# Patient Record
Sex: Female | Born: 1980 | Race: Black or African American | Hispanic: No | Marital: Single | Smoking: Former smoker
Health system: Southern US, Community
[De-identification: ages and names within clinical notes are randomized; demographics above are authoritative.]

## PROBLEM LIST (undated history)

## (undated) DIAGNOSIS — F419 Anxiety disorder, unspecified: Secondary | ICD-10-CM

## (undated) DIAGNOSIS — F32A Depression, unspecified: Secondary | ICD-10-CM

## (undated) DIAGNOSIS — I10 Essential (primary) hypertension: Secondary | ICD-10-CM

## (undated) DIAGNOSIS — M1990 Unspecified osteoarthritis, unspecified site: Secondary | ICD-10-CM

## (undated) DIAGNOSIS — D649 Anemia, unspecified: Secondary | ICD-10-CM

## (undated) DIAGNOSIS — K219 Gastro-esophageal reflux disease without esophagitis: Secondary | ICD-10-CM

## (undated) DIAGNOSIS — G4730 Sleep apnea, unspecified: Secondary | ICD-10-CM

## (undated) HISTORY — PX: WISDOM TOOTH EXTRACTION: SHX21

## (undated) HISTORY — PX: CHOLECYSTECTOMY: SHX55

## (undated) HISTORY — PX: TUBAL LIGATION: SHX77

---

## 2004-01-30 ENCOUNTER — Emergency Department: Payer: Self-pay | Admitting: Emergency Medicine

## 2004-02-10 ENCOUNTER — Emergency Department: Payer: Self-pay | Admitting: Emergency Medicine

## 2004-04-25 ENCOUNTER — Emergency Department: Payer: Self-pay | Admitting: Emergency Medicine

## 2004-05-12 ENCOUNTER — Inpatient Hospital Stay: Payer: Self-pay | Admitting: Obstetrics and Gynecology

## 2005-09-05 ENCOUNTER — Emergency Department: Payer: Self-pay | Admitting: Emergency Medicine

## 2005-09-16 ENCOUNTER — Emergency Department (HOSPITAL_COMMUNITY): Admission: EM | Admit: 2005-09-16 | Discharge: 2005-09-16 | Payer: Self-pay | Admitting: Emergency Medicine

## 2006-02-05 ENCOUNTER — Encounter: Payer: Self-pay | Admitting: Maternal & Fetal Medicine

## 2006-02-19 ENCOUNTER — Encounter: Payer: Self-pay | Admitting: Maternal & Fetal Medicine

## 2006-02-23 ENCOUNTER — Emergency Department: Payer: Self-pay | Admitting: Emergency Medicine

## 2006-02-24 ENCOUNTER — Other Ambulatory Visit: Payer: Self-pay

## 2006-03-05 ENCOUNTER — Encounter: Payer: Self-pay | Admitting: Maternal & Fetal Medicine

## 2006-06-30 ENCOUNTER — Observation Stay: Payer: Self-pay

## 2006-07-22 ENCOUNTER — Inpatient Hospital Stay: Payer: Self-pay

## 2006-10-14 ENCOUNTER — Ambulatory Visit: Payer: Self-pay | Admitting: Vascular Surgery

## 2007-05-24 ENCOUNTER — Other Ambulatory Visit: Payer: Self-pay

## 2007-05-24 ENCOUNTER — Emergency Department: Payer: Self-pay | Admitting: Emergency Medicine

## 2007-05-26 ENCOUNTER — Emergency Department: Payer: Self-pay | Admitting: Emergency Medicine

## 2008-07-22 ENCOUNTER — Emergency Department: Payer: Self-pay | Admitting: Emergency Medicine

## 2008-07-23 ENCOUNTER — Emergency Department: Payer: Self-pay | Admitting: Emergency Medicine

## 2008-10-07 ENCOUNTER — Emergency Department: Payer: Self-pay | Admitting: Emergency Medicine

## 2009-11-01 ENCOUNTER — Ambulatory Visit: Payer: Self-pay | Admitting: Internal Medicine

## 2010-01-03 ENCOUNTER — Encounter: Payer: Self-pay | Admitting: Rheumatology

## 2010-01-06 ENCOUNTER — Encounter: Payer: Self-pay | Admitting: Rheumatology

## 2010-02-06 ENCOUNTER — Encounter: Payer: Self-pay | Admitting: Rheumatology

## 2010-08-02 ENCOUNTER — Emergency Department: Payer: Self-pay | Admitting: Emergency Medicine

## 2011-01-03 ENCOUNTER — Emergency Department: Payer: Self-pay | Admitting: Emergency Medicine

## 2011-06-24 ENCOUNTER — Emergency Department: Payer: Self-pay | Admitting: Emergency Medicine

## 2011-06-24 LAB — URINALYSIS, COMPLETE
Blood: NEGATIVE
Glucose,UR: NEGATIVE mg/dL (ref 0–75)
Ph: 5 (ref 4.5–8.0)
Protein: 30
RBC,UR: 5 /HPF (ref 0–5)
Specific Gravity: 1.026 (ref 1.003–1.030)
Squamous Epithelial: NONE SEEN
WBC UR: 29 /HPF (ref 0–5)

## 2011-06-24 LAB — COMPREHENSIVE METABOLIC PANEL
Albumin: 3.4 g/dL (ref 3.4–5.0)
BUN: 7 mg/dL (ref 7–18)
Bilirubin,Total: 0.2 mg/dL (ref 0.2–1.0)
Calcium, Total: 9 mg/dL (ref 8.5–10.1)
Co2: 22 mmol/L (ref 21–32)
EGFR (Non-African Amer.): >60
Glucose: 85 mg/dL (ref 65–99)
Potassium: 3.4 mmol/L — ABNORMAL LOW (ref 3.5–5.1)
Total Protein: 7.4 g/dL (ref 6.4–8.2)

## 2011-06-24 LAB — CBC
HCT: 33.8 % — ABNORMAL LOW (ref 35.0–47.0)
MCH: 28.6 pg (ref 26.0–34.0)
MCHC: 33.2 g/dL (ref 32.0–36.0)
MCV: 86 fL (ref 80–100)
RDW: 14.2 % (ref 11.5–14.5)
WBC: 8 10*3/uL (ref 3.6–11.0)

## 2011-07-25 ENCOUNTER — Emergency Department: Payer: Self-pay | Admitting: *Deleted

## 2011-07-25 LAB — URINALYSIS, COMPLETE
Bilirubin,UR: NEGATIVE
Blood: NEGATIVE
Glucose,UR: NEGATIVE mg/dL (ref 0–75)
Protein: NEGATIVE
Specific Gravity: 1.02 (ref 1.003–1.030)

## 2011-11-15 ENCOUNTER — Emergency Department: Payer: Self-pay | Admitting: Emergency Medicine

## 2011-12-11 ENCOUNTER — Inpatient Hospital Stay: Payer: Self-pay | Admitting: Obstetrics and Gynecology

## 2011-12-11 LAB — PIH PROFILE
Anion Gap: 10 (ref 7–16)
Co2: 21 mmol/L (ref 21–32)
EGFR (African American): >60
MCH: 29.4 pg (ref 26.0–34.0)
MCHC: 34 g/dL (ref 32.0–36.0)
Platelet: 253 10*3/uL (ref 150–440)
RDW: 14.3 % (ref 11.5–14.5)
Uric Acid: 3.9 mg/dL (ref 2.6–6.0)

## 2011-12-11 LAB — PROTEIN / CREATININE RATIO, URINE
Protein, Random Urine: 71 mg/dL — ABNORMAL HIGH (ref 0–12)
Protein/Creat. Ratio: 288 mg/gCREAT — ABNORMAL HIGH (ref 0–200)

## 2011-12-12 LAB — CBC WITH DIFFERENTIAL/PLATELET
Basophil %: 0.2 %
Eosinophil %: 1.9 %
HGB: 10.8 g/dL — ABNORMAL LOW (ref 12.0–16.0)
Lymphocyte #: 2.3 10*3/uL (ref 1.0–3.6)
MCH: 28.6 pg (ref 26.0–34.0)
MCHC: 33 g/dL (ref 32.0–36.0)
MCV: 87 fL (ref 80–100)
Monocyte #: 0.4 x10 3/mm (ref 0.2–0.9)
RBC: 3.77 10*6/uL — ABNORMAL LOW (ref 3.80–5.20)

## 2011-12-14 LAB — PATHOLOGY REPORT

## 2013-08-28 ENCOUNTER — Emergency Department: Payer: Self-pay | Admitting: Emergency Medicine

## 2014-02-09 ENCOUNTER — Emergency Department: Payer: Self-pay | Admitting: Emergency Medicine

## 2014-05-26 NOTE — Op Note (Signed)
PATIENT NAME:  Anna Barker, Anna Barker MR#:  [id number] DATE OF BIRTH:  06-28-1980  DATE OF PROCEDURE:  12/12/2011  PREOPERATIVE DIAGNOSES:  1. Intrauterine pregnancy at 10336 weeks gestational age.  2. Oligohydramnios with AFI of 3 cm.  3. Chronic hypertension. 4. Desires permanent sterilization.   POSTOPERATIVE DIAGNOSES:  1. Intrauterine pregnancy at 6436 weeks gestational age.  2. Oligohydramnios with AFI of 3 cm.  3. Chronic hypertension. 4. Desires permanent sterilization.   PROCEDURES:  1. Primary low transverse cesarean section via Pfannenstiel incision.  2. Bilateral tubal ligation via Parkland method.   SURGEON: Thomasene MohairStephen Khameron Gruenwald, MD  ASSISTANT: Senaida LangeLashawn Weaver Lee, MD  ANESTHESIA: Spinal.   ESTIMATED BLOOD LOSS: 500 mL.  OPERATIVE FLUIDS: 2000 mL crystalloid.   COMPLICATIONS: None.   FINDINGS:  1. Normal appearing gravid uterus, fallopian tubes, and ovaries.  2. Viable female infant in breech presentation.   SPECIMENS: Portion of right and left fallopian tubes.   CONDITION AT END OF PROCEDURE: Stable.   INDICATION FOR PROCEDURE: [name] is a 34 year old 34, P2-0-2-2 female who has [facility] due to a history of chronic hypertension. For this she was receiving regular ultrasounds with growth scans as well as amnio-fluid index levels. The day before the surgery, she was noted to have an AFI of approximately 3 cm. She was sent to the hospital for further monitoring. Her blood pressures were somewhat elevated at first but then leveled off to her normal level. However, after hydration overnight, her  AFI remained approximately the same, at 2.6 cm upon recheck today. Given her oligohydramnios and chronic hypertension and her gestational age of [redacted] weeks, it was decided that she should be delivered. Given her breech presentation, discussion was held regarding external cephalic version. Decision was made not to proceed and she was therefore  taken to the operating room for abdominal delivery.   PROCEDURE IN DETAIL: After the procedure was reviewed with the patient and the risks and benefits of the procedure were explained, questions were answered. Additionally, the patient wished to have a tubal ligation and the risks and benefits as well as alternatives were discussed with the patient and she wished to proceed. She therefore was taken to the operating room. In the operating room, she was administered spinal anesthesia which was found to be adequate. She was placed in the dorsal supine position with a leftward tilt. She was prepped and draped in the usual sterile fashion. After time out was called, a Pfannenstiel incision was made and carried through the various layers until the peritoneum was identified and entered sharply. The peritoneum was extended in cranial and caudal directions. A bladder flap was then created and the bladder retractor was placed to pull the bladder out of the operative area of interest. A low transverse hysterotomy was then made and was extended laterally with both cranial and caudal tension. The fetal breech was then grasped and delivered in the usual fashion using a moist blue towel and the body followed by the shoulders and the head delivered without difficulty. The cord was clamped and cut and the infant was handed to the awaiting pediatrician. Cord blood was collected. The placenta was then removed and the uterus was cleared of all clots and debris. The uterus was exteriorized at this point. The hysterotomy was closed using a #0 Vicryl in a running locked fashion. A second layer of the same suture was used to obtain hemostasis.   Attention was turned to the right  fallopian tube where a 3 cm segment, approximately 3 cm from the cornual region, was identified, grasped with a Babcock clamp, was suture ligated, and approximately a 3 cm segment was removed. Hemostasis was noted. Of note, two ties using #0 plain gut were used  to achieve hemostasis. The Parkland method was used for the tubal ligation. The same procedure was carried out on the left fallopian tube with the same results and hemostasis was noted. The incision was again reinspected and then the uterus was returned to the abdomen. The gutters were cleared of all clots and debris. The peritoneum was then closed using a large, loosely tied figure-of-eight with a #2-0 Vicryl.   After ensuring hemostasis on the rectus abdominis muscle bellies, the On-Q system was then placed according to the manufacturer's specifications. The catheters were inserted approximately 4 cm cephalad to the incision and approximately 2 cm apart straddling the midline. They were inserted to a level just superficial to the rectus abdominis muscles and just deep to the rectus abdominis fascia. They were inserted to a level of the third mark on the catheters. They were then placed in the abdominal muscle bellies as to be avoided during closure of the fascia.   Fascia was then closed using #0 Maxon using two separate sutures both starting at the lateral apices and meeting in the midline and were tied together in the midline. The skin was closed using #4-0 Vicryl, undyed, in a running subcuticular fashion. It was covered with Steri-Strips and benzoin and a pressure dressing. The On-Q catheters were affixed to the abdomen using Dermabond and Steri-Strips as well as Tegaderm.   The patient tolerated the procedure well. Sponge, lap, and needle counts were correct x2. For VTE prophylaxis, the patient was wearing pneumatic compression stockings which were in place and operating throughout the entire procedure. For antibiotic prophylaxis, she received 3 grams of Ancef prior to skin incision.  ____________________________ Conard Novak, MD sdj:slb D: 12/12/2011 18:03:41 ET T: 12/13/2011 08:52:07 ET JOB#: [id number]  cc: Conard Novak, MD, <Dictator> Conard Novak MD ELECTRONICALLY SIGNED  12/27/2011 15:46

## 2014-06-16 NOTE — H&P (Signed)
L&D Evaluation:  History:   HPI 34 year old BF G4 P2022 with EDC=01/06/2012 by LMP=08/07/2011 presents from office at 36 2/7 weeks with elevated blood pressures in office of 156/106 and frontal headache. Patient has CHTN and is currently on Aldomet 250 mgm BID. An ultrasound today revealed a AFI of 3.12cm and baby is in breech presentation. She presents for a PIH evaluation and IV hydration. She has been having migraine type headaches that have been somewhat relieved with Fioricet. She has been having AM nausea and vomiting. Her vision blurs with her headaches. Denies CP, SOB, VB, LOF. Having mild irregular contractions. Floowed with weekly NSTs which have been reactive and AFIs that were normal until today. One pound weight loss since last week. Has gained more than31# with pregnancy. EFW on 10/10 was in the 43.4%    Presents with elevated BP, headache, oligo, and breech presentation    Patient's Medical History Hypertension  migraines, reflux, abnormal Pap smears.    Patient's Surgical History Cholecystectomy    Medications Pre Natal Vitamins  Tylenol (Acetaminophen)  Fioricet, Aldomet 250 mgm BID, pantoprazole 40 mgm  daily    Allergies NKDA    Social History none    Family History Non-Contributory   ROS:   ROS see HPI   Exam:   Vital Signs 131/84, 157/85, 110/86, 143/88, 151/106, 148/74    Urine Protein P/C ratio=288mg m    General uncomfortable with back pain and headache    Mental Status clear    Chest clear    Heart normal sinus rhythm, no murmur/gallop/rubs    Abdomen gravid, non-tender    Estimated Fetal Weight Average for gestational age    Fetal Position breech    Edema 1+    Reflexes 1+    Mebranes Intact    FHT normal rate with no decels, 135 with accels to 160    Ucx irregular, mild    Other PIH labs: BUN/cr=7/0.39, SGOT WNL, uric acid=3.9cm, H&H=11.7/34.3%, plt=253K   Impression:   Impression IUP at 36 + weeks with oligo. Breech presentation. CHTN  with no evidence of preeclampsia.   Plan:   Plan EFM/NST, monitor BP, IV hydration and repeat AFI in AM. Tylenol for pain. Regular diet.   Electronic Signatures: Trinna BalloonGutierrez, Elman Dettman L (CNM)  (Signed [phone]-Nov-13 01:06)  Authored: L&D Evaluation   Last Updated: 05-Nov-13 01:06 by Trinna BalloonGutierrez, Hjalmer Iovino L (CNM)

## 2014-10-17 ENCOUNTER — Encounter: Payer: Self-pay | Admitting: *Deleted

## 2014-10-17 ENCOUNTER — Emergency Department
Admission: EM | Admit: 2014-10-17 | Discharge: 2014-10-17 | Discharge status: Against Medical Advice | Payer: Self-pay | Attending: Emergency Medicine | Admitting: Emergency Medicine

## 2014-10-17 DIAGNOSIS — I10 Essential (primary) hypertension: Secondary | ICD-10-CM | POA: Insufficient documentation

## 2014-10-17 DIAGNOSIS — S61012A Laceration without foreign body of left thumb without damage to nail, initial encounter: Secondary | ICD-10-CM | POA: Insufficient documentation

## 2014-10-17 DIAGNOSIS — Y288XXA Contact with other sharp object, undetermined intent, initial encounter: Secondary | ICD-10-CM | POA: Insufficient documentation

## 2014-10-17 DIAGNOSIS — Y9389 Activity, other specified: Secondary | ICD-10-CM | POA: Insufficient documentation

## 2014-10-17 DIAGNOSIS — Y9289 Other specified places as the place of occurrence of the external cause: Secondary | ICD-10-CM | POA: Insufficient documentation

## 2014-10-17 DIAGNOSIS — Y990 Civilian activity done for income or pay: Secondary | ICD-10-CM | POA: Insufficient documentation

## 2014-10-17 HISTORY — DX: Essential (primary) hypertension: I10

## 2014-10-17 NOTE — ED Notes (Signed)
Pt was at work and cut the pad of her thumb on her left hand with a blade. Bleeding controlled on arrival to triage.

## 2014-10-17 NOTE — ED Notes (Signed)
Pt does not want to file workers comp.  

## 2014-12-03 ENCOUNTER — Emergency Department
Admission: EM | Admit: 2014-12-03 | Discharge: 2014-12-03 | Disposition: A | Discharge status: Home / Self Care | Payer: Self-pay | Attending: Emergency Medicine | Admitting: Emergency Medicine

## 2014-12-03 ENCOUNTER — Encounter: Payer: Self-pay | Admitting: Emergency Medicine

## 2014-12-03 ENCOUNTER — Emergency Department: Payer: Self-pay

## 2014-12-03 DIAGNOSIS — S8392XA Sprain of unspecified site of left knee, initial encounter: Secondary | ICD-10-CM | POA: Insufficient documentation

## 2014-12-03 DIAGNOSIS — Y9389 Activity, other specified: Secondary | ICD-10-CM | POA: Insufficient documentation

## 2014-12-03 DIAGNOSIS — I10 Essential (primary) hypertension: Secondary | ICD-10-CM | POA: Insufficient documentation

## 2014-12-03 DIAGNOSIS — Y990 Civilian activity done for income or pay: Secondary | ICD-10-CM | POA: Insufficient documentation

## 2014-12-03 DIAGNOSIS — Y9289 Other specified places as the place of occurrence of the external cause: Secondary | ICD-10-CM | POA: Insufficient documentation

## 2014-12-03 DIAGNOSIS — E663 Overweight: Secondary | ICD-10-CM | POA: Insufficient documentation

## 2014-12-03 DIAGNOSIS — W228XXA Striking against or struck by other objects, initial encounter: Secondary | ICD-10-CM | POA: Insufficient documentation

## 2014-12-03 MED ORDER — MELOXICAM 15 MG PO TABS
15.0000 mg | ORAL_TABLET | Freq: Every day | ORAL | Status: DC
Start: 2014-12-03 — End: 2015-05-28

## 2014-12-03 MED ORDER — NAPROXEN 500 MG PO TABS
500.0000 mg | ORAL_TABLET | Freq: Once | ORAL | Status: AC
  Administered 2014-12-03: 500 mg via ORAL
  Filled 2014-12-03: qty 1

## 2014-12-03 MED ORDER — TRAMADOL HCL 50 MG PO TABS: 50.0000 mg | ORAL_TABLET | Freq: Four times a day (QID) | ORAL | Status: DC | PRN

## 2014-12-03 NOTE — Discharge Instructions (Signed)
Wear knee support for 3-5 days as needed. How to Use a Knee Brace A knee brace is a device that you wear to support your knee, especially if the knee is healing after an injury or surgery. There are several types of knee braces. Some are designed to prevent an injury (prophylactic brace). These are often worn during sports. Others support an injured knee (functional brace) or keep it still while it heals (rehabilitative brace). People with severe arthritis of the knee may benefit from a brace that takes some pressure off the knee (unloader brace). Most knee braces are made from a combination of cloth and metal or plastic.  You may need to wear a knee brace to:  Relieve knee pain.  Help your knee support your weight (improve stability).  Help you walk farther (improve mobility).  Prevent injury.  Support your knee while it heals from surgery or from an injury. RISKS AND COMPLICATIONS Generally, knee braces are very safe to wear. However, problems may occur, including:  Skin irritation that may lead to infection.  Making your condition worse if you wear the brace in the wrong way. HOW TO USE A KNEE BRACE Different braces will have different instructions for use. Your health care provider will tell you or show you:  How to put on your brace.  How to adjust the brace.  When and how often to wear the brace.  How to remove the brace.  If you will need any assistive devices in addition to the brace, such as crutches or a cane. In general, your brace should:  Have the hinge of the brace line up with the bend of your knee.  Have straps, hooks, or tapes that fasten snugly around your leg.  Not feel too tight or too loose. HOW TO CARE FOR A KNEE BRACE  Check your brace often for signs of damage, such as loose connections or attachments. Your knee brace may get damaged or wear out during normal use.  Wash the fabric parts of your brace with soap and water.  Read the insert that  comes with your brace for other specific care instructions. SEEK MEDICAL CARE IF:  Your knee brace is too loose or too tight and you cannot adjust it.  Your knee brace causes skin redness, swelling, bruising, or irritation.  Your knee brace is not helping.  Your knee brace is making your knee pain worse.   This information is not intended to replace advice given to you by your health care provider. Make sure you discuss any questions you have with your health care provider.   Document Released: 04/15/2003 Document Revised: 10/14/2014 Document Reviewed: 05/18/2014 Elsevier Interactive Patient Education [employer]2016 Elsevier Inc.

## 2014-12-03 NOTE — ED Provider Notes (Signed)
[facility] Emergency Department Provider Note  ____________________________________________  Time seen: Approximately 6:36 PM  I have reviewed the triage vital signs and the nursing notes.   HISTORY  Chief Complaint Knee Pain    HPI Anna Barker is a 34 y.o. female patient complaining of left knee pain and edema secondary to a twisting incident last night at work. States she was sitting in a chair, she rolled the chair back and ankle hit a sharp object causing her to leave from a chair twisted her left knee. Patient states today there is pain which increased with ambulation and more edema is present. No palliative measures taken for this complaint. States is rating the pain as 8/10.   Past Medical History  Diagnosis Date  . Hypertension     There are no active problems to display for this patient.   Past Surgical History  Procedure Laterality Date  . Cholecystectomy      No current outpatient prescriptions on file.  Allergies Review of patient's allergies indicates no known allergies.  No family history on file.  Social History Social History  Substance Use Topics  . Smoking status: Never Smoker   . Smokeless tobacco: None  . Alcohol Use: No    Review of Systems Constitutional: No fever/chills Eyes: No visual changes. ENT: No sore throat. Cardiovascular: Denies chest pain. Respiratory: Denies shortness of breath. Gastrointestinal: No abdominal pain.  No nausea, no vomiting.  No diarrhea.  No constipation. Genitourinary: Negative for dysuria. Musculoskeletal: Left knee pain and edema. Skin: Negative for rash. Neurological: Negative for headaches, focal weakness or numbness. Endocrine: Hypertension  10-point ROS otherwise negative.  ____________________________________________   PHYSICAL EXAM:  VITAL SIGNS: ED Triage Vitals  Enc Vitals Group     BP 12/03/14 1822 157/97 mmHg     Pulse Rate 12/03/14 1822 91     Resp  12/03/14 1822 15     Temp 12/03/14 1822 98.4 F (36.9 C)     Temp Source 12/03/14 1822 Oral     SpO2 12/03/14 1822 99 %     Weight 12/03/14 1812 260 lb (117.935 kg)     Height 12/03/14 1812 5\' 7"  (1.702 m)     Head Cir --      Peak Flow --      Pain Score 12/03/14 1812 8     Pain Loc --      Pain Edu? --      Excl. in GC? --     Constitutional: Alert and oriented. Well appearing and in no acute distress. Ovewrweight. Eyes: Conjunctivae are normal. PERRL. EOMI. Head: Atraumatic. Nose: No congestion/rhinnorhea. Mouth/Throat: Mucous membranes are moist.  Oropharynx non-erythematous. Neck: No stridor.  No cervical spine tenderness to palpation. Hematological/Lymphatic/Immunilogical: No cervical lymphadenopathy. Cardiovascular: Normal rate, regular rhythm. Grossly normal heart sounds.  Good peripheral circulation. Respiratory: Normal respiratory effort.  No retractions. Lungs CTAB. Gastrointestinal: Soft and nontender. No distention. No abdominal bruits. No CVA tenderness. Musculoskeletal: No obvious deformity to the left knee. All the edema lateral patella. Crease range of motion with flexion, complaining of pain.  Skin:  Skin is warm, dry and intact. No rash noted. Psychiatric: Mood and affect are normal. Speech and behavior are normal.  ____________________________________________   LABS (all labs ordered are listed, but only abnormal results are displayed)  Labs Reviewed - No data to display ____________________________________________  EKG   ____________________________________________  RADIOLOGY  No acute findings on x-ray. ____________________________________________   PROCEDURES  Procedure(s) performed: None  Critical Care performed: No  ____________________________________________   INITIAL IMPRESSION / ASSESSMENT AND PLAN / ED COURSE  Pertinent labs & imaging results that were available during my care of the patient were reviewed by me and considered in  my medical decision making (see chart for details).  Sprain left knee. Discussed x-ray findings with patient. Patient placed in a knee immobilizer given a prescription for Motrin and tramadol. Patient advised [facility] condition [facility]. ____________________________________________   FINAL CLINICAL IMPRESSION(S) / ED DIAGNOSES  Final diagnoses:  Sprain of left knee, initial encounter      Joni Reining, PA-C 12/03/14 1922  Jene Every, MD 12/03/14 2120

## 2014-12-03 NOTE — ED Notes (Signed)
States she was sitting in a chair at work,rolled back and something sharp hit her left ankle..then she twisted her knee   Min swelling noted to knee

## 2014-12-03 NOTE — ED Notes (Signed)
Pt with left knee pain started last night, now ankle is swollen.

## 2015-05-28 ENCOUNTER — Emergency Department: Payer: Self-pay

## 2015-05-28 ENCOUNTER — Emergency Department
Admission: EM | Admit: 2015-05-28 | Discharge: 2015-05-28 | Disposition: A | Discharge status: Home / Self Care | Payer: Self-pay | Attending: Emergency Medicine | Admitting: Emergency Medicine

## 2015-05-28 ENCOUNTER — Encounter: Payer: Self-pay | Admitting: Emergency Medicine

## 2015-05-28 DIAGNOSIS — W010XXA Fall on same level from slipping, tripping and stumbling without subsequent striking against object, initial encounter: Secondary | ICD-10-CM | POA: Insufficient documentation

## 2015-05-28 DIAGNOSIS — I10 Essential (primary) hypertension: Secondary | ICD-10-CM | POA: Insufficient documentation

## 2015-05-28 DIAGNOSIS — Y939 Activity, unspecified: Secondary | ICD-10-CM | POA: Insufficient documentation

## 2015-05-28 DIAGNOSIS — Y999 Unspecified external cause status: Secondary | ICD-10-CM | POA: Insufficient documentation

## 2015-05-28 DIAGNOSIS — M1712 Unilateral primary osteoarthritis, left knee: Secondary | ICD-10-CM

## 2015-05-28 DIAGNOSIS — M13862 Other specified arthritis, left knee: Secondary | ICD-10-CM | POA: Insufficient documentation

## 2015-05-28 DIAGNOSIS — S8002XA Contusion of left knee, initial encounter: Secondary | ICD-10-CM | POA: Insufficient documentation

## 2015-05-28 DIAGNOSIS — Y92009 Unspecified place in unspecified non-institutional (private) residence as the place of occurrence of the external cause: Secondary | ICD-10-CM | POA: Insufficient documentation

## 2015-05-28 MED ORDER — NAPROXEN 500 MG PO TABS: 500.0000 mg | ORAL_TABLET | Freq: Two times a day (BID) | ORAL | Status: DC

## 2015-05-28 MED ORDER — KETOROLAC TROMETHAMINE 30 MG/ML IJ SOLN
30.0000 mg | Freq: Once | INTRAMUSCULAR | Status: AC
  Administered 2015-05-28: 30 mg via INTRAMUSCULAR
  Filled 2015-05-28: qty 1

## 2015-05-28 NOTE — Discharge Instructions (Signed)
Arthritis Arthritis means joint pain. It can also mean joint disease. A joint is a place where bones come together. People who have arthritis may have:  Red joints.  Swollen joints.  Stiff joints.  Warm joints.  A fever.  A feeling of being sick. HOME CARE Pay attention to any changes in your symptoms. Take these actions to help with your pain and swelling. Medicines  Take over-the-counter and prescription medicines only as told by your doctor.  Do not take aspirin for pain if your doctor says that you may have gout. Activities  Rest your joint if your doctor tells you to.  Avoid activities that make the pain worse.  Exercise your joint regularly as told by your doctor. Try doing exercises like:  Swimming.  Water aerobics.  Biking.  Walking. Joint Care  If your joint is swollen, keep it raised (elevated) if told by your doctor.  If your joint feels stiff in the morning, try taking a warm shower.  If you have diabetes, do not apply heat without asking your doctor.  If told, apply heat to the joint:  Put a towel between the joint and the hot pack or heating pad.  Leave the heat on the area for 20-30 minutes.  If told, apply ice to the joint:  Put ice in a plastic bag.  Place a towel between your skin and the bag.  Leave the ice on for 20 minutes, 2-3 times per day.  Keep all follow-up visits as told by your doctor. GET HELP IF:  The pain gets worse.  You have a fever. GET HELP RIGHT AWAY IF:  You have very bad pain in your joint.  You have swelling in your joint.  Your joint is red.  Many joints become painful and swollen.  You have very bad back pain.  Your leg is very weak.  You cannot control your pee (urine) or poop (stool).   This information is not intended to replace advice given to you by your health care provider. Make sure you discuss any questions you have with your health care provider.   Document Released: 04/19/2009  Document Revised: 10/14/2014 Document Reviewed: 04/20/2014 Elsevier Interactive Patient Education 2016 Elsevier Inc.  Contusion A contusion is a deep bruise. Contusions happen when an injury causes bleeding under the skin. Symptoms of bruising include pain, swelling, and discolored skin. The skin may turn blue, purple, or yellow. HOME CARE   Rest the injured area.  If told, put ice on the injured area.  Put ice in a plastic bag.  Place a towel between your skin and the bag.  Leave the ice on for 20 minutes, 2-3 times per day.  If told, put light pressure (compression) on the injured area using an elastic bandage. Make sure the bandage is not too tight. Remove it and put it back on as told by your doctor.  If possible, raise (elevate) the injured area above the level of your heart while you are sitting or lying down.  Take over-the-counter and prescription medicines only as told by your doctor. GET HELP IF:  Your symptoms do not get better after several days of treatment.  Your symptoms get worse.  You have trouble moving the injured area. GET HELP RIGHT AWAY IF:   You have very bad pain.  You have a loss of feeling (numbness) in a hand or foot.  Your hand or foot turns pale or cold.   This information is not intended to replace  advice given to you by your health care provider. Make sure you discuss any questions you have with your health care provider.   Document Released: 07/12/2007 Document Revised: 10/14/2014 Document Reviewed: 06/10/2014 Elsevier Interactive Patient Education 2016 Elsevier Inc.  Cryotherapy Cryotherapy is when you put ice on your injury. Ice helps lessen pain and puffiness (swelling) after an injury. Ice works the best when you start using it in the first 24 to 48 hours after an injury. HOME CARE  Put a dry or damp towel between the ice pack and your skin.  You may press gently on the ice pack.  Leave the ice on for no more than 10 to 20  minutes at a time.  Check your skin after 5 minutes to make sure your skin is okay.  Rest at least 20 minutes between ice pack uses.  Stop using ice when your skin loses feeling (numbness).  Do not use ice on someone who cannot tell you when it hurts. This includes small children and people with memory problems (dementia). GET HELP RIGHT AWAY IF:  You have white spots on your skin.  Your skin turns blue or pale.  Your skin feels waxy or hard.  Your puffiness gets worse. MAKE SURE YOU:   Understand these instructions.  Will watch your condition.  Will get help right away if you are not doing well or get worse.   This information is not intended to replace advice given to you by your health care provider. Make sure you discuss any questions you have with your health care provider.   Document Released: 07/12/2007 Document Revised: 04/17/2011 Document Reviewed: 09/15/2010 Elsevier Interactive Patient Education [employer] Else[employer].

## 2015-05-28 NOTE — ED Provider Notes (Signed)
[facility] Emergency Department Provider Note  ____________________________________________  Time seen: Approximately 1:23 PM  I have reviewed the triage vital signs and the nursing notes.   HISTORY  Chief Complaint Knee Injury    HPI Anna Barker is a 35 y.o. female , NAD, presents to the emergency department with one-day history of left knee pain after a fall. States she slipped and fell in her home onto the front of her knee today. Has had anterior and lateral pain about the knee since that time. Notes she does have a history of arthritis. Denies head injury during the fall. Denies any numbness, weakness, tingling. Denies any open wounds or lacerations. Has noted some mild swelling to the left knee since the fall. Has not taken anything for her pain at this time. Pain worsens with weightbearing and standing. No pain while sitting or resting.   Past Medical History  Diagnosis Date  . Hypertension     There are no active problems to display for this patient.   Past Surgical History  Procedure Laterality Date  . Cholecystectomy      Current Outpatient Rx  Name  Route  Sig  Dispense  Refill  . naproxen (NAPROSYN) 500 MG tablet   Oral   Take 1 tablet (500 mg total) by mouth 2 (two) times daily with a meal.   14 tablet   0     Allergies Review of patient's allergies indicates no known allergies.  No family history on file.  Social History Social History  Substance Use Topics  . Smoking status: Never Smoker   . Smokeless tobacco: None  . Alcohol Use: No     Review of Systems  Constitutional: No fever/chills, fatigue Cardiovascular: No chest pain. Respiratory: No shortness of breath. No wheezing.  Gastrointestinal: No abdominal pain.  No nausea, vomiting.  Musculoskeletal: Positive left knee pain. Negative for back, hip pain.  Skin: Negative for rash or redness, swelling, open wounds, lacerations. Neurological: Negative for  headaches, focal weakness or numbness. No tingling. 10-point ROS otherwise negative.  ____________________________________________   PHYSICAL EXAM:  VITAL SIGNS: ED Triage Vitals  Enc Vitals Group     BP 05/28/15 1214 134/91 mmHg     Pulse Rate 05/28/15 1214 98     Resp 05/28/15 1214 18     Temp 05/28/15 1214 97.7 F (36.5 C)     Temp Source 05/28/15 1214 Oral     SpO2 --      Weight 05/28/15 1214 260 lb (117.935 kg)     Height 05/28/15 1214 5\' 8"  (1.727 m)     Head Cir --      Peak Flow --      Pain Score 05/28/15 1216 7     Pain Loc --      Pain Edu? --      Excl. in GC? --      Constitutional: Alert and oriented. Well appearing and in no acute distress. Patient was sleeping in the chair in the exam room when I entered. Eyes: Conjunctivae are normal.  Head: Atraumatic. Cardiovascular:  Good peripheral circulation with 2+ pulses in the left lower extremity. Respiratory: Normal respiratory effort without tachypnea or retractions.  Musculoskeletal: Full range of motion of the left knee. Mild crepitus noted with patellofemoral grind. No laxity with anterior or posterior drawer. No laxity with varus or valgus stress. No lower extremity tenderness nor edema.  No joint effusions. Neurologic:  Normal speech and language. No gross focal neurologic  deficits are appreciated.  Skin:  Skin is warm, dry and intact. No rash, open wounds, redness, warmth noted. Psychiatric: Mood and affect are normal. Speech and behavior are normal. Patient exhibits appropriate insight and judgement.   ____________________________________________   LABS  None ____________________________________________  EKG  None ____________________________________________  RADIOLOGY I have personally viewed and evaluated these images (plain radiographs) as part of my medical decision making, as well as reviewing the written report by the radiologist.  Dg Knee Complete 4 Views Left  05/28/2015  CLINICAL  DATA:  Pain following fall EXAM: LEFT KNEE - COMPLETE 4+ VIEW COMPARISON:  December 03, 2014 FINDINGS: There is no appreciable fracture or dislocation. No joint effusion. There is slight spurring in all compartments. There is mild narrowing of the patellofemoral joint. No erosive change. 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 No fracture or joint effusion. Electronically Signed   By: Bretta Bang III M.D.   On: 05/28/2015 13:02    ____________________________________________    PROCEDURES  Procedure(s) performed: None      Medications  ketorolac (TORADOL) 30 MG/ML injection 30 mg (30 mg Intramuscular Given 05/28/15 1333)     ____________________________________________   INITIAL IMPRESSION / ASSESSMENT AND PLAN / ED COURSE  Pertinent imaging results that were available during my care of the patient were reviewed by me and considered in my medical decision making (see chart for details).  Patient's diagnosis is consistent with contusion of left knee with left knee arthritis. Patient will be discharged home with prescriptions for naproxen to use as directed. Patient was also placed in an Ace wrap to give stability and compression to the left knee. Patient is to follow up with [facility] if symptoms persist past this treatment course. Patient is given ED precautions to return to the ED for any worsening or new symptoms.    ----------------------------------------- 2:59 PM on 05/28/2015 -----------------------------------------  Abdomen time of discharge patient was noted to be in increased pain and crying. What she was further questioned, she states she let her child climb into her lap and sit on her left leg which caused the increase in pain. Patient was given a set of crutches to assist with ambulation that she initially declined. After monitoring for a few minutes, the patient's pain level began to  decrease.  ____________________________________________  FINAL CLINICAL IMPRESSION(S) / ED DIAGNOSES  Final diagnoses:  Contusion of left knee, initial encounter  Arthritis of left knee      NEW MEDICATIONS STARTED DURING THIS VISIT:  Discharge Medication List as of 05/28/2015  1:32 PM    START taking these medications   Details  naproxen (NAPROSYN) 500 MG tablet Take 1 tablet (500 mg total) by mouth 2 (two) times daily with a meal., Starting 05/28/2015, Until Discontinued, Print             Hope Pigeon, PA-C 05/28/15 1332  Islay Polanco L Leron Stoffers, PA-C 05/28/15 1500  Governor Rooks, MD 05/28/15 1612

## 2015-05-28 NOTE — ED Notes (Signed)
States slipped and fell on floor in home today, fell on left knee.  Fell at [employer].  C/O left knee pain.

## 2015-09-12 ENCOUNTER — Emergency Department
Admission: EM | Admit: 2015-09-12 | Discharge: 2015-09-12 | Disposition: A | Discharge status: Home / Self Care | Payer: Self-pay | Attending: Emergency Medicine | Admitting: Emergency Medicine

## 2015-09-12 ENCOUNTER — Encounter: Payer: Self-pay | Admitting: Emergency Medicine

## 2015-09-12 ENCOUNTER — Emergency Department: Payer: Self-pay

## 2015-09-12 DIAGNOSIS — Y939 Activity, unspecified: Secondary | ICD-10-CM | POA: Insufficient documentation

## 2015-09-12 DIAGNOSIS — Y929 Unspecified place or not applicable: Secondary | ICD-10-CM | POA: Insufficient documentation

## 2015-09-12 DIAGNOSIS — Y999 Unspecified external cause status: Secondary | ICD-10-CM | POA: Insufficient documentation

## 2015-09-12 DIAGNOSIS — R52 Pain, unspecified: Secondary | ICD-10-CM

## 2015-09-12 DIAGNOSIS — S90121A Contusion of right lesser toe(s) without damage to nail, initial encounter: Secondary | ICD-10-CM | POA: Insufficient documentation

## 2015-09-12 DIAGNOSIS — W228XXA Striking against or struck by other objects, initial encounter: Secondary | ICD-10-CM | POA: Insufficient documentation

## 2015-09-12 DIAGNOSIS — I10 Essential (primary) hypertension: Secondary | ICD-10-CM | POA: Insufficient documentation

## 2015-09-12 MED ORDER — IBUPROFEN 600 MG PO TABS: 600.0000 mg | ORAL_TABLET | Freq: Three times a day (TID) | ORAL | 0 refills | Status: DC | PRN

## 2015-09-12 NOTE — ED Notes (Signed)
See triage note  States she stubbed her right 5th toe on a vacuum cleaner on Friday  Having increased pain with swelling to same area

## 2015-09-12 NOTE — ED Triage Notes (Signed)
Pt stubbed pinky toe on right foot Friday. C/o pain.

## 2015-09-12 NOTE — Discharge Instructions (Signed)
Began taking ibuprofen 600 mg 3 times a day with food as needed for contusion of your toe. Follow-up with Dr. Orland Jarredroxler if any continued problems with your toe in 1-2 weeks. Continue buddy tape and using wooden shoe for support. Also [facility] or one of the clinics listed on your papers for treatment of your elevated blood pressure.

## 2015-09-12 NOTE — ED Provider Notes (Signed)
[facility] Emergency Department Provider Note   ____________________________________________   First MD Initiated Contact with Patient 09/12/15 [phone]     (approximate)  I have reviewed the triage vital signs and the nursing notes.   HISTORY  Chief Complaint Foot Pain    HPI Anna Barker is a 35 y.o. female is here with complaint of right fifth toe pain. Patient states that she hit her fifth toe on a metal vacuum cleaner on Friday night. Patient states that she is continued to have pain with swelling in the same area since that time. Patient has taken [facility] once during this time. She states that last evening she works 2 jobs and had extreme pain with walking. She denies any previous injury to her toe.  Patient rates her pain as 8 out of 10 at this time. Currently patient is not taking any medication for her blood pressure but is aware that she does have hypertension and has taken medication in the past.   Past Medical History:  Diagnosis Date  . Hypertension     There are no active problems to display for this patient.   Past Surgical History:  Procedure Laterality Date  . CHOLECYSTECTOMY      Prior to Admission medications   Medication Sig Start Date End Date Taking? Authorizing Provider  ibuprofen (ADVIL,MOTRIN) 600 MG tablet Take 1 tablet (600 mg total) by mouth every 8 (eight) hours as needed. 09/12/15   Tommi Rumps, PA-C    Allergies Review of patient's allergies indicates no known allergies.  History reviewed. No pertinent family history.  Social History Social History  Substance Use Topics  . Smoking status: Never Smoker  . Smokeless tobacco: Not on file  . Alcohol use No    Review of Systems Constitutional: No fever/chills Cardiovascular: Denies chest pain. Respiratory: Denies shortness of breath. Gastrointestinal:  No nausea, no vomiting.   Musculoskeletal: Negative for back pain.  Positive for right fifth toe pain. Skin:  Negative for rash. Neurological: Negative for headaches, focal weakness or numbness.  10-point ROS otherwise negative.  ____________________________________________   PHYSICAL EXAM:  VITAL SIGNS: ED Triage Vitals  Enc Vitals Group     BP 09/12/15 0947 (!) 148/101     Pulse Rate 09/12/15 0947 72     Resp 09/12/15 0947 18     Temp 09/12/15 0947 97.7 F (36.5 C)     Temp Source 09/12/15 0947 Oral     SpO2 09/12/15 0947 100 %     Weight 09/12/15 0946 260 lb (117.9 kg)     Height 09/12/15 0946  (1.702 m)     Head Circumference --      Peak Flow --      Pain Score 09/12/15 0946 8     Pain Loc --      Pain Edu? --      Excl. in GC? --     Constitutional: Alert and oriented. Well appearing and in no acute distress. Eyes: Conjunctivae are normal. PERRL. EOMI. Head: Atraumatic. Nose: No congestion/rhinnorhea. Neck: No stridor.   Cardiovascular: Normal rate, regular rhythm. Grossly normal heart sounds.  Good peripheral circulation. Respiratory: Normal respiratory effort.  No retractions. Lungs CTAB. Musculoskeletal: On examination the right foot there is no gross deformity however there is moderate tenderness on palpation of the right fifth toe. There is some soft tissue swelling present. There is no ecchymosis or abrasions noted. Skin is intact. Motor sensory function intact. Neurologic:  Normal speech  and language. No gross focal neurologic deficits are appreciated.  Skin:  Skin is warm, dry and intact.  Psychiatric: Mood and affect are normal. Speech and behavior are normal.  ____________________________________________   LABS (all labs ordered are listed, but only abnormal results are displayed)  Labs Reviewed - No data to display  RADIOLOGY  Right fifth toe per radiologist negative for fracture. I, Tommi Rumpshonda L Cailyn Houdek, personally viewed and evaluated these images (plain radiographs) as part of my medical decision making, as well as reviewing the written report by the  radiologist. ____________________________________________   PROCEDURES  Procedure(s) performed: None  Procedures  Critical Care performed: No  ____________________________________________   INITIAL IMPRESSION / ASSESSMENT AND PLAN / ED COURSE  Pertinent labs & imaging results that were available during my care of the patient were reviewed by me and considered in my medical decision making (see chart for details).    Clinical Course   Buddy tape was used to tape the fourth and fifth toe together and patient was placed in a postop shoe. Patient is follow-up with her primary care doctor if any continued problems and also for evaluation of her hypertension and medication. Patient states she is not actually have a PCP anymore therefore she was given multiple clinics in which she could follow up. These included [facility], [facility], [facility], [facility].  ____________________________________________   FINAL CLINICAL IMPRESSION(S) / ED DIAGNOSES  Final diagnoses:  Pain  Contusion, toe, right, initial encounter  Hypertension, uncontrolled      NEW MEDICATIONS STARTED DURING THIS VISIT:  Discharge Medication List as of 09/12/2015 11:10 AM    START taking these medications   Details  ibuprofen (ADVIL,MOTRIN) 600 MG tablet Take 1 tablet (600 mg total) by mouth every 8 (eight) hours as needed., Starting Sun 09/12/2015, Print         Note:  This document was prepared using Dragon voice recognition software and may include unintentional dictation errors.    Tommi Rumpshonda L Daisi Kentner, PA-C 09/12/15 1518    Phineas SemenGraydon Goodman, MD 09/12/15 [phone]

## 2015-11-15 ENCOUNTER — Emergency Department: Payer: Self-pay

## 2015-11-15 ENCOUNTER — Emergency Department
Admission: EM | Admit: 2015-11-15 | Discharge: 2015-11-15 | Disposition: A | Discharge status: Home / Self Care | Payer: Self-pay | Attending: Emergency Medicine | Admitting: Emergency Medicine

## 2015-11-15 DIAGNOSIS — M1812 Unilateral primary osteoarthritis of first carpometacarpal joint, left hand: Secondary | ICD-10-CM

## 2015-11-15 DIAGNOSIS — M1712 Unilateral primary osteoarthritis, left knee: Secondary | ICD-10-CM | POA: Insufficient documentation

## 2015-11-15 DIAGNOSIS — I10 Essential (primary) hypertension: Secondary | ICD-10-CM | POA: Insufficient documentation

## 2015-11-15 DIAGNOSIS — M1832 Unilateral post-traumatic osteoarthritis of first carpometacarpal joint, left hand: Secondary | ICD-10-CM | POA: Insufficient documentation

## 2015-11-15 MED ORDER — MELOXICAM 15 MG PO TABS: 15.0000 mg | ORAL_TABLET | Freq: Every day | ORAL | 0 refills | Status: DC

## 2015-11-15 NOTE — ED Triage Notes (Signed)
Left hand and left knee pain X 2 days. Pt alert and oriented X4, active, cooperative, pt in NAD. RR even and unlabored, color WNL.

## 2015-11-15 NOTE — ED Notes (Signed)
States she fell on left knee couple of days ago  conts to  Have pain and swelling to knee  also has been having pain to left hand /arm which started a few weeks ago  Denies any injury to hand

## 2015-11-15 NOTE — ED Provider Notes (Signed)
[facility] Emergency Department Provider Note ____________________________________________  Time seen: Approximately 5:29 PM  I have reviewed the triage vital signs and the nursing notes.   HISTORY  Chief Complaint Hand Pain and Knee Pain    HPI Anna Barker is a 35 y.o. female who presents to the emergency department for evaluation of left hand and knee pain post fall. She sustained a mechanical, nonsyncopal fall 2 days ago and the pain has worsened today. No relief with OTC medications.  Past Medical History:  Diagnosis Date  . Hypertension     There are no active problems to display for this patient.   Past Surgical History:  Procedure Laterality Date  . CHOLECYSTECTOMY      Prior to Admission medications   Medication Sig Start Date End Date Taking? Authorizing Provider  meloxicam (MOBIC) 15 MG tablet Take 1 tablet (15 mg total) by mouth daily. 11/15/15   Chinita Pester, FNP    Allergies Review of patient's allergies indicates no known allergies.  No family history on file.  Social History Social History  Substance Use Topics  . Smoking status: Never Smoker  . Smokeless tobacco: Not on file  . Alcohol use No    Review of Systems Constitutional: No recent illness. Cardiovascular: Denies chest pain or palpitations. Respiratory: Denies shortness of breath. Musculoskeletal: Pain in left hand and left knee. Skin: Negative for rash, wound, lesion. Neurological: Negative for focal weakness or numbness.  ____________________________________________   PHYSICAL EXAM:  VITAL SIGNS: ED Triage Vitals  Enc Vitals Group     BP 11/15/15 1650 (!) 141/98     Pulse Rate 11/15/15 1649 84     Resp 11/15/15 1649 20     Temp 11/15/15 1649 98.2 F (36.8 C)     Temp Source 11/15/15 1649 Oral     SpO2 11/15/15 1649 100 %     Weight 11/15/15 1650 250 lb (113.4 kg)     Height 11/15/15 1650 5\' 7"  (1.702 m)     Head Circumference --      Peak  Flow --      Pain Score 11/15/15 1653 6     Pain Loc --      Pain Edu? --      Excl. in GC? --     Constitutional: Alert and oriented. Well appearing and in no acute distress. Eyes: Conjunctivae are normal. EOMI. Head: Atraumatic. Neck: No stridor.  Respiratory: Normal respiratory effort.   Musculoskeletal: Full ROM of left hand. Tenderness over the MCP of the left thumb. Pain on attempt to fully extend the left leg. Straight leg raise is possible.  Neurologic:  Normal speech and language. No gross focal neurologic deficits are appreciated. Speech is normal. No gait instability. Skin:  Skin is warm, dry and intact. Atraumatic. Psychiatric: Mood and affect are normal. Speech and behavior are normal.  ____________________________________________   LABS (all labs ordered are listed, but only abnormal results are displayed)  Labs Reviewed - No data to display ____________________________________________  RADIOLOGY  Osteoarthritis noted on left hand at first [facility] joint as well as left knee per radiology. I, Kem Boroughs, personally viewed and evaluated these images (plain radiographs) as part of my medical decision making, as well as reviewing the written report by the radiologist.  ____________________________________________   PROCEDURES  Procedure(s) performed: None   ____________________________________________   INITIAL IMPRESSION / ASSESSMENT AND PLAN / ED COURSE  Clinical Course    Pertinent labs & imaging results that  were available during my care of the patient were reviewed by me and considered in my medical decision making (see chart for details).  Prescription for meloxicam written. She is to follow up with the PCP of her choice or return to the ER for symptoms that change or worsen. 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

## 2016-03-30 ENCOUNTER — Encounter: Payer: Self-pay | Admitting: *Deleted

## 2016-03-30 ENCOUNTER — Emergency Department
Admission: EM | Admit: 2016-03-30 | Discharge: 2016-03-30 | Disposition: A | Discharge status: Home / Self Care | Payer: Self-pay | Attending: Emergency Medicine | Admitting: Emergency Medicine

## 2016-03-30 DIAGNOSIS — Z79899 Other long term (current) drug therapy: Secondary | ICD-10-CM | POA: Insufficient documentation

## 2016-03-30 DIAGNOSIS — J111 Influenza due to unidentified influenza virus with other respiratory manifestations: Secondary | ICD-10-CM | POA: Insufficient documentation

## 2016-03-30 DIAGNOSIS — I10 Essential (primary) hypertension: Secondary | ICD-10-CM | POA: Insufficient documentation

## 2016-03-30 MED ORDER — OSELTAMIVIR PHOSPHATE 75 MG PO CAPS: 75.0000 mg | ORAL_CAPSULE | Freq: Two times a day (BID) | ORAL | 0 refills | Status: AC

## 2016-03-30 MED ORDER — GUAIFENESIN-CODEINE 100-10 MG/5ML PO SYRP: 5.0000 mL | ORAL_SOLUTION | Freq: Three times a day (TID) | ORAL | 0 refills | Status: AC | PRN

## 2016-03-30 NOTE — ED Triage Notes (Signed)
Pt ambulatory to triage.  Pt reports bodyaches, chills fever for 2 days.  Pt also has a cough.   Pt alert.

## 2016-03-30 NOTE — ED Provider Notes (Signed)
[facility] Emergency Department Provider Note  ____________________________________________  Time seen: Approximately 5:21 PM  I have reviewed the triage vital signs and the nursing notes.   HISTORY  Chief Complaint Influenza    HPI Anna Barker is a 36 y.o. female presents to emergency department with 2 days of fever, chills, body aches, congestion, cough, vomiting, diarrhea. Patient is coughing up white phlegm. Patient is eating and drinking well. No change in urination. Patient has taken Tylenol and ibuprofen for symptoms. Patient did not receive flu shot this year. Patient deniesshortness of breath, chest pain, abdominal pain.   Past Medical History:  Diagnosis Date  . Hypertension     There are no active problems to display for this patient.   Past Surgical History:  Procedure Laterality Date  . CHOLECYSTECTOMY      Prior to Admission medications   Medication Sig Start Date End Date Taking? Authorizing Provider  guaiFENesin-codeine (ROBITUSSIN AC) 100-10 MG/5ML syrup Take 5 mLs by mouth 3 (three) times daily as needed for cough. 03/30/16 04/01/16  Enid Derry, PA-C  meloxicam (MOBIC) 15 MG tablet Take 1 tablet (15 mg total) by mouth daily. 11/15/15   Chinita Pester, FNP  oseltamivir (TAMIFLU) 75 MG capsule Take 1 capsule (75 mg total) by mouth 2 (two) times daily. 03/30/16 04/04/16  Enid Derry, PA-C    Allergies Patient has no known allergies.  No family history on file.  Social History Social History  Substance Use Topics  . Smoking status: Never Smoker  . Smokeless tobacco: Never Used  . Alcohol use Yes     Review of Systems  Cardiovascular: No chest pain. Respiratory:No SOB. Gastrointestinal: No abdominal pain. Skin: Negative for rash, abrasions, lacerations, ecchymosis. Neurological: Negative for numbness or tingling   ____________________________________________   PHYSICAL EXAM:  VITAL SIGNS: ED Triage Vitals   Enc Vitals Group     BP 03/30/16 1605 (!) 158/120     Pulse Rate 03/30/16 1605 99     Resp 03/30/16 1605 20     Temp 03/30/16 1605 98.4 F (36.9 C)     Temp Source 03/30/16 1605 Oral     SpO2 03/30/16 1605 100 %     Weight 03/30/16 1606 240 lb (108.9 kg)     Height 03/30/16 1606 5\' 7"  (1.702 m)     Head Circumference --      Peak Flow --      Pain Score 03/30/16 1606 6     Pain Loc --      Pain Edu? --      Excl. in GC? --      Constitutional: Alert and oriented. Well appearing and in no acute distress. Eyes: Conjunctivae are normal. PERRL. EOMI. Head: Atraumatic. ENT:      Ears:      Nose: No congestion/rhinnorhea.      Mouth/Throat: Mucous membranes are moist. Tonsils nonenlarged. Oropharynx nonerythematous. Neck: No stridor.   Cardiovascular: Normal rate, regular rhythm.  Good peripheral circulation. Respiratory: Normal respiratory effort without tachypnea or retractions. Lungs CTAB. Good air entry to the bases with no decreased or absent breath sounds. Gastrointestinal: Bowel sounds 4 quadrants. Soft and nontender to palpation. No guarding or rigidity. No palpable masses. No distention.  Musculoskeletal: Full range of motion to all extremities. No gross deformities appreciated. Neurologic:  Normal speech and language. No gross focal neurologic deficits are appreciated.  Skin:  Skin is warm, dry and intact. No rash noted. Psychiatric: Mood and affect are normal.  Speech and behavior are normal. Patient exhibits appropriate insight and judgement.   ____________________________________________   LABS (all labs ordered are listed, but only abnormal results are displayed)  Labs Reviewed - No data to display ____________________________________________  EKG   ____________________________________________  RADIOLOGY  No results found.  ____________________________________________    PROCEDURES  Procedure(s) performed:    Procedures    Medications - No  data to display   ____________________________________________   INITIAL IMPRESSION / ASSESSMENT AND PLAN / ED COURSE  Pertinent labs & imaging results that were available during my care of the patient were reviewed by me and considered in my medical decision making (see chart for details).  Review of the [place] CSRS was performed in accordance of the [facility] prior to dispensing any controlled drugs.     Patient's diagnosis is consistent with influenza. Vital signs and exam are reassuring. Patient appears well and is staying well hydrated. Patient is just within the window to receive Tamiflu. Education was provided. All questions were answered. Patient will be discharged home with prescriptions for Tamiflu and Robitussin. Patient is to follow up with PCP as directed. Patient is given ED precautions to return to the ED for any worsening or new symptoms.     ____________________________________________  FINAL CLINICAL IMPRESSION(S) / ED DIAGNOSES  Final diagnoses:  Influenza      NEW MEDICATIONS STARTED DURING THIS VISIT:  New Prescriptions   GUAIFENESIN-CODEINE (ROBITUSSIN AC) 100-10 MG/5ML SYRUP    Take 5 mLs by mouth 3 (three) times daily as needed for cough.   OSELTAMIVIR (TAMIFLU) 75 MG CAPSULE    Take 1 capsule (75 mg total) by mouth 2 (two) times daily.        This chart was dictated using voice recognition software/Dragon. Despite best efforts to proofread, errors can occur which can change the meaning. Any change was purely unintentional.    Enid Derryshley Jaslene Marsteller, PA-C 03/30/16 1733    Nita Sicklearolina Veronese, MD 04/03/16 [phone]

## 2016-04-18 ENCOUNTER — Encounter: Payer: Self-pay | Admitting: Medical Oncology

## 2016-04-18 ENCOUNTER — Emergency Department: Payer: Self-pay

## 2016-04-18 ENCOUNTER — Emergency Department
Admission: EM | Admit: 2016-04-18 | Discharge: 2016-04-18 | Disposition: A | Discharge status: Home / Self Care | Payer: Self-pay | Attending: Emergency Medicine | Admitting: Emergency Medicine

## 2016-04-18 DIAGNOSIS — Z5181 Encounter for therapeutic drug level monitoring: Secondary | ICD-10-CM | POA: Insufficient documentation

## 2016-04-18 DIAGNOSIS — Z79899 Other long term (current) drug therapy: Secondary | ICD-10-CM | POA: Insufficient documentation

## 2016-04-18 DIAGNOSIS — I10 Essential (primary) hypertension: Secondary | ICD-10-CM | POA: Insufficient documentation

## 2016-04-18 DIAGNOSIS — R29810 Facial weakness: Secondary | ICD-10-CM

## 2016-04-18 DIAGNOSIS — G510 Bell's palsy: Secondary | ICD-10-CM

## 2016-04-18 LAB — DIFFERENTIAL
BASOS ABS: 0.2 10*3/uL — AB (ref 0–0.1)
BASOS PCT: 1 %
EOS PCT: 1 %
Eosinophils Absolute: 0.2 10*3/uL (ref 0–0.7)
Lymphocytes Relative: 29 %
Lymphs Abs: 3.6 10*3/uL (ref 1.0–3.6)
MONO ABS: 0.6 10*3/uL (ref 0.2–0.9)
MONOS PCT: 5 %
Neutro Abs: 7.7 10*3/uL — ABNORMAL HIGH (ref 1.4–6.5)
Neutrophils Relative %: 64 %

## 2016-04-18 LAB — COMPREHENSIVE METABOLIC PANEL
ALT: 15 U/L (ref 14–54)
AST: 21 U/L (ref 15–41)
Albumin: 4 g/dL (ref 3.5–5.0)
Alkaline Phosphatase: 54 U/L (ref 38–126)
Anion gap: 7 (ref 5–15)
BUN: 9 mg/dL (ref 6–20)
CHLORIDE: 106 mmol/L (ref 101–111)
CO2: 25 mmol/L (ref 22–32)
Calcium: 9 mg/dL (ref 8.9–10.3)
Creatinine, Ser: 0.64 mg/dL (ref 0.44–1.00)
Glucose, Bld: 93 mg/dL (ref 65–99)
POTASSIUM: 3.3 mmol/L — AB (ref 3.5–5.1)
Sodium: 138 mmol/L (ref 135–145)
TOTAL PROTEIN: 8.1 g/dL (ref 6.5–8.1)
Total Bilirubin: 0.7 mg/dL (ref 0.3–1.2)

## 2016-04-18 LAB — PROTIME-INR
INR: 1.03
Prothrombin Time: 13.5 seconds (ref 11.4–15.2)

## 2016-04-18 LAB — TROPONIN I

## 2016-04-18 LAB — CBC
HEMATOCRIT: 38.2 % (ref 35.0–47.0)
Hemoglobin: 13 g/dL (ref 12.0–16.0)
MCH: 28.6 pg (ref 26.0–34.0)
MCHC: 33.9 g/dL (ref 32.0–36.0)
MCV: 84.4 fL (ref 80.0–100.0)
Platelets: 423 10*3/uL (ref 150–440)
RBC: 4.52 MIL/uL (ref 3.80–5.20)
RDW: 14.4 % (ref 11.5–14.5)
WBC: 12.2 10*3/uL — ABNORMAL HIGH (ref 3.6–11.0)

## 2016-04-18 LAB — HCG, QUANTITATIVE, PREGNANCY: hCG, Beta Chain, Quant, S: 1 m[IU]/mL (ref <5)

## 2016-04-18 LAB — APTT: APTT: 27 s (ref 24–36)

## 2016-04-18 MED ORDER — VALACYCLOVIR HCL 1 G PO TABS: 1000.0000 mg | ORAL_TABLET | Freq: Three times a day (TID) | ORAL | 0 refills | Status: AC

## 2016-04-18 MED ORDER — PREDNISONE 20 MG PO TABS
60.0000 mg | ORAL_TABLET | Freq: Once | ORAL | Status: AC
  Administered 2016-04-18: 60 mg via ORAL
  Filled 2016-04-18: qty 3

## 2016-04-18 MED ORDER — VALACYCLOVIR HCL 500 MG PO TABS
1000.0000 mg | ORAL_TABLET | Freq: Once | ORAL | Status: AC
  Administered 2016-04-18: 1000 mg via ORAL
  Filled 2016-04-18: qty 2

## 2016-04-18 MED ORDER — PREDNISONE 10 MG PO TABS: 60.0000 mg | ORAL_TABLET | Freq: Every day | ORAL | 0 refills | Status: AC

## 2016-04-18 MED ORDER — ARTIFICIAL TEARS OP OINT: TOPICAL_OINTMENT | Freq: Every day | OPHTHALMIC | 0 refills | Status: DC

## 2016-04-18 NOTE — ED Triage Notes (Signed)
Pt c/o rt sided facial numbness and tingling with some rt sided arm weakness that began at 0600 this am, pt woke up fine. While driving her pt began having left sided chest pain that she describes as tightness.

## 2016-04-18 NOTE — Consult Note (Addendum)
Referring Physician: Rifenbark    Chief Complaint: Right facial pain and droop  HPI: Anna Barker [place] is an 36 y.o. female who reports that she has recently had the flu.  Got drunk yesterday and later in the day began to feel poorly.  Awakened at baseline this morning then later noted right facial pain.  Noted that she was unable to close her eye on the right completely and that her mouth did not feel right.  With no improvement in her symptoms patient presented for evaluation.  Initial NIHSS of 2.  Date last known well: Date: 04/18/2016 Time last known well: Time: 06:00 tPA Given: No: Minimal symptoms  Past Medical History:  Diagnosis Date  . Hypertension     Past Surgical History:  Procedure Laterality Date  . CHOLECYSTECTOMY      Family history: Noncontributory  Social History:  reports that she has never smoked. She has never used smokeless tobacco. She reports that she drinks alcohol. She reports that she uses drugs, including Marijuana.  Allergies: No Known Allergies  Medications: I have reviewed the patient's current medications. Prior to Admission:  None  ROS: History obtained from the patient  General ROS: negative for - chills, fatigue, fever, night sweats, weight gain or weight loss Psychological ROS: negative for - behavioral disorder, hallucinations, memory difficulties, mood swings or suicidal ideation Ophthalmic ROS: negative for - blurry vision, double vision, eye pain or loss of vision ENT ROS: negative for - epistaxis, nasal discharge, oral lesions, sore throat, tinnitus or vertigo Allergy and Immunology ROS: negative for - hives or itchy/watery eyes Hematological and Lymphatic ROS: negative for - bleeding problems, bruising or swollen lymph nodes Endocrine ROS: negative for - galactorrhea, hair pattern changes, polydipsia/polyuria or temperature intolerance Respiratory ROS: negative for - cough, hemoptysis, shortness of breath or wheezing Cardiovascular ROS:  chest pain Gastrointestinal ROS: negative for - abdominal pain, diarrhea, hematemesis, nausea/vomiting or stool incontinence Genito-Urinary ROS: negative for - dysuria, hematuria, incontinence or urinary frequency/urgency Musculoskeletal ROS: negative for - joint swelling or muscular weakness Neurological ROS: as noted in HPI Dermatological ROS: negative for rash and skin lesion changes  Physical Examination: Height 5\' 7"  (1.702 m), weight 113.4 kg (250 lb), last menstrual period 03/21/2016.  HEENT-  Normocephalic, no lesions, without obvious abnormality.  Normal external eye and conjunctiva.  Normal TM's bilaterally.  Normal auditory canals and external ears. Normal external nose, mucus membranes and septum.  Normal pharynx. Cardiovascular- S1, S2 normal, pulses palpable throughout   Lungs- chest clear, no wheezing, rales, normal symmetric air entry Abdomen- soft, non-tender; bowel sounds normal; no masses,  no organomegaly Extremities- no edema Lymph-no adenopathy palpable Musculoskeletal-no joint tenderness, deformity or swelling Skin-warm and dry, no hyperpigmentation, vitiligo, or suspicious lesions  Neurological Examination   Mental Status: Alert, oriented, thought content appropriate.  Speech fluent without evidence of aphasia.  Able to follow 3 step commands without difficulty. Cranial Nerves: II: Discs flat bilaterally; Visual fields grossly normal, pupils equal, round, reactive to light and accommodation III,IV, VI: ptosis not present, extra-ocular motions intact bilaterally V,VII: right facial droop, facial light touch sensation decreased on the right VIII: hearing normal bilaterally IX,X: gag reflex present XI: bilateral shoulder shrug XII: midline tongue extension Motor: Right : Upper extremity   5/5    Left:     Upper extremity   5/5  Lower extremity   5/5     Lower extremity   5/5 Tone and bulk:normal tone throughout; no atrophy noted Sensory: Pinprick and light  touch  intact throughout, bilaterally initially and later patient reported a minor descrepency in sensation in the RUE. Deep Tendon Reflexes: 2+ and symmetric throughout Plantars: Right: downgoing   Left: downgoing Cerebellar: Normal finger-to-nose and normal heel-to-shin testing bilaterally Gait: normal gait and station    Laboratory Studies:  Basic Metabolic Panel: No results for input(s): NA, K, CL, CO2, GLUCOSE, BUN, CREATININE, CALCIUM, MG, PHOS in the last 168 hours.  Liver Function Tests: No results for input(s): AST, ALT, ALKPHOS, BILITOT, PROT, ALBUMIN in the last 168 hours. No results for input(s): LIPASE, AMYLASE in the last 168 hours. No results for input(s): AMMONIA in the last 168 hours.  CBC:  Recent Labs Lab 04/18/16 0934  WBC 12.2*  NEUTROABS 7.7*  HGB 13.0  HCT 38.2  MCV 84.4  PLT 423    Cardiac Enzymes: No results for input(s): CKTOTAL, CKMB, CKMBINDEX, TROPONINI in the last 168 hours.  BNP: Invalid input(s): POCBNP  CBG: No results for input(s): GLUCAP in the last 168 hours.  Microbiology: Results for orders placed or performed in visit on 06/24/11  Urine culture     Status: None   Collection Time: 06/24/11  6:08 PM  Result Value Ref Range Status   Micro Text Report   Final       SOURCE: CC    COMMENT                   MIXED BACTERIAL ORGANISMS   COMMENT                   RESULTS SUGGESTIVE OF CONTAMINATION   ANTIBIOTIC                                                      Beta Strep Culture [facility])     Status: None   Collection Time: 06/24/11  9:24 PM  Result Value Ref Range Status   Micro Text Report   Final       SOURCE: THROAT    COMMENT                   NO BETA STREPTOCOCCUS ISOLATED IN 48 HOURS   ANTIBIOTIC                                                        Coagulation Studies: No results for input(s): LABPROT, INR in the last 72 hours.  Urinalysis: No results for input(s): COLORURINE, LABSPEC, PHURINE, GLUCOSEU, HGBUR,  BILIRUBINUR, KETONESUR, PROTEINUR, UROBILINOGEN, NITRITE, LEUKOCYTESUR in the last 168 hours.  Invalid input(s): APPERANCEUR  Lipid Panel: No results found for: CHOL, TRIG, HDL, CHOLHDL, VLDL, LDLCALC  HgbA1C: No results found for: HGBA1C  Urine Drug Screen:  No results found for: LABOPIA, COCAINSCRNUR, LABBENZ, AMPHETMU, THCU, LABBARB  Alcohol Level: No results for input(s): ETH in the last 168 hours.  Other results: EKG: normal sinus rhythm at 95 bpm.  Imaging: No results found.  Assessment: 36 y.o. female with recent viral illness presenting with right facial droop, loss of sensation and facial pain.  Exam consistent with a Bell's palsy but later in the examination patient reported some minor sensory changes in  the RUE.  Further imaging recommended.  Symptoms still minor and not warranting tPA.  Will continue to monitor.    Stroke Risk Factors - smoking  Plan: 1. MRI of the brain without contrast.  If nondiagnostic of an acute ischemic event would not proceed with a stroke work up but treat as a Bell's palsy.  Suspect other symptoms are functional due to her increasing anxiety.     2. ASA 81mg  daily 3. NPO until RN stroke swallow screen 4. Telemetry monitoring 5. Frequent neuro checks  Case discussed with Dr. Margette Fast, MD Neurology [phone] 04/18/2016, 9:48 AM  Addendum: MRI of the brain unremarkable.  Agree with above recommendations.  Thana Farr, MD Neurology [phone]

## 2016-04-18 NOTE — ED Notes (Signed)
Called code stroke to [phone]

## 2016-04-18 NOTE — ED Provider Notes (Signed)
[facility] Emergency Department Provider Note  ____________________________________________   First MD Initiated Contact with Patient 04/18/16 [phone]     (approximate)  I have reviewed the triage vital signs and the nursing notes.   HISTORY  Chief Complaint Facial Droop; Numbness; and Chest Pain   HPI Leanora CoverShonte [place] is a 36 y.o. female who comes to the emergency department with right facial droop ever since she woke up around 6:30 in the morning. She denies pain. She denies headache. She denies abdominal pain nausea or vomiting. She does report mild weakness in her right arm. She reports tingling on the right side of her face. She has not gone camping recently and has not gone hiking recently. She denies tick bites.She has never had a stroke. She denies pregnancy. She also reports some sharp intermittent chest discomfort. Nonexertional.   Past Medical History:  Diagnosis Date  . Hypertension     Patient Active Problem List   Diagnosis Date Noted  . Bell's palsy   . Facial droop     Past Surgical History:  Procedure Laterality Date  . CHOLECYSTECTOMY      Prior to Admission medications   Medication Sig Start Date End Date Taking? Authorizing Provider  acetaminophen (TYLENOL) 500 MG tablet Take 1,000 mg by mouth every 6 (six) hours as needed.   Yes Historical Provider, MD  Multiple Vitamin (MULTIVITAMIN) tablet Take 1 tablet by mouth daily.   Yes Historical Provider, MD  artificial tears (LACRILUBE) OINT ophthalmic ointment Place into the right eye at bedtime. 04/18/16   Merrily BrittleNeil Ema Hebner, MD  meloxicam (MOBIC) 15 MG tablet Take 1 tablet (15 mg total) by mouth daily. Patient not taking: Reported on 04/18/2016 11/15/15   Chinita Pesterari B Triplett, FNP  predniSONE (DELTASONE) 10 MG tablet Take 6 tablets (60 mg total) by mouth daily. 04/18/16 04/25/16  Merrily BrittleNeil Shaneece Stockburger, MD  valACYclovir (VALTREX) 1000 MG tablet Take 1 tablet (1,000 mg total) by mouth 3 (three) times  daily. 04/18/16 04/25/16  Merrily BrittleNeil Skyelyn Scruggs, MD    Allergies Patient has no known allergies.  No family history on file.  Social History Social History  Substance Use Topics  . Smoking status: Never Smoker  . Smokeless tobacco: Never Used  . Alcohol use Yes    Review of Systems Constitutional: No fever/chills Eyes: No visual changes. ENT: No sore throat. Cardiovascular: Positive chest pain. Respiratory: Denies shortness of breath. Gastrointestinal: No abdominal pain.  No nausea, no vomiting.  No diarrhea.  No constipation. Genitourinary: Negative for dysuria. Musculoskeletal: Negative for back pain. Skin: Negative for rash. Neurological: Negative for headaches, positive for focal weakness and numbness.  10-point ROS otherwise negative.  ____________________________________________   PHYSICAL EXAM:  VITAL SIGNS: ED Triage Vitals [04/18/16 0920]  Enc Vitals Group     BP      Pulse      Resp      Temp      Temp src      SpO2      Weight 250 lb (113.4 kg)     Height 5\' 7"  (1.702 m)     Head Circumference      Peak Flow      Pain Score 7     Pain Loc      Pain Edu?      Excl. in GC?     Constitutional: Alert and oriented x 4 well appearing nontoxic no diaphoresis speaks in full, clear sentences Eyes: PERRL EOMI. Head: Atraumatic. Nose: No congestion/rhinnorhea. Mouth/Throat:  No trismus Neck: No stridor.   Cardiovascular: Normal rate, regular rhythm. Grossly normal heart sounds.  Good peripheral circulation. Respiratory: Normal respiratory effort.  No retractions. 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 Unable to for eyebrow and unable to completely  close eyes. No pronator drift 5 out of 5 biceps triceps hip flexion and hip extension plantar flexion dorsiflexion sensation intact to light touch throughout 2+ DTRs no ankle clonus Skin:  Skin is warm, dry and intact. No rash noted. Psychiatric: Mood and affect are normal. Speech and behavior are normal.    ____________________________________________   DIFFERENTIAL  Stroke, TIA, vertebral artery dissection, seventh nerve palsy ____________________________________________   LABS (all labs ordered are listed, but only abnormal results are displayed)  Labs Reviewed  CBC - Abnormal; Notable for the following:       Result Value   WBC 12.2 (*)    All other components within normal limits  DIFFERENTIAL - Abnormal; Notable for the following:    Neutro Abs 7.7 (*)    Basophils Absolute 0.2 (*)    All other components within normal limits  COMPREHENSIVE METABOLIC PANEL - Abnormal; Notable for the following:    Potassium 3.3 (*)    All other components within normal limits  PROTIME-INR  APTT  TROPONIN I  HCG, QUANTITATIVE, PREGNANCY    Unremarkable __________________________________________  EKG  ED ECG REPORT I, Merrily Brittle, the attending physician, personally viewed and interpreted this ECG.  Date: 04/18/2016 Rate: 95 Rhythm: normal sinus rhythm QRS Axis: normal Intervals: normal ST/T Wave abnormalities: normal Conduction Disturbances: none Narrative Interpretation: unremarkable  ____________________________________________  RADIOLOGY  CT negative for acute pathology MRI negative for acute pathology ____________________________________________   PROCEDURES  Procedure(s) performed: no  Procedures  Critical Care performed: yes  CRITICAL CARE Performed by: Merrily Brittle   Total critical care time: 35 minutes  Critical care time was exclusive of separately billable procedures and treating other patients.  Critical care was necessary to treat or  prevent imminent or life-threatening deterioration.  Critical care was time spent personally by me on the following activities: development of treatment plan with patient and/or surrogate as well as nursing, discussions with consultants, evaluation of patient's response to treatment, examination of patient, obtaining history from patient or surrogate, ordering and performing treatments and interventions, ordering and review of laboratory studies, ordering and review of radiographic studies, pulse oximetry and re-evaluation of patient's condition.   ____________________________________________   INITIAL IMPRESSION / ASSESSMENT AND PLAN / ED COURSE  Pertinent labs & imaging results that were available during my care of the patient were reviewed by me and considered in my medical decision making (see chart for details).  By the time I had seen the patient she guarded he had a code stroke called, head CT done, and had been evaluated by neurologist Dr. Thad Ranger.      _________  ----------------------------------------- 12:05 PM on 04/18/2016 -----------------------------------------  MRI is negative for acute infarct. 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 I have given her her first dose of steroids of valacyclovir here and given her explicit instructions to make sure she wears an eye patch at night. ENT referral given. She is medically stable for outpatient management understands and  agrees with the plan. ___________________________________   FINAL CLINICAL IMPRESSION(S) / ED DIAGNOSES  Final diagnoses:  Bell's palsy      NEW MEDICATIONS STARTED DURING THIS VISIT:  New Prescriptions   ARTIFICIAL TEARS (LACRILUBE) OINT OPHTHALMIC OINTMENT    Place into the right eye at bedtime.   PREDNISONE (DELTASONE) 10 MG TABLET    Take 6 tablets (60 mg total) by mouth daily.   VALACYCLOVIR (VALTREX) 1000 MG TABLET    Take 1 tablet (1,000 mg total) by  mouth 3 (three) times daily.     Note:  This document was prepared using Dragon voice recognition software and may include unintentional dictation errors.     Merrily Brittle, MD 04/18/16 1215

## 2016-04-18 NOTE — Discharge Instructions (Addendum)
Please take your steroids and antiviral medications as prescribed for 1 week. The most dangerous thing with Bell's palsy is a your likely to scratch her eye when you're asleep so please buy an eye patch or take her eye closed at night. If her symptoms do not dramatically improve in one week please make an appointment to follow up with an otolaryngologist for recheck.  It was a pleasure to take care of you today, and thank you for coming to our emergency department.  If you have any questions or concerns before leaving please ask the nurse to grab me and I'm more than happy to go through your aftercare instructions again.  If you were prescribed any opioid pain medication today such as Norco, Vicodin, Percocet, morphine, hydrocodone, or oxycodone please make sure you do not drive when you are taking this medication as it can alter your ability to drive safely.  If you have any concerns once you are home that you are not improving or are in fact getting worse before you can make it to your follow-up appointment, please do not hesitate to call 911 and come back for further evaluation.  Merrily BrittleNeil Amoni Scallan MD  Results for orders placed or performed during the hospital encounter of 04/18/16  Protime-INR  Result Value Ref Range   Prothrombin Time 13.5 11.4 - 15.2 seconds   INR 1.03   APTT  Result Value Ref Range   aPTT 27 24 - 36 seconds  CBC  Result Value Ref Range   WBC 12.2 (H) 3.6 - 11.0 K/uL   RBC 4.52 3.80 - 5.20 MIL/uL   Hemoglobin 13.0 12.0 - 16.0 g/dL   HCT 16.138.2 09.635.0 - 04.547.0 %   MCV 84.4 80.0 - 100.0 fL   MCH 28.6 26.0 - 34.0 pg   MCHC 33.9 32.0 - 36.0 g/dL   RDW 40.914.4 81.111.5 - 91.414.5 %   Platelets 423 150 - 440 K/uL  Differential  Result Value Ref Range   Neutrophils Relative % 64 %   Neutro Abs 7.7 (H) 1.4 - 6.5 K/uL   Lymphocytes Relative 29 %   Lymphs Abs 3.6 1.0 - 3.6 K/uL   Monocytes Relative 5 %   Monocytes Absolute 0.6 0.2 - 0.9 K/uL   Eosinophils Relative 1 %   Eosinophils  Absolute 0.2 0 - 0.7 K/uL   Basophils Relative 1 %   Basophils Absolute 0.2 (H) 0 - 0.1 K/uL  Comprehensive metabolic panel  Result Value Ref Range   Sodium 138 135 - 145 mmol/L   Potassium 3.3 (L) 3.5 - 5.1 mmol/L   Chloride 106 101 - 111 mmol/L   CO2 25 22 - 32 mmol/L   Glucose, Bld 93 65 - 99 mg/dL   BUN 9 6 - 20 mg/dL   Creatinine, Ser 7.820.64 0.44 - 1.00 mg/dL   Calcium 9.0 8.9 - 95.610.3 mg/dL   Total Protein 8.1 6.5 - 8.1 g/dL   Albumin 4.0 3.5 - 5.0 g/dL   AST 21 15 - 41 U/L   ALT 15 14 - 54 U/L   Alkaline Phosphatase 54 38 - 126 U/L   Total Bilirubin 0.7 0.3 - 1.2 mg/dL   GFR calc non Af Amer >60 >60 mL/min   GFR calc Af Amer >60 >60 mL/min   Anion gap 7 5 - 15  Troponin I  Result Value Ref Range   Troponin I <0.03 <0.03 ng/mL  hCG, quantitative, pregnancy  Result Value Ref Range   hCG, Beta Chain,  Quant, S 1 <5 mIU/mL   Mr Brain Wo Contrast  Result Date: 04/18/2016 CLINICAL DATA:  New onset right-sided facial numbness and tingling. Right arm weakness beginning at 6 a.m. today. EXAM: MRI HEAD WITHOUT CONTRAST TECHNIQUE: Multiplanar, multiecho pulse sequences of the brain and surrounding structures were obtained without intravenous contrast. COMPARISON:  CT head without contrast from the same day. FINDINGS: Brain: No acute infarct, hemorrhage, or mass lesion is present. The ventricles are of normal size. No significant extraaxial fluid collection is present. Internal auditory canals are within normal limits. The brainstem and cerebellum are normal. Vascular: Flow is present in the major intracranial arteries. Skull and upper cervical spine: The skullbase is within normal limits. Midline sagittal structures are unremarkable. The upper cervical spine is within normal limits. Sinuses/Orbits: Mild mucosal thickening is present in the inferior maxillary sinuses bilaterally. The paranasal sinuses and mastoid air cells are otherwise clear. Globes and orbits are within normal limits.  IMPRESSION: 1. Normal MRI of the brain. 2. Minimal maxillary sinus disease. Electronically Signed   By: Marin Roberts M.D.   On: 04/18/2016 11:46   Ct Head Code Stroke W/o Cm  Result Date: 04/18/2016 CLINICAL DATA:  Code stroke. The patient awoke with right facial drooping at 6 a.m. today. EXAM: CT HEAD WITHOUT CONTRAST TECHNIQUE: Contiguous axial images were obtained from the base of the skull through the vertex without intravenous contrast. COMPARISON:  CTA of the neck 05/24/2007 FINDINGS: Brain: No acute infarct, hemorrhage, or mass lesion is present. No significant extraaxial fluid collection is present. The ventricles are of normal size. Vascular: No hyperdense vessel or unexpected calcification. Skull: Normal. Negative for fracture or focal lesion. Sinuses/Orbits: The paranasal sinuses and mastoid air cells are clear. The globes and orbits are within normal limits. ASPECTS [facility] Stroke Program Early CT Score) - Ganglionic level infarction (caudate, lentiform nuclei, internal capsule, insula, M1-M3 cortex): 7/7 - Supraganglionic infarction (M4-M6 cortex): 3/3 Total score (0-10 with 10 being normal): 10/10 IMPRESSION: 1. Normal CT appearance of the brain. 2. ASPECTS is 10/10 These results were called by telephone at the time of interpretation on 04/18/2016 at 9:43 am to DR. Milicent Acheampong , who verbally acknowledged these results. Electronically Signed   By: Marin Roberts M.D.   On: 04/18/2016 09:46

## 2016-04-18 NOTE — Progress Notes (Signed)
Chaplain responded to Code [place] for a Pt in ED Rm01. When Chaplain arrived, the Pt had just returned from the CT Scan. CH waiting for the Dc to complete medical evaluation and then talked to the Pt. No family member was present. Pt stated her parents are aware she is in hospital and did not want anyone to be contacted. Pt was alert and talking. Chaplain provided the ministry of care and presence. [facility] also [facility]      04/18/16 1000  Clinical Encounter Type  Visited With Patient;Health care provider  Visit Type Initial;Trauma;Other (Comment)  Referral From Nurse  Consult/Referral To Chaplain  Spiritual Encounters  Spiritual Needs Prayer;Emotional;Other (Comment)

## 2017-11-23 ENCOUNTER — Encounter: Payer: Self-pay | Admitting: Emergency Medicine

## 2017-11-23 ENCOUNTER — Other Ambulatory Visit: Payer: Self-pay

## 2017-11-23 ENCOUNTER — Emergency Department
Admission: EM | Admit: 2017-11-23 | Discharge: 2017-11-23 | Disposition: A | Discharge status: Home / Self Care | Payer: Self-pay | Attending: Emergency Medicine | Admitting: Emergency Medicine

## 2017-11-23 DIAGNOSIS — Z5321 Procedure and treatment not carried out due to patient leaving prior to being seen by health care provider: Secondary | ICD-10-CM | POA: Insufficient documentation

## 2017-11-23 DIAGNOSIS — M79645 Pain in left finger(s): Secondary | ICD-10-CM | POA: Insufficient documentation

## 2017-11-23 NOTE — ED Notes (Signed)
Pt called to be roomed with no response. 1610 Pt called to be roomed with no response. 0615 Pt called to be roomed with no response.

## 2017-11-23 NOTE — ED Triage Notes (Signed)
Pt states pain in her left thumb hurting through to her wrist and up her arm, states she uses repetitive hand motion at work and states it has been bothering her for a week now. NAD.

## 2017-11-25 NOTE — ED Provider Notes (Signed)
I signed up for the patient but she left without being seen prior to effort coming to my room.   Merrily Brittle, MD 11/25/17 0730

## 2017-12-16 ENCOUNTER — Other Ambulatory Visit: Payer: Self-pay

## 2017-12-16 ENCOUNTER — Emergency Department: Payer: Self-pay

## 2017-12-16 ENCOUNTER — Encounter: Payer: Self-pay | Admitting: Emergency Medicine

## 2017-12-16 ENCOUNTER — Emergency Department
Admission: EM | Admit: 2017-12-16 | Discharge: 2017-12-16 | Disposition: A | Discharge status: Home / Self Care | Payer: Self-pay | Attending: Emergency Medicine | Admitting: Emergency Medicine

## 2017-12-16 DIAGNOSIS — W503XXA Accidental bite by another person, initial encounter: Secondary | ICD-10-CM

## 2017-12-16 DIAGNOSIS — Y999 Unspecified external cause status: Secondary | ICD-10-CM | POA: Insufficient documentation

## 2017-12-16 DIAGNOSIS — Y9389 Activity, other specified: Secondary | ICD-10-CM | POA: Insufficient documentation

## 2017-12-16 DIAGNOSIS — S61259A Open bite of unspecified finger without damage to nail, initial encounter: Secondary | ICD-10-CM

## 2017-12-16 DIAGNOSIS — I10 Essential (primary) hypertension: Secondary | ICD-10-CM | POA: Insufficient documentation

## 2017-12-16 DIAGNOSIS — S86911A Strain of unspecified muscle(s) and tendon(s) at lower leg level, right leg, initial encounter: Secondary | ICD-10-CM | POA: Insufficient documentation

## 2017-12-16 DIAGNOSIS — Y929 Unspecified place or not applicable: Secondary | ICD-10-CM | POA: Insufficient documentation

## 2017-12-16 DIAGNOSIS — S61252A Open bite of right middle finger without damage to nail, initial encounter: Secondary | ICD-10-CM | POA: Insufficient documentation

## 2017-12-16 DIAGNOSIS — Z79899 Other long term (current) drug therapy: Secondary | ICD-10-CM | POA: Insufficient documentation

## 2017-12-16 MED ORDER — CYCLOBENZAPRINE HCL 5 MG PO TABS: 5.0000 mg | ORAL_TABLET | Freq: Three times a day (TID) | ORAL | 0 refills | Status: DC | PRN

## 2017-12-16 MED ORDER — NAPROXEN 500 MG PO TABS
500.0000 mg | ORAL_TABLET | Freq: Once | ORAL | Status: AC
  Administered 2017-12-16: 500 mg via ORAL
  Filled 2017-12-16: qty 1

## 2017-12-16 MED ORDER — AMOXICILLIN-POT CLAVULANATE 875-125 MG PO TABS: 1.0000 | ORAL_TABLET | Freq: Two times a day (BID) | ORAL | 0 refills | Status: DC

## 2017-12-16 MED ORDER — AMOXICILLIN-POT CLAVULANATE 875-125 MG PO TABS
1.0000 | ORAL_TABLET | Freq: Once | ORAL | Status: AC
  Administered 2017-12-16: 1 via ORAL
  Filled 2017-12-16: qty 1

## 2017-12-16 MED ORDER — NAPROXEN 500 MG PO TABS: 500.0000 mg | ORAL_TABLET | Freq: Two times a day (BID) | ORAL | 0 refills | Status: AC

## 2017-12-16 NOTE — ED Notes (Signed)
X-ray in progress 
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
## 2017-12-16 NOTE — Discharge Instructions (Signed)
Your exam is concerning for the bite to the finger. Take the antibiotic as directed. You should keep the wound clean and covered. Wear the knee brace as needed for walking. Rest with the leg elevated and apply ice to reduce pain. Follow-up with  Dr. Hyacinth Meeker for ongoing knee problems. Take the prescription meds as directed.
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
## 2018-03-04 ENCOUNTER — Encounter: Payer: Self-pay | Admitting: Emergency Medicine

## 2018-03-04 ENCOUNTER — Emergency Department
Admission: EM | Admit: 2018-03-04 | Discharge: 2018-03-04 | Disposition: A | Discharge status: Home / Self Care | Payer: Self-pay | Attending: Emergency Medicine | Admitting: Emergency Medicine

## 2018-03-04 ENCOUNTER — Other Ambulatory Visit: Payer: Self-pay

## 2018-03-04 DIAGNOSIS — Z79899 Other long term (current) drug therapy: Secondary | ICD-10-CM | POA: Insufficient documentation

## 2018-03-04 DIAGNOSIS — H1033 Unspecified acute conjunctivitis, bilateral: Secondary | ICD-10-CM | POA: Insufficient documentation

## 2018-03-04 DIAGNOSIS — I10 Essential (primary) hypertension: Secondary | ICD-10-CM | POA: Insufficient documentation

## 2018-03-04 MED ORDER — TOBRAMYCIN 0.3 % OP SOLN: 2.0000 [drp] | OPHTHALMIC | 0 refills | Status: DC

## 2018-03-04 NOTE — ED Provider Notes (Signed)
[facility] Emergency Department Provider Note  ____________________________________________   None    (approximate)  I have reviewed the triage vital signs and the nursing notes.   HISTORY  Chief Complaint Eye Problem    HPI Anna Barker is a 38 y.o. female presents emergency department complaining of bilateral eye redness with mass being and drainage.  States she woke up this morning with the same.  Her supervisor told her to stay home from work tonight due to the possible pinkeye.  She denies any fever, chills, congestion, chest pain, shortness of breath.  She denies any eye injury or contact use wear    Past Medical History:  Diagnosis Date  . Hypertension     Patient Active Problem List   Diagnosis Date Noted  . Bell's palsy   . Facial droop     Past Surgical History:  Procedure Laterality Date  . CHOLECYSTECTOMY      Prior to Admission medications   Medication Sig Start Date End Date Taking? Authorizing Provider  acetaminophen (TYLENOL) 500 MG tablet Take 1,000 mg by mouth every 6 (six) hours as needed.    [provider]  Multiple Vitamin (MULTIVITAMIN) tablet Take 1 tablet by mouth daily.    [provider]  tobramycin (TOBREX) 0.3 % ophthalmic solution Place 2 drops into both eyes every 4 (four) hours. 03/04/18   Faythe Ghee, PA-C    Allergies Patient has no known allergies.  No family history on file.  Social History Social History   Tobacco Use  . Smoking status: Never Smoker  . Smokeless tobacco: Never Used  Substance Use Topics  . Alcohol use: Yes  . Drug use: Yes    Types: Marijuana    Review of Systems  Constitutional: No fever/chills Eyes: No visual changes.  Stated for eye redness and matting ENT: No sore throat. Respiratory: Denies cough Genitourinary: Negative for dysuria. Musculoskeletal: Negative for back pain. Skin: Negative for  rash.    ____________________________________________   PHYSICAL EXAM:  VITAL SIGNS: ED Triage Vitals  Enc Vitals Group     BP 03/04/18 2236 (!) 144/99     Pulse Rate 03/04/18 2236 81     Resp 03/04/18 2236 20     Temp 03/04/18 2236 98.2 F (36.8 C)     Temp Source 03/04/18 2236 Oral     SpO2 03/04/18 2236 100 %     Weight 03/04/18 2235 270 lb (122.5 kg)     Height 03/04/18 2235 5\' 7"  (1.702 m)     Head Circumference --      Peak Flow --      Pain Score 03/04/18 2235 6     Pain Loc --      Pain Edu? --      Excl. in GC? --     Constitutional: Alert and oriented. Well appearing and in no acute distress. Eyes: Conjunctivae are injected bilaterally.  No drainage is noted at this time.  Some crusting is noted on the lashes Head: Atraumatic. Nose: No congestion/rhinnorhea. Mouth/Throat: Mucous membranes are moist.   Neck:  supple no lymphadenopathy noted Cardiovascular: Normal rate, regular rhythm. Heart sounds are normal Respiratory: Normal respiratory effort.  No retractions, lungs c t a  GU: deferred Musculoskeletal: FROM all extremities, warm and well perfused Neurologic:  Normal speech and language.  Skin:  Skin is warm, dry and intact. No rash noted. Psychiatric: Mood and affect are normal. Speech and behavior are normal.  ____________________________________________  LABS (all labs ordered are listed, but only abnormal results are displayed)  Labs Reviewed - No data to display ____________________________________________   ____________________________________________  RADIOLOGY    ____________________________________________   PROCEDURES  Procedure(s) performed: No  Procedures    ____________________________________________   INITIAL IMPRESSION / ASSESSMENT AND PLAN / ED COURSE  Pertinent labs & imaging results that were available during my care of the patient were reviewed by me and considered in my medical decision making (see chart for  details).   Patient is 38 year old female presents emergency department with complaints of conjunctivitis.  Physical exam shows patient has bilateral conjunctivitis.  She was given a prescription for tobramycin drops.  She was given a work note.  She is to follow-up with her regular doctor if not better in 3 days.  Return emergency department worsening.  States she understands will comply.  Is discharged stable condition.     As part of my medical decision making, I reviewed the following data within the electronic MEDICAL RECORD NUMBER Nursing notes reviewed and incorporated, Notes from prior ED visits and [place] Controlled Substance Database  ____________________________________________   FINAL CLINICAL IMPRESSION(S) / ED DIAGNOSES  Final diagnoses:  Acute bacterial conjunctivitis of both eyes      NEW MEDICATIONS STARTED DURING THIS VISIT:  New Prescriptions   TOBRAMYCIN (TOBREX) 0.3 % OPHTHALMIC SOLUTION    Place 2 drops into both eyes every 4 (four) hours.     Note:  This document was prepared using Dragon voice recognition software and may include unintentional dictation errors.    Faythe Ghee, PA-C 03/04/18 2325    Don Perking, [place], MD 03/05/18 [phone]

## 2018-03-04 NOTE — Discharge Instructions (Addendum)
Follow-up with your regular doctor if not better in 3 days.  Return emergency department worsening.  Use medication as prescribed.  Wash your hands every time you touch her eyes.

## 2018-03-04 NOTE — ED Notes (Signed)
Pt states that a day ago she noticed that her right eye was red, leaking and puffy then the next day it was in the left eye. She suspects pink eye. Family at bedside.

## 2018-03-04 NOTE — ED Triage Notes (Signed)
Patient ambulatory to triage with steady gait, without difficulty or distress noted; pt st since yesterday having eye drainage and irritation

## 2018-06-29 ENCOUNTER — Emergency Department: Admission: EM | Admit: 2018-06-29 | Discharge: 2018-06-29 | Discharge status: Against Medical Advice | Payer: Self-pay

## 2018-12-19 ENCOUNTER — Encounter: Payer: Self-pay | Admitting: Advanced Practice Midwife

## 2018-12-19 ENCOUNTER — Ambulatory Visit: Payer: Self-pay | Admitting: Advanced Practice Midwife

## 2018-12-19 ENCOUNTER — Other Ambulatory Visit: Payer: Self-pay

## 2018-12-19 DIAGNOSIS — Z91410 Personal history of adult physical and sexual abuse: Secondary | ICD-10-CM | POA: Insufficient documentation

## 2018-12-19 DIAGNOSIS — Z9851 Tubal ligation status: Secondary | ICD-10-CM

## 2018-12-19 DIAGNOSIS — F1010 Alcohol abuse, uncomplicated: Secondary | ICD-10-CM

## 2018-12-19 DIAGNOSIS — Z202 Contact with and (suspected) exposure to infections with a predominantly sexual mode of transmission: Secondary | ICD-10-CM

## 2018-12-19 DIAGNOSIS — F1210 Cannabis abuse, uncomplicated: Secondary | ICD-10-CM | POA: Insufficient documentation

## 2018-12-19 DIAGNOSIS — Z113 Encounter for screening for infections with a predominantly sexual mode of transmission: Secondary | ICD-10-CM

## 2018-12-19 LAB — WET PREP FOR TRICH, YEAST, CLUE
Trichomonas Exam: NEGATIVE
Yeast Exam: NEGATIVE

## 2018-12-19 MED ORDER — AZITHROMYCIN 500 MG PO TABS
1000.0000 mg | ORAL_TABLET | Freq: Once | ORAL | Status: AC
  Administered 2018-12-19: 1000 mg via ORAL

## 2018-12-19 MED ORDER — CEFTRIAXONE SODIUM 250 MG IJ SOLR
250.0000 mg | Freq: Once | INTRAMUSCULAR | Status: AC
  Administered 2018-12-19: 250 mg via INTRAMUSCULAR

## 2018-12-19 NOTE — Progress Notes (Signed)
Patient treated per SO as contact to Gonorrhea, given BCCCP number to call for Pap test, and given A. Marvin card. Jenetta Downer, RN
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
## 2018-12-27 ENCOUNTER — Telehealth: Payer: Self-pay

## 2018-12-27 DIAGNOSIS — A549 Gonococcal infection, unspecified: Secondary | ICD-10-CM

## 2018-12-27 NOTE — Telephone Encounter (Signed)
TC to patient. Verified ID via password/SS#. Informed of positive GC. Patient tx'd at visit and states tolerated tx well. Co to RTC in 2-3 months for [facility] Aileen Fass, RN

## 2019-01-04 ENCOUNTER — Other Ambulatory Visit: Payer: Self-pay

## 2019-01-04 ENCOUNTER — Encounter: Payer: Self-pay | Admitting: Emergency Medicine

## 2019-01-04 ENCOUNTER — Emergency Department: Payer: Self-pay

## 2019-01-04 ENCOUNTER — Emergency Department
Admission: EM | Admit: 2019-01-04 | Discharge: 2019-01-04 | Disposition: A | Discharge status: Home / Self Care | Payer: Self-pay | Attending: Student | Admitting: Student

## 2019-01-04 DIAGNOSIS — I10 Essential (primary) hypertension: Secondary | ICD-10-CM | POA: Insufficient documentation

## 2019-01-04 DIAGNOSIS — M25562 Pain in left knee: Secondary | ICD-10-CM | POA: Insufficient documentation

## 2019-01-04 DIAGNOSIS — S2231XA Fracture of one rib, right side, initial encounter for closed fracture: Secondary | ICD-10-CM | POA: Insufficient documentation

## 2019-01-04 DIAGNOSIS — Y999 Unspecified external cause status: Secondary | ICD-10-CM | POA: Insufficient documentation

## 2019-01-04 DIAGNOSIS — Y929 Unspecified place or not applicable: Secondary | ICD-10-CM | POA: Insufficient documentation

## 2019-01-04 DIAGNOSIS — G8929 Other chronic pain: Secondary | ICD-10-CM | POA: Insufficient documentation

## 2019-01-04 DIAGNOSIS — Y939 Activity, unspecified: Secondary | ICD-10-CM | POA: Insufficient documentation

## 2019-01-04 MED ORDER — TRAMADOL HCL 50 MG PO TABS: 50.0000 mg | ORAL_TABLET | Freq: Three times a day (TID) | ORAL | 0 refills | Status: AC | PRN

## 2019-01-04 MED ORDER — METHOCARBAMOL 500 MG PO TABS: 500.0000 mg | ORAL_TABLET | Freq: Three times a day (TID) | ORAL | 0 refills | Status: DC | PRN

## 2019-01-04 MED ORDER — METHOCARBAMOL 500 MG PO TABS
500.0000 mg | ORAL_TABLET | Freq: Once | ORAL | Status: AC
  Administered 2019-01-04: 17:00:00 500 mg via ORAL
  Filled 2019-01-04: qty 1

## 2019-01-04 MED ORDER — TRAMADOL HCL 50 MG PO TABS
50.0000 mg | ORAL_TABLET | Freq: Once | ORAL | Status: AC
  Administered 2019-01-04: 17:00:00 50 mg via ORAL
  Filled 2019-01-04: qty 1
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
## 2019-01-09 ENCOUNTER — Other Ambulatory Visit: Payer: Self-pay

## 2019-01-09 ENCOUNTER — Ambulatory Visit: Payer: Self-pay | Admitting: Licensed Clinical Social Worker

## 2019-01-09 ENCOUNTER — Encounter: Payer: Self-pay | Admitting: Licensed Clinical Social Worker

## 2019-01-09 DIAGNOSIS — F4323 Adjustment disorder with mixed anxiety and depressed mood: Secondary | ICD-10-CM
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
## 2019-01-20 ENCOUNTER — Ambulatory Visit: Payer: Self-pay | Admitting: Licensed Clinical Social Worker

## 2019-07-26 ENCOUNTER — Encounter: Payer: Self-pay | Admitting: Emergency Medicine

## 2019-07-26 ENCOUNTER — Other Ambulatory Visit: Payer: Self-pay

## 2019-07-26 DIAGNOSIS — M549 Dorsalgia, unspecified: Secondary | ICD-10-CM | POA: Diagnosis not present

## 2019-07-26 DIAGNOSIS — Z5321 Procedure and treatment not carried out due to patient leaving prior to being seen by health care provider: Secondary | ICD-10-CM | POA: Diagnosis not present
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
## 2019-07-27 ENCOUNTER — Emergency Department
Admission: EM | Admit: 2019-07-27 | Discharge: 2019-07-27 | Disposition: A | Discharge status: Home / Self Care | Payer: BLUE CROSS/BLUE SHIELD | Attending: Emergency Medicine | Admitting: Emergency Medicine

## 2019-09-28 ENCOUNTER — Emergency Department: Payer: BLUE CROSS/BLUE SHIELD

## 2019-09-28 ENCOUNTER — Other Ambulatory Visit: Payer: Self-pay

## 2019-09-28 ENCOUNTER — Encounter: Payer: Self-pay | Admitting: Emergency Medicine

## 2019-09-28 DIAGNOSIS — W010XXA Fall on same level from slipping, tripping and stumbling without subsequent striking against object, initial encounter: Secondary | ICD-10-CM | POA: Insufficient documentation

## 2019-09-28 DIAGNOSIS — S93402A Sprain of unspecified ligament of left ankle, initial encounter: Secondary | ICD-10-CM | POA: Insufficient documentation

## 2019-09-28 DIAGNOSIS — M25572 Pain in left ankle and joints of left foot: Secondary | ICD-10-CM | POA: Diagnosis present

## 2019-09-28 DIAGNOSIS — Y9289 Other specified places as the place of occurrence of the external cause: Secondary | ICD-10-CM | POA: Diagnosis not present

## 2019-09-28 DIAGNOSIS — Y998 Other external cause status: Secondary | ICD-10-CM | POA: Diagnosis not present

## 2019-09-28 DIAGNOSIS — Y9389 Activity, other specified: Secondary | ICD-10-CM | POA: Diagnosis not present

## 2019-09-28 DIAGNOSIS — I10 Essential (primary) hypertension: Secondary | ICD-10-CM | POA: Diagnosis not present

## 2019-09-28 DIAGNOSIS — Z79899 Other long term (current) drug therapy: Secondary | ICD-10-CM | POA: Insufficient documentation

## 2019-09-28 DIAGNOSIS — F1290 Cannabis use, unspecified, uncomplicated: Secondary | ICD-10-CM | POA: Diagnosis not present

## 2019-09-28 NOTE — ED Triage Notes (Signed)
Patient states that she tripped and fell earlier today. Patient with complaint of left ankle pain.

## 2019-09-29 ENCOUNTER — Emergency Department
Admission: EM | Admit: 2019-09-29 | Discharge: 2019-09-29 | Disposition: A | Discharge status: Home / Self Care | Payer: BLUE CROSS/BLUE SHIELD | Attending: Emergency Medicine | Admitting: Emergency Medicine

## 2019-09-29 DIAGNOSIS — S93402A Sprain of unspecified ligament of left ankle, initial encounter: Secondary | ICD-10-CM

## 2019-09-29 HISTORY — DX: Unspecified osteoarthritis, unspecified site: M19.90

## 2019-09-29 MED ORDER — TRAMADOL HCL 50 MG PO TABS: 50.0000 mg | ORAL_TABLET | Freq: Once | ORAL | Status: DC

## 2019-09-29 MED ORDER — IBUPROFEN 800 MG PO TABS: 800.0000 mg | ORAL_TABLET | Freq: Three times a day (TID) | ORAL | 0 refills | Status: DC | PRN

## 2019-09-29 MED ORDER — IBUPROFEN 800 MG PO TABS
800.0000 mg | ORAL_TABLET | Freq: Once | ORAL | Status: AC
  Administered 2019-09-29: 06:00:00 800 mg via ORAL

## 2019-09-29 NOTE — ED Notes (Signed)
Ankle stabilizer applied. Pt instructed on usage of ankle stabilizer. Pt instructed on use of crutches. Pt verbalized understanding and demonstrated use of crutches.
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
## 2019-09-29 NOTE — ED Notes (Signed)
MD at bedside. 

## 2019-12-26 ENCOUNTER — Ambulatory Visit: Payer: Self-pay | Admitting: Advanced Practice Midwife

## 2019-12-26 ENCOUNTER — Encounter: Payer: Self-pay | Admitting: Advanced Practice Midwife

## 2019-12-26 ENCOUNTER — Other Ambulatory Visit: Payer: Self-pay

## 2019-12-26 DIAGNOSIS — T7421XA Adult sexual abuse, confirmed, initial encounter: Secondary | ICD-10-CM | POA: Insufficient documentation

## 2019-12-26 DIAGNOSIS — Z113 Encounter for screening for infections with a predominantly sexual mode of transmission: Secondary | ICD-10-CM

## 2019-12-26 DIAGNOSIS — T7421XS Adult sexual abuse, confirmed, sequela: Secondary | ICD-10-CM
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
## 2019-12-26 NOTE — Progress Notes (Signed)
Wet mount reviewed and is negative today so no treatment needed for wet mount per standing order. Provider orders completed.

## 2019-12-27 LAB — WET PREP FOR TRICH, YEAST, CLUE
Trichomonas Exam: NEGATIVE
Yeast Exam: NEGATIVE

## 2019-12-31 LAB — GONOCOCCUS CULTURE

## 2020-06-30 ENCOUNTER — Ambulatory Visit
Admission: RE | Admit: 2020-06-30 | Discharge: 2020-06-30 | Disposition: A | Discharge status: Home / Self Care | Payer: Medicaid Other | Source: Ambulatory Visit | Attending: Sports Medicine | Admitting: Sports Medicine

## 2020-06-30 ENCOUNTER — Other Ambulatory Visit: Payer: Self-pay | Admitting: Sports Medicine

## 2020-06-30 DIAGNOSIS — G8929 Other chronic pain: Secondary | ICD-10-CM

## 2020-06-30 DIAGNOSIS — M25561 Pain in right knee: Secondary | ICD-10-CM | POA: Diagnosis present

## 2020-06-30 DIAGNOSIS — M25562 Pain in left knee: Secondary | ICD-10-CM | POA: Insufficient documentation

## 2020-09-29 ENCOUNTER — Other Ambulatory Visit: Payer: Self-pay

## 2020-09-29 ENCOUNTER — Ambulatory Visit
Payer: Medicaid Other | Attending: Student in an Organized Health Care Education/Training Program | Admitting: Student in an Organized Health Care Education/Training Program

## 2020-09-29 ENCOUNTER — Encounter: Payer: Self-pay | Admitting: Student in an Organized Health Care Education/Training Program

## 2020-09-29 VITALS — BP 140/90 | HR 95 | Temp 97.5°F | Resp 16 | Ht 67.0 in | Wt 271.0 lb

## 2020-09-29 DIAGNOSIS — M1712 Unilateral primary osteoarthritis, left knee: Secondary | ICD-10-CM | POA: Insufficient documentation

## 2020-09-29 DIAGNOSIS — G8929 Other chronic pain: Secondary | ICD-10-CM | POA: Insufficient documentation

## 2020-09-29 DIAGNOSIS — M25562 Pain in left knee: Secondary | ICD-10-CM | POA: Insufficient documentation

## 2020-09-29 NOTE — Patient Instructions (Signed)
____________________________________________________________________________________________  Genicular Nerve Block  What is a genicular nerve block? A genicular nerve block is the injection of a local anesthetic to block the nerves that transmits pain from the knee.  What is the purpose of a facet nerve block? A genicular nerve block is a diagnostic procedure to determine if the pathologic changes (i.e. arthritis, meniscal tears, etc) and inflammation within the knee joint is the source of your knee pain. It also confirms that the knee pain will respond well to the actual treatment procedure. If a genicular nerve block works, it will give you relief for several hours. After that, the pain is expected to return to normal. This test is always performed twice (usually a week or two apart) because two successful tests are required to move onto treatment. If both diagnostic tests are positive, then we schedule a treatment called radiofrequency (RF) ablation. In this procedure, the same nerves are cauterized, which typically leads to pain relief for 4 -18 months. If this process works well for one knee, it can be performed on the other knee if needed.  How is the procedure performed? You will be placed on the procedure table. The injection site is sterilized with either iodine or chlorhexadine. The site to be injected is numbed with a local anesthetic, and a needle is directed to the target area. X-ray guidance is used to ensure proper placement and positioning of the needle. When the needle is properly positioned near the genicular nerve, local anesthetic is injected to numb that nerve. This will be repeated at multiple sites around the knee to block all genicular nerves.  Will the procedure be painful? The injection can be painful and we therefore provide the option of receiving IV sedation. IV sedation, combined with local anesthetic, can make the injection nearly pain free. It allows you to remain very  still during the procedure, which can also make the injection easier, faster, and more successful. If you decide to have IV sedation, you must have a driver to get you home safely afterwards. In addition, you cannot have anything to eat or drink within 8 hours of your appointment (clear liquids are allowed until 3 hours before the procedure). If you take medications for diabetes, these medications may need to be adjusted the morning of the procedure. Your primary care physician can help you with this adjustment.  What are the discharge instructions? If you received IV sedation do not drive or operate machinery for at least 24 hours after the procedure. You may return to work the next day following your procedure. You may resume your normal diet immediately. Do not engage in any strenuous activity for 24 hours. You should, however, engage in moderate activity that typically causes your ususal pain. If the block works, those activities should not be painful for several hours after the injection. Do not take a bath, swim, or use a hot tub for 24 hours (you may take a shower). Call the office if you have any of the following: severe pain afterwards (different than your usual symptoms), redness/swelling/discharge at the injection site(s), fevers/chills, difficulty with bowel or bladder functions.  What are the risks and side effects? The complication rate for this procedure is very low. Whenever a needle enters the skin, bleeding or infection can occur. Some other serious but extremely rare risks include paralysis and death. You may have an allergic reaction to any of the medications used. If you have a known allergy to any medications, especially local anesthetics, notify   our staff before the procedure takes place. You may experience any of the following side effects up to 4 - 6 hours after the procedure: Leg muscle weakness or numbness may occur due to the local anesthetic affecting the nerves that control  your legs (this is a temporary affect and it is not paralysis). If you have any leg weakness or numbness, walk only with assistance in order to prevent falls and injury. Your leg strength will return slowly and completely. Dizziness may occur due to a decrease in your blood pressure. If this occurs, remain in a seated or lying position. Gradually sit up, and then stand after at least 10 minutes of sitting. Mild headaches may occur. Drink fluids and take pain medications if needed. If the headaches persist or become severe, call the office. Mild discomfort at the injection site can occur. This typically lasts for a few hours but can persist for a couple days. If this occurs, take anti-inflammatories or pain medications, apply ice to the area the day of the procedure. If it persists, apply moist heat in the day(s) following.  The side effects listed above can be normal. They are not dangerous and will resolve on their own. If, however, you experience any of the following, a complication may have occurred and you should either contact your doctor. If he is not readily available, then you should proceed to the closest urgent care center for evaluation: Severe or progressive pain at the injection site(s) Arm or leg weakness that progressively worsens or persists for longer than 8 hours Severe or progressive redness, swelling, or discharge from the injections site(s) Fevers, chills, nausea, or vomiting Bowel or bladder dysfunction (i.e. inability to urinate or pass stool or difficulty controlling either)  How long does it take for the procedure to work? You should feel relief from your usual pain within the first hour. Again, this is only expected to last for several hours, at the most. Remember, you may be sore in the middle part of your back from the needles, and you must distinguish this from your usual pain. ____________________________________________________________________________________________   Pain Management Discharge Instructions  General Discharge Instructions :  If you need to reach your doctor call: Monday-Friday 8:00 am - 4:00 pm at [phone] or toll free [phone].  After clinic hours [phone] to have operator reach doctor.  Bring all of your medication bottles to all your appointments in the pain clinic.  To cancel or reschedule your appointment with Pain Management please remember to call 24 hours in advance to avoid a fee.  Refer to the educational materials which you have been given on: General Risks, I had my Procedure. Discharge Instructions, Post Sedation.  Post Procedure Instructions:  The drugs you were given will stay in your system until tomorrow, so for the next 24 hours you should not drive, make any legal decisions or drink any alcoholic beverages.  You may eat anything you prefer, but it is better to start with liquids then soups and crackers, and gradually work up to solid foods.  Please notify your doctor immediately if you have any unusual bleeding, trouble breathing or pain that is not related to your normal pain.  Depending on the type of procedure that was done, some parts of your body may feel week and/or numb.  This usually clears up by tonight or the next day.  Walk with the use of an assistive device or accompanied by an adult for the 24 hours.  You may use ice   on the affected area for the first 24 hours.  Put ice in a Ziploc bag and cover with a towel and place against area 15 minutes on 15 minutes off.  You may switch to heat after 24 hours. 
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
## 2020-09-29 NOTE — Progress Notes (Signed)
Safety precautions to be maintained throughout the outpatient stay will include: orient to surroundings, keep bed in low position, maintain call bell within reach at all times, provide assistance with transfer out of bed and ambulation.  

## 2020-10-07 ENCOUNTER — Ambulatory Visit: Payer: Medicaid Other | Admitting: Student in an Organized Health Care Education/Training Program

## 2020-10-13 ENCOUNTER — Ambulatory Visit
Admission: RE | Admit: 2020-10-13 | Discharge: 2020-10-13 | Disposition: A | Discharge status: Home / Self Care | Payer: Medicaid Other | Source: Ambulatory Visit | Attending: Student in an Organized Health Care Education/Training Program | Admitting: Student in an Organized Health Care Education/Training Program

## 2020-10-13 ENCOUNTER — Encounter: Payer: Self-pay | Admitting: Student in an Organized Health Care Education/Training Program

## 2020-10-13 ENCOUNTER — Other Ambulatory Visit: Payer: Self-pay

## 2020-10-13 ENCOUNTER — Ambulatory Visit (HOSPITAL_BASED_OUTPATIENT_CLINIC_OR_DEPARTMENT_OTHER): Payer: Medicaid Other | Admitting: Student in an Organized Health Care Education/Training Program

## 2020-10-13 VITALS — BP 161/115 | HR 70 | Temp 97.2°F | Resp 16 | Ht 67.0 in | Wt 271.0 lb

## 2020-10-13 DIAGNOSIS — G8929 Other chronic pain: Secondary | ICD-10-CM | POA: Diagnosis not present

## 2020-10-13 DIAGNOSIS — M1712 Unilateral primary osteoarthritis, left knee: Secondary | ICD-10-CM | POA: Insufficient documentation

## 2020-10-13 DIAGNOSIS — M25562 Pain in left knee: Secondary | ICD-10-CM

## 2020-10-13 MED ORDER — DEXAMETHASONE SODIUM PHOSPHATE 10 MG/ML IJ SOLN
10.0000 mg | Freq: Once | INTRAMUSCULAR | Status: AC
  Administered 2020-10-13: 10 mg
  Filled 2020-10-13: qty 1

## 2020-10-13 MED ORDER — LIDOCAINE HCL (PF) 2 % IJ SOLN
INTRAMUSCULAR | Status: AC
  Filled 2020-10-13: qty 5

## 2020-10-13 MED ORDER — ROPIVACAINE HCL 2 MG/ML IJ SOLN
INTRAMUSCULAR | Status: AC
  Filled 2020-10-13: qty 20

## 2020-10-13 MED ORDER — DIAZEPAM 5 MG PO TABS
ORAL_TABLET | ORAL | Status: AC
  Filled 2020-10-13: qty 1

## 2020-10-13 MED ORDER — LIDOCAINE HCL 2 % IJ SOLN
20.0000 mL | Freq: Once | INTRAMUSCULAR | Status: AC
  Administered 2020-10-13: 200 mg

## 2020-10-13 MED ORDER — ROPIVACAINE HCL 2 MG/ML IJ SOLN
9.0000 mL | Freq: Once | INTRAMUSCULAR | Status: AC
  Administered 2020-10-13: 9 mL via PERINEURAL

## 2020-10-13 NOTE — Progress Notes (Signed)
PROVIDER NOTE: Information contained herein reflects review and annotations entered in association with encounter. Interpretation of such information and data should be left to medically-trained personnel. Information provided to patient can be located elsewhere in the medical record under "Patient Instructions". Document created using STT-dictation technology, any transcriptional errors that may result from process are unintentional.    Patient: Anna Barker  Service Category: Procedure  Provider: Edward Jolly, MD  DOB: [birth year]  DOS: 10/13/2020  Location: [facility]  MRN: [id number]  Setting: Ambulatory - outpatient  Referring Provider: No ref. provider found  Type: Established Patient  Specialty: Interventional Pain Management  PCP: Patient, No Pcp Per (Inactive)   Primary Reason for Visit: Interventional Pain Management Treatment. CC: Knee Pain (left) and Back Pain (Low and mid)    Procedure:          Anesthesia, Analgesia, Anxiolysis:  Type: Genicular Nerves Block (Superolateral, Superomedial, and Inferomedial Genicular Nerves)          CPT: 64450      Primary Purpose: Diagnostic Region: Lateral, Anterior, and Medial aspects of the knee joint, above and below the knee joint proper. Level: Superior and inferior to the knee joint. Target Area: For Genicular Nerve block(s), the targets are: the superolateral genicular nerve, located in the lateral distal portion of the femoral shaft as it curves to form the lateral epicondyle, in the region of the distal femoral metaphysis; the superomedial genicular nerve, located in the medial distal portion of the femoral shaft as it curves to form the medial epicondyle; and the inferomedial genicular nerve, located in the medial, proximal portion of the tibial shaft, as it curves to form the medial epicondyle, in the region of the proximal tibial metaphysis. Approach: Anterior, percutaneous, ipsilateral approach. Laterality: Left knee   Type: Local Anesthesia with 5 mg PO Valium Local Anesthetic: Lidocaine 1-2%   Position: Modified Fowler's position with pillows under the targeted knee(s).   Indications: 1. Chronic pain of left knee   2. Primary osteoarthritis of left knee    Pain Score: Pre-procedure: 8 /10 Post-procedure: 5 /10     Pre-op H&P Assessment:  Anna Barker is a 40 y.o. y.o. (year old), female patient, seen today for interventional treatment. She  has a past surgical history that includes Cholecystectomy. Anna Barker has a current medication list which includes the following prescription(s): diclofenac sodium, duloxetine, hydrochlorothiazide, multivitamin, tizanidine, tobramycin, acetaminophen, ibuprofen, and methocarbamol. Her primarily concern today is the Knee Pain (left) and Back Pain (Low and mid)  Initial Vital Signs:  Pulse/HCG Rate: 82  Temp: (!) 97.2 F (36.2 C) Resp: 18 BP: (!) 160/113 (did not take blood pressure medication.) SpO2: 99 %  BMI: Estimated body mass index is 42.44 kg/m as calculated from the following:   Height as of this encounter: 5\' 7"  (1.702 m).   Weight as of this encounter: 271 lb (122.9 kg).  Risk Assessment: Allergies: Reviewed. She has No Known Allergies.  Allergy Precautions: None required Coagulopathies: Reviewed. None identified.  Blood-thinner therapy: None at this time Active Infection(s): Reviewed. None identified. Ms. is afebrile  Site Confirmation: Anna Barker was asked to confirm the procedure and laterality before marking the site Procedure checklist: Completed Consent: Before the procedure and under the influence of no sedative(s), amnesic(s), or anxiolytics, the patient was informed of the treatment options, risks and possible complications. To fulfill our ethical and legal obligations, as recommended by the American Medical Association's Code of Ethics, I have informed the patient of my  clinical impression; the nature and purpose of the treatment or  procedure; the risks, benefits, and possible complications of the intervention; the alternatives, including doing nothing; the risk(s) and benefit(s) of the alternative treatment(s) or procedure(s); and the risk(s) and benefit(s) of doing nothing. The patient was provided information about the general risks and possible complications associated with the procedure. These may include, but are not limited to: failure to achieve desired goals, infection, bleeding, organ or nerve damage, allergic reactions, paralysis, and death. In addition, the patient was informed of those risks and complications associated to the procedure, such as failure to decrease pain; infection; bleeding; organ or nerve damage with subsequent damage to sensory, motor, and/or autonomic systems, resulting in permanent pain, numbness, and/or weakness of one or several areas of the body; allergic reactions; (i.e.: anaphylactic reaction); and/or death. Furthermore, the patient was informed of those risks and complications associated with the medications. These include, but are not limited to: allergic reactions (i.e.: anaphylactic or anaphylactoid reaction(s)); adrenal axis suppression; blood sugar elevation that in diabetics may result in ketoacidosis or comma; water retention that in patients with history of congestive heart failure may result in shortness of breath, pulmonary edema, and decompensation with resultant heart failure; weight gain; swelling or edema; medication-induced neural toxicity; particulate matter embolism and blood vessel occlusion with resultant organ, and/or nervous system infarction; and/or aseptic necrosis of one or more joints. Finally, the patient was informed that Medicine is not an exact science; therefore, there is also the possibility of unforeseen or unpredictable risks and/or possible complications that may result in a catastrophic outcome. The patient indicated having understood very clearly. We have given the  patient no guarantees and we have made no promises. Enough time was given to the patient to ask questions, all of which were answered to the patient's satisfaction. Anna Barker has indicated that she wanted to continue with the procedure. Attestation: I, the ordering provider, attest that I have discussed with the patient the benefits, risks, side-effects, alternatives, likelihood of achieving goals, and potential problems during recovery for the procedure that I have provided informed consent. Date  Time: 10/13/2020 11:12 AM  Pre-Procedure Preparation:  Monitoring: As per clinic protocol. Respiration, ETCO2, SpO2, BP, heart rate and rhythm monitor placed and checked for adequate function Safety Precautions: Patient was assessed for positional comfort and pressure points before starting the procedure. Time-out: I initiated and conducted the "Time-out" before starting the procedure, as per protocol. The patient was asked to participate by confirming the accuracy of the "Time Out" information. Verification of the correct person, site, and procedure were performed and confirmed by me, the nursing staff, and the patient. "Time-out" conducted as per Joint Commission's Universal Protocol (UP.01.01.01). Time: 1202  Description of Procedure:          Area Prepped: Entire knee area, from mid-thigh to mid-shin, lateral, anterior, and medial aspects. DuraPrep (Iodine Povacrylex [0.7% available iodine] and Isopropyl Alcohol, 74% w/w) Safety Precautions: Aspiration looking for blood return was conducted prior to all injections. At no point did we inject any substances, as a needle was being advanced. No attempts were made at seeking any paresthesias. Safe injection practices and needle disposal techniques used. Medications properly checked for expiration dates. SDV (single dose vial) medications used. Description of the Procedure: Protocol guidelines were followed. The patient was placed in position over the  procedure table. The target area was identified and the area prepped in the usual manner. Skin & deeper tissues infiltrated with local anesthetic. Appropriate  amount of time allowed to pass for local anesthetics to take effect. The procedure needles were then advanced to the target area. Proper needle placement secured. Negative aspiration confirmed. Solution injected in intermittent fashion, asking for systemic symptoms every 0.5cc of injectate. The needles were then removed and the area cleansed, making sure to leave some of the prepping solution back to take advantage of its long term bactericidal properties.  Vitals:   10/13/20 1115 10/13/20 1200 10/13/20 1205 10/13/20 1213  BP: (!) 160/113 (!) 167/118 (!) 160/121 (!) 161/115  Pulse: 82 69 71 70  Resp: 18 13 20 16   Temp: (!) 97.2 F (36.2 C)     SpO2: 99% 99% 98% 99%  Weight: 271 lb (122.9 kg)     Height: 5\' 7"  (1.702 m)       Start Time: 1202 hrs. End Time: 1209 hrs. Materials:  Needle(s) Type: Spinal Needle Gauge: 25G Length: 3.5-in Medication(s): Please see orders for medications and dosing details. 6 cc solution made of 5 cc of 0.2% ropivacaine, 1 cc of Decadron 10 mg/cc. 2 cc injected at each level above for the left genicular nerves  Imaging Guidance (Non-Spinal):          Type of Imaging Technique: Fluoroscopy Guidance (Non-Spinal) Indication(s): Assistance in needle guidance and placement for procedures requiring needle placement in or near specific anatomical locations not easily accessible without such assistance. Exposure Time: Please see nurses notes. Contrast: None used. Fluoroscopic Guidance: I was personally present during the use of fluoroscopy. "Tunnel Vision Technique" used to obtain the best possible view of the target area. Parallax error corrected before commencing the procedure. "Direction-depth-direction" technique used to introduce the needle under continuous pulsed fluoroscopy. Once target was reached,  antero-posterior, oblique, and lateral fluoroscopic projection used confirm needle placement in all planes. Images permanently stored in EMR. Interpretation: No contrast injected. I personally interpreted the imaging intraoperatively. Adequate needle placement confirmed in multiple planes. Permanent images saved into the patient's record.  Post-operative Assessment:  Post-procedure Vital Signs:  Pulse/HCG Rate: 70 (nsr)  Temp: (!) 97.2 F (36.2 C) Resp: 16 BP: (!) 161/115 SpO2: 99 %  EBL: None  Complications: No immediate post-treatment complications observed by team, or reported by patient.  Note: The patient tolerated the entire procedure well. A repeat set of vitals were taken after the procedure and the patient was kept under observation following institutional policy, for this type of procedure. Post-procedural neurological assessment was performed, showing return to baseline, prior to discharge. The patient was provided with post-procedure discharge instructions, including a section on how to identify potential problems. Should any problems arise concerning this procedure, the patient was given instructions to immediately contact us, at any time, without hesitation. In any case, we plan to contact the patient by telephone for a follow-up status report regarding this interventional procedure.  Comments:  No additional relevant information.  Plan of Care  Orders:  Orders Placed This Encounter  Procedures   DG PAIN CLINIC C-ARM 1-60 MIN NO REPORT    Intraoperative interpretation by procedural physician at [facility].    Standing Status:   Standing    Number of Occurrences:   1    Order Specific Question:   Reason for exam:    Answer:   Assistance in needle guidance and placement for procedures requiring needle placement in or near specific anatomical locations not easily accessible without such assistance.    Medications ordered for procedure: Meds ordered this  encounter  Medications  lidocaine (XYLOCAINE) 2 % (with pres) injection 400 mg   dexamethasone (DECADRON) injection 10 mg   dexamethasone (DECADRON) injection 10 mg   ropivacaine (PF) 2 mg/mL (0.2%) (NAROPIN) injection 9 mL   Medications administered: We administered lidocaine, dexamethasone, dexamethasone, and ropivacaine (PF) 2 mg/mL (0.2%).  See the medical record for exact dosing, route, and time of administration.  Follow-up plan:   Return in about 5 weeks (around 11/17/2020) for Post Procedure Evaluation, virtual.       Left knee genicular nerve block 10/13/20   Recent Visits Date Type Provider Dept  09/29/20 Office Visit Edward Jolly, MD [facility]  Showing recent visits within past 90 days and meeting all other requirements Today's Visits Date Type Provider Dept  10/13/20 Procedure visit Edward Jolly, MD [facility]  Showing today's visits and meeting all other requirements Future Appointments Date Type Provider Dept  11/17/20 Appointment Edward Jolly, MD [facility]  Showing future appointments within next 90 days and meeting all other requirements Disposition: Discharge home  Discharge (Date  Time): 10/13/2020; 1216 hrs.   Primary Care Physician: Patient, No Pcp Per (Inactive) Location: [facility] Note by: Edward Jolly, MD Date: 10/13/2020; Time: 12:55 PM  Disclaimer:  Medicine is not an exact science. The only guarantee in medicine is that nothing is guaranteed. It is important to note that the decision to proceed with this intervention was based on the information collected from the patient. The Data and conclusions were drawn from the patient's questionnaire, the interview, and the physical examination. Because the information was provided in large part by the patient, it cannot be guaranteed that it has not been purposely or unconsciously manipulated. Every effort has been made to obtain as much relevant  data as possible for this evaluation. It is important to note that the conclusions that lead to this procedure are derived in large part from the available data. Always take into account that the treatment will also be dependent on availability of resources and existing treatment guidelines, considered by other Pain Management Practitioners as being common knowledge and practice, at the time of the intervention. For Medico-Legal purposes, it is also important to point out that variation in procedural techniques and pharmacological choices are the acceptable norm. The indications, contraindications, technique, and results of the above procedure should only be interpreted and judged by a Board-Certified Interventional Pain Specialist with extensive familiarity and expertise in the same exact procedure and technique.

## 2020-10-13 NOTE — Progress Notes (Signed)
Safety precautions to be maintained throughout the outpatient stay will include: orient to surroundings, keep bed in low position, maintain call bell within reach at all times, provide assistance with transfer out of bed and ambulation.  

## 2020-10-13 NOTE — Patient Instructions (Signed)

## 2020-10-14 ENCOUNTER — Telehealth: Payer: Self-pay

## 2020-10-14 NOTE — Telephone Encounter (Signed)
Post procedure phone call.  Patient states she is doing well.  

## 2020-11-16 ENCOUNTER — Encounter: Payer: Self-pay | Admitting: Student in an Organized Health Care Education/Training Program

## 2020-11-17 ENCOUNTER — Other Ambulatory Visit: Payer: Self-pay

## 2020-11-17 ENCOUNTER — Ambulatory Visit
Payer: Medicaid Other | Attending: Student in an Organized Health Care Education/Training Program | Admitting: Student in an Organized Health Care Education/Training Program

## 2020-11-17 DIAGNOSIS — G8929 Other chronic pain: Secondary | ICD-10-CM

## 2020-11-17 DIAGNOSIS — M25562 Pain in left knee: Secondary | ICD-10-CM | POA: Diagnosis not present

## 2020-11-17 DIAGNOSIS — M1712 Unilateral primary osteoarthritis, left knee: Secondary | ICD-10-CM

## 2020-11-17 NOTE — Progress Notes (Signed)
Patient: Anna Barker  Service Category: E/M  Provider: [name], MD  DOB: 06/01/1980  DOS: 11/17/2020  Location: Office  MRN: [id number]  Setting: Ambulatory outpatient  Referring Provider: No ref. provider found  Type: Established Patient  Specialty: Interventional Pain Management  PCP: Patient, No Pcp Per (Inactive)  Location: Remote location  Delivery: TeleHealth     Virtual Encounter - Pain Management PROVIDER NOTE: Information contained herein reflects review and annotations entered in association with encounter. Interpretation of such information and data should be left to medically-trained personnel. Information provided to patient can be located elsewhere in the medical record under "Patient Instructions". Document created using STT-dictation technology, any transcriptional errors that may result from process are unintentional.    Contact & Pharmacy Preferred: [phone] Home: [phone] (home) Mobile: [phone] (mobile) E-mail: [email or web address]  [place] ([address]), [place]) [place] [address] Phone: [phone] Fax: [phone]   Pre-screening  [name] offered "in-person" vs "virtual" encounter. She indicated preferring virtual for this encounter.   Reason COVID-19*  Social distancing based on CDC and AMA recommendations.   I contacted Analysia [name] on 11/17/2020 via telephone.      I clearly identified myself as [name], MD. I verified that I was speaking with the correct person using two identifiers (Name: Anna Barker, and date of birth: 10/12/1980).  Consent I sought verbal advanced consent from Anna Barker for virtual visit interactions. I informed [name] of possible security and privacy concerns, risks, and limitations associated with providing "not-in-person" medical evaluation and management services. I also informed [name] of the availability of  "in-person" appointments. Finally, I informed her that there would be a charge for the virtual visit and that she could be  personally, fully or partially, financially responsible for it. [name] expressed understanding and agreed to proceed.   Historic Elements   Anna Barker is a 40 y.o. year old, female patient evaluated today after our last contact on 10/13/2020. [name]  has a past medical history of Arthritis and Hypertension. She also  has a past surgical history that includes Cholecystectomy. [name] has a current medication list which includes the following prescription(s): diclofenac sodium, duloxetine, hydrochlorothiazide, multivitamin, tizanidine, acetaminophen, ibuprofen, methocarbamol, and tobramycin. She  reports that she has never smoked. She has never used smokeless tobacco. She reports current alcohol use of about 3.0 standard drinks per week. She reports current drug use. Frequency: 7.00 times per week. Drug: Marijuana. [name] has No Known Allergies.   HPI  Today, she is being contacted for a post-procedure assessment.   Post-Procedure Evaluation  Procedure (10/13/2020):   Left knee GNB  Anxiolysis: Please see nurses note.  Effectiveness during initial hour after procedure (Ultra-Short Term Relief): 100 %   Local anesthetic used: Long-acting (4-6 hours) Effectiveness: Defined as any analgesic benefit obtained secondary to the administration of local anesthetics. This carries significant diagnostic value as to the etiological location, or anatomical origin, of the pain. Duration of benefit is expected to coincide with the duration of the local anesthetic used.  Effectiveness during initial 4-6 hours after procedure (Short-Term Relief): 90 %   Long-term benefit: Defined as any relief past the pharmacologic duration of the local anesthetics.  Effectiveness past the initial 6 hours after procedure (Long-Term Relief): 50 %   Benefits, current: Defined as  benefit present at the time of this evaluation.   Analgesia:  50% Function: [name] reports improvement  in function ROM: [name] reports improvement in ROM    Laboratory Chemistry Profile   Renal Lab Results  Component Value Date   BUN 9 04/18/2016   CREATININE 0.64 04/18/2016   GFRAA >60 04/18/2016   GFRNONAA >60 04/18/2016    Hepatic Lab Results  Component Value Date   AST 21 04/18/2016   ALT 15 04/18/2016   ALBUMIN 4.0 04/18/2016   ALKPHOS 54 04/18/2016    Electrolytes Lab Results  Component Value Date   NA 138 04/18/2016   K 3.3 (L) 04/18/2016   CL 106 04/18/2016   CALCIUM 9.0 04/18/2016    Bone No results found for: VD25OH, VD125OH2TOT, TD9741UL8, GT3646OE3, 25OHVITD1, 25OHVITD2, 25OHVITD3, TESTOFREE, TESTOSTERONE  Inflammation (CRP: Acute Phase) (ESR: Chronic Phase) No results found for: CRP, ESRSEDRATE, LATICACIDVEN       Note: Above Lab results reviewed.    Assessment  The primary encounter diagnosis was Chronic pain of left knee. A diagnosis of Primary osteoarthritis of left knee was also pertinent to this visit.  Plan of Care   Satisfactory analgesic benefit and functional improvement after left knee genicular nerve block she notes approximately 50% analgesic response of her left knee and states that she has a easier time of bearing weight on it.  She is able to work and perform ADLs more comfortably.  We will continue to monitor her symptoms.  She was instructed to contact us should she have increased left knee pain to the point that she would like to repeat genicular nerve block or proceed with genicular nerve RFA .  Patient endorsed understanding.  Follow-up plan:   Return for PRN: Genicular nerve block versus genicular RFA.     Left knee genicular nerve block 10/13/20    Recent Visits Date Type Provider Dept  10/13/20 Procedure visit [name], MD [facility]  09/29/20 Office Visit [name], MD [facility]   Showing recent visits within past 90 days and meeting all other requirements Today's Visits Date Type Provider Dept  11/17/20 Office Visit [name], MD [facility]  Showing today's visits and meeting all other requirements Future Appointments No visits were found meeting these conditions. Showing future appointments within next 90 days and meeting all other requirements I discussed the assessment and treatment plan with the patient. The patient was provided an opportunity to ask questions and all were answered. The patient agreed with the plan and demonstrated an understanding of the instructions.  Patient advised to call back or seek an in-person evaluation if the symptoms or condition worsens.  Duration of encounter: 39mnutes.  Note by: BGillis Santa MD Date: 11/17/2020; Time: 2:41 PM

## 2021-01-10 ENCOUNTER — Ambulatory Visit
Payer: Medicaid Other | Attending: Student in an Organized Health Care Education/Training Program | Admitting: Student in an Organized Health Care Education/Training Program

## 2021-01-10 ENCOUNTER — Other Ambulatory Visit: Payer: Self-pay

## 2021-01-10 ENCOUNTER — Encounter: Payer: Self-pay | Admitting: Student in an Organized Health Care Education/Training Program

## 2021-01-10 DIAGNOSIS — M1712 Unilateral primary osteoarthritis, left knee: Secondary | ICD-10-CM | POA: Diagnosis not present

## 2021-01-10 DIAGNOSIS — M25562 Pain in left knee: Secondary | ICD-10-CM | POA: Diagnosis not present

## 2021-01-10 DIAGNOSIS — M25561 Pain in right knee: Secondary | ICD-10-CM | POA: Diagnosis not present

## 2021-01-10 DIAGNOSIS — M1711 Unilateral primary osteoarthritis, right knee: Secondary | ICD-10-CM

## 2021-01-10 DIAGNOSIS — G8929 Other chronic pain: Secondary | ICD-10-CM

## 2021-01-10 NOTE — Progress Notes (Signed)
Patient: Anna Barker  Service Category: E/M  Provider: [name], MD  DOB: [birth year]  DOS: 01/10/2021  Location: Office  MRN: [id number]  Setting: Ambulatory outpatient  Referring Provider: No ref. provider found  Type: Established Patient  Specialty: Interventional Pain Management  PCP: Patient, No Pcp Per (Inactive)  Location: Remote location  Delivery: TeleHealth     Virtual Encounter - Pain Management PROVIDER NOTE: Information contained herein reflects review and annotations entered in association with encounter. Interpretation of such information and data should be left to medically-trained personnel. Information provided to patient can be located elsewhere in the medical record under "Patient Instructions". Document created using STT-dictation technology, any transcriptional errors that may result from process are unintentional.    Contact & Pharmacy Preferred: [phone] Home: [phone] (home) Mobile: [phone] (mobile) E-mail: [email or web address] .[facility] [address] [place] ([place])  [address] Phone: [phone] Fax: [phone]   Pre-screening  Anna Barker offered "in-person" vs "virtual" encounter. She indicated preferring virtual for this encounter.   Reason COVID-19*  Social distancing based on CDC and AMA recommendations.   I contacted Anna Barker on 01/10/2021 via telephone.      I clearly identified myself as [name], MD. I verified that I was speaking with the correct person using two identifiers (Name: Anna Barker, and date of birth: 1980-02-21).  Consent I sought verbal advanced consent from Anna Barker for virtual visit interactions. I informed Anna Barker of possible security and privacy concerns, risks, and limitations associated with providing "not-in-person" medical evaluation and management services. I also informed Anna Barker of the availability of  "in-person" appointments. Finally, I informed her that there would be a charge for the virtual visit and that she could be  personally, fully or partially, financially responsible for it. Anna Barker expressed understanding and agreed to proceed.   Historic Elements   Anna Barker is a 40 y.o. year old, female patient evaluated today after our last contact on 11/17/2020. Anna Barker  has a past medical history of Arthritis and Hypertension. She also  has a past surgical history that includes Cholecystectomy. Anna Barker has a current medication list which includes the following prescription(s): acetaminophen, diclofenac sodium, duloxetine, hydrochlorothiazide, ibuprofen, methocarbamol, multivitamin, tizanidine, and tobramycin. She  reports that she has never smoked. She has never used smokeless tobacco. She reports current alcohol use of about 3.0 standard drinks per week. She reports current drug use. Frequency: 7.00 times per week. Drug: Marijuana. Anna Barker has No Known Allergies.   HPI  Today, she is being contacted for worsening of previously known (established) problem  Patient is endorsing return of left knee pain as well as onset of right knee pain.  She is status post left knee genicular nerve block on 10/13/2020.  She states that this provided her with approximately 75 to 80% pain relief for almost 8 weeks at which point her pain started to return.  She followed up with orthopedics who recommended continued bracing and injections as needed.  She is also complaining of right knee pain as well, worse with weightbearing.  We discussed repeating bilateral genicular nerve block.  Risks and benefits reviewed and patient would like to proceed.  Future considerations include genicular nerve radiofrequency ablation.  Patient wants to hold off on that right now and proceed with repeat genicular nerve block bilaterally.  Laboratory Chemistry Profile   Renal Lab Results  Component Value Date   BUN  9  04/18/2016   CREATININE 0.64 04/18/2016   GFRAA >60 04/18/2016   GFRNONAA >60 04/18/2016    Hepatic Lab Results  Component Value Date   AST 21 04/18/2016   ALT 15 04/18/2016   ALBUMIN 4.0 04/18/2016   ALKPHOS 54 04/18/2016    Electrolytes Lab Results  Component Value Date   NA 138 04/18/2016   K 3.3 (L) 04/18/2016   CL 106 04/18/2016   CALCIUM 9.0 04/18/2016    Bone No results found for: VD25OH, VD125OH2TOT, PJ0315XY5, OP9292KM6, 25OHVITD1, 25OHVITD2, 25OHVITD3, TESTOFREE, TESTOSTERONE  Inflammation (CRP: Acute Phase) (ESR: Chronic Phase) No results found for: CRP, ESRSEDRATE, LATICACIDVEN       Note: Above Lab results reviewed.   Assessment  The primary encounter diagnosis was Chronic pain of left knee. Diagnoses of Primary osteoarthritis of left knee, Primary osteoarthritis of right knee, and Chronic pain of right knee were also pertinent to this visit.  Plan of Care    I have encouraged her to continue with rest ice compression and elevation of her knee as well  Orders:  Orders Placed This Encounter  Procedures   GENICULAR NERVE BLOCK    Indication(s):  Sub-acute knee pain    Standing Status:   Future    Standing Expiration Date:   04/10/2021    Scheduling Instructions:     Side: Bilateral     Sedation: PO Valium     Timeframe: As soon as schedule allows    Order Specific Question:   Where will this procedure be performed?    Answer:   [facility]    Follow-up plan:   Return in about 9 days (around 01/19/2021) for B/L GNB , minimal sedation (PO Valium).     Left knee genicular nerve block 10/13/20     Recent Visits Date Type Provider Dept  11/17/20 Office Visit [name], MD [facility]  10/13/20 Procedure visit [name], MD [facility]  Showing recent visits within past 90 days and meeting all other requirements Today's Visits Date Type Provider Dept  01/10/21 Office Visit [name], MD [facility]   Showing today's visits and meeting all other requirements Future Appointments No visits were found meeting these conditions. Showing future appointments within next 90 days and meeting all other requirements I discussed the assessment and treatment plan with the patient. The patient was provided an opportunity to ask questions and all were answered. The patient agreed with the plan and demonstrated an understanding of the instructions.  Patient advised to call back or seek an in-person evaluation if the symptoms or condition worsens.  Duration of encounter: 56mnutes.  Note by: BGillis Santa MD Date: 01/10/2021; Time: 1:54 PM

## 2021-01-11 NOTE — Patient Instructions (Addendum)
______________________________________________________________________  Preparing for your procedure (without sedation)  Procedure appointments are limited to planned procedures: No Prescription Refills. No disability issues will be discussed. No medication changes will be discussed.  Instructions: Oral Intake: Do not eat or drink anything for at least 6 hours prior to your procedure. (Exception: Blood Pressure Medication. See below.) Transportation: Unless otherwise stated by your physician, you may drive yourself after the procedure. Blood Pressure Medicine: Do not forget to take your blood pressure medicine with a sip of water the morning of the procedure. If your Diastolic (lower reading)is above 100 mmHg, elective cases will be cancelled/rescheduled. Blood thinners: These will need to be stopped for procedures. Notify our staff if you are taking any blood thinners. Depending on which one you take, there will be specific instructions on how and when to stop it. Diabetics on insulin: Notify the staff so that you can be scheduled 1st case in the morning. If your diabetes requires high dose insulin, take only  of your normal insulin dose the morning of the procedure and notify the staff that you have done so. Preventing infections: Shower with an antibacterial soap the morning of your procedure.  Build-up your immune system: Take 1000 mg of Vitamin C with every meal (3 times a day) the day prior to your procedure. Antibiotics: Inform the staff if you have a condition or reason that requires you to take antibiotics before dental procedures. Pregnancy: If you are pregnant, call and cancel the procedure. Sickness: If you have a cold, fever, or any active infections, call and cancel the procedure. Arrival: You must be in the facility at least 30 minutes prior to your scheduled procedure. Children: Do not bring any children with you. Dress appropriately: Bring dark clothing that you would not mind  if they get stained. Valuables: Do not bring any jewelry or valuables.  Reasons to call and reschedule or cancel your procedure: (Following these recommendations will minimize the risk of a serious complication.) Surgeries: Avoid having procedures within 2 weeks of any surgery. (Avoid for 2 weeks before or after any surgery). Flu Shots: Avoid having procedures within 2 weeks of a flu shots or . (Avoid for 2 weeks before or after immunizations). Barium: Avoid having a procedure within 7-10 days after having had a radiological study involving the use of radiological contrast. (Myelograms, Barium swallow or enema study). Heart attacks: Avoid any elective procedures or surgeries for the initial 6 months after a "Myocardial Infarction" (Heart Attack). Blood thinners: It is imperative that you stop these medications before procedures. Let us know if you if you take any blood thinner.  Infection: Avoid procedures during or within two weeks of an infection (including chest colds or gastrointestinal problems). Symptoms associated with infections include: Localized redness, fever, chills, night sweats or profuse sweating, burning sensation when voiding, cough, congestion, stuffiness, runny nose, sore throat, diarrhea, nausea, vomiting, cold or Flu symptoms, recent or current infections. It is specially important if the infection is over the area that we intend to treat. Heart and lung problems: Symptoms that may suggest an active cardiopulmonary problem include: cough, chest pain, breathing difficulties or shortness of breath, dizziness, ankle swelling, uncontrolled high or unusually low blood pressure, and/or palpitations. If you are experiencing any of these symptoms, cancel your procedure and contact your primary care physician for an evaluation.  Remember:  Regular Business hours are:  Monday to Thursday 8:00 AM to 4:00 PM  Provider's Schedule: Francisco Naveira, MD:  Procedure days: Tuesday and Thursday    7:30 AM to 4:00 PM  Edward Jolly, MD:  Procedure days: Monday and Wednesday 7:30 AM to 4:00 PM ______________________________________________________________________  ____________________________________________________________________________________________  Genicular Nerve Block  What is a genicular nerve block? A genicular nerve block is the injection of a local anesthetic to block the nerves that transmits pain from the knee.  What is the purpose of a facet nerve block? A genicular nerve block is a diagnostic procedure to determine if the pathologic changes (i.e. arthritis, meniscal tears, etc) and inflammation within the knee joint is the source of your knee pain. It also confirms that the knee pain will respond well to the actual treatment procedure. If a genicular nerve block works, it will give you relief for several hours. After that, the pain is expected to return to normal. This test is always performed twice (usually a week or two apart) because two successful tests are required to move onto treatment. If both diagnostic tests are positive, then we schedule a treatment called radiofrequency (RF) ablation. In this procedure, the same nerves are cauterized, which typically leads to pain relief for 4 -18 months. If this process works well for one knee, it can be performed on the other knee if needed.  How is the procedure performed? You will be placed on the procedure table. The injection site is sterilized with either iodine or chlorhexadine. The site to be injected is numbed with a local anesthetic, and a needle is directed to the target area. X-ray guidance is used to ensure proper placement and positioning of the needle. When the needle is properly positioned near the genicular nerve, local anesthetic is injected to numb that nerve. This will be repeated at multiple sites around the knee to block all genicular nerves.  Will the procedure be painful? The injection can be painful and  we therefore provide the option of receiving IV sedation. IV sedation, combined with local anesthetic, can make the injection nearly pain free. It allows you to remain very still during the procedure, which can also make the injection easier, faster, and more successful. If you decide to have IV sedation, you must have a driver to get you home safely afterwards. In addition, you cannot have anything to eat or drink within 8 hours of your appointment (clear liquids are allowed until 3 hours before the procedure). If you take medications for diabetes, these medications may need to be adjusted the morning of the procedure. Your primary care physician can help you with this adjustment.  What are the discharge instructions? If you received IV sedation do not drive or operate machinery for at least 24 hours after the procedure. You may return to work the next day following your procedure. You may resume your normal diet immediately. Do not engage in any strenuous activity for 24 hours. You should, however, engage in moderate activity that typically causes your ususal pain. If the block works, those activities should not be painful for several hours after the injection. Do not take a bath, swim, or use a hot tub for 24 hours (you may take a shower). Call the office if you have any of the following: severe pain afterwards (different than your usual symptoms), redness/swelling/discharge at the injection site(s), fevers/chills, difficulty with bowel or bladder functions.  What are the risks and side effects? The complication rate for this procedure is very low. Whenever a needle enters the skin, bleeding or infection can occur. Some other serious but extremely rare risks include paralysis and death. You may have  an allergic reaction to any of the medications used. If you have a known allergy to any medications, especially local anesthetics, notify our staff before the procedure takes place. You may experience any of  the following side effects up to 4 - 6 hours after the procedure: Leg muscle weakness or numbness may occur due to the local anesthetic affecting the nerves that control your legs (this is a temporary affect and it is not paralysis). If you have any leg weakness or numbness, walk only with assistance in order to prevent falls and injury. Your leg strength will return slowly and completely. Dizziness may occur due to a decrease in your blood pressure. If this occurs, remain in a seated or lying position. Gradually sit up, and then stand after at least 10 minutes of sitting. Mild headaches may occur. Drink fluids and take pain medications if needed. If the headaches persist or become severe, call the office. Mild discomfort at the injection site can occur. This typically lasts for a few hours but can persist for a couple days. If this occurs, take anti-inflammatories or pain medications, apply ice to the area the day of the procedure. If it persists, apply moist heat in the day(s) following.  The side effects listed above can be normal. They are not dangerous and will resolve on their own. If, however, you experience any of the following, a complication may have occurred and you should either contact your doctor. If he is not readily available, then you should proceed to the closest urgent care center for evaluation: Severe or progressive pain at the injection site(s) Arm or leg weakness that progressively worsens or persists for longer than 8 hours Severe or progressive redness, swelling, or discharge from the injections site(s) Fevers, chills, nausea, or vomiting Bowel or bladder dysfunction (i.e. inability to urinate or pass stool or difficulty controlling either)  How long does it take for the procedure to work? You should feel relief from your usual pain within the first hour. Again, this is only expected to last for several hours, at the most. Remember, you may be sore in the middle part of your  back from the needles, and you must distinguish this from your usual pain. ____________________________________________________________________________________________

## 2021-01-17 ENCOUNTER — Ambulatory Visit
Admission: RE | Admit: 2021-01-17 | Discharge: 2021-01-17 | Disposition: A | Discharge status: Home / Self Care | Payer: Medicaid Other | Source: Ambulatory Visit | Attending: Student in an Organized Health Care Education/Training Program | Admitting: Student in an Organized Health Care Education/Training Program

## 2021-01-17 ENCOUNTER — Encounter: Payer: Self-pay | Admitting: Student in an Organized Health Care Education/Training Program

## 2021-01-17 ENCOUNTER — Other Ambulatory Visit: Payer: Self-pay

## 2021-01-17 ENCOUNTER — Ambulatory Visit (HOSPITAL_BASED_OUTPATIENT_CLINIC_OR_DEPARTMENT_OTHER): Payer: Medicaid Other | Admitting: Student in an Organized Health Care Education/Training Program

## 2021-01-17 DIAGNOSIS — M25562 Pain in left knee: Secondary | ICD-10-CM

## 2021-01-17 DIAGNOSIS — M25561 Pain in right knee: Secondary | ICD-10-CM | POA: Insufficient documentation

## 2021-01-17 DIAGNOSIS — M1711 Unilateral primary osteoarthritis, right knee: Secondary | ICD-10-CM

## 2021-01-17 DIAGNOSIS — G8929 Other chronic pain: Secondary | ICD-10-CM | POA: Insufficient documentation

## 2021-01-17 DIAGNOSIS — M1712 Unilateral primary osteoarthritis, left knee: Secondary | ICD-10-CM

## 2021-01-17 MED ORDER — DEXAMETHASONE SODIUM PHOSPHATE 10 MG/ML IJ SOLN
10.0000 mg | Freq: Once | INTRAMUSCULAR | Status: AC
  Administered 2021-01-17: 10 mg

## 2021-01-17 MED ORDER — LIDOCAINE HCL 2 % IJ SOLN
INTRAMUSCULAR | Status: AC
  Filled 2021-01-17: qty 10

## 2021-01-17 MED ORDER — ROPIVACAINE HCL 2 MG/ML IJ SOLN
9.0000 mL | Freq: Once | INTRAMUSCULAR | Status: AC
  Administered 2021-01-17: 9 mL via PERINEURAL

## 2021-01-17 MED ORDER — DIAZEPAM 5 MG PO TABS
ORAL_TABLET | ORAL | Status: AC
  Filled 2021-01-17: qty 2

## 2021-01-17 MED ORDER — LIDOCAINE HCL 2 % IJ SOLN
20.0000 mL | Freq: Once | INTRAMUSCULAR | Status: AC
  Administered 2021-01-17: 200 mg

## 2021-01-17 MED ORDER — ROPIVACAINE HCL 2 MG/ML IJ SOLN
INTRAMUSCULAR | Status: AC
  Filled 2021-01-17: qty 20

## 2021-01-17 MED ORDER — DEXAMETHASONE SODIUM PHOSPHATE 10 MG/ML IJ SOLN
INTRAMUSCULAR | Status: AC
  Filled 2021-01-17: qty 2

## 2021-01-17 MED ORDER — DIAZEPAM 5 MG PO TABS: 10.0000 mg | ORAL_TABLET | ORAL | Status: DC

## 2021-01-17 NOTE — Progress Notes (Signed)
PROVIDER NOTE: Information contained herein reflects review and annotations entered in association with encounter. Interpretation of such information and data should be left to medically-trained personnel. Information provided to patient can be located elsewhere in the medical record under "Patient Instructions". Document created using STT-dictation technology, any transcriptional errors that may result from process are unintentional.    Patient: Anna Barker  Service Category: Procedure  Provider: Edward Jolly, MD  DOB: 06/02/1980  DOS: 01/17/2021  Location: [facility]  MRN: [id number]  Setting: Ambulatory - outpatient  Referring Provider: Edward Jolly, MD  Type: Established Patient  Specialty: Interventional Pain Management  PCP: Patient, No Pcp Per (Inactive)   Primary Reason for Visit: Interventional Pain Management Treatment. CC: Knee Pain (both)    Procedure:          Anesthesia, Analgesia, Anxiolysis:  Type: Genicular Nerves Block (Superolateral, Superomedial, and Inferomedial Genicular Nerves) #2  CPT: 64450-50 Primary Purpose: Diagnostic/Therapeutric Region: Lateral, Anterior, and Medial aspects of the knee joint, above and below the knee joint proper. Level: Superior and inferior to the knee joint. Target Area: For Genicular Nerve block(s), the targets are: the superolateral genicular nerve, located in the lateral distal portion of the femoral shaft as it curves to form the lateral epicondyle, in the region of the distal femoral metaphysis; the superomedial genicular nerve, located in the medial distal portion of the femoral shaft as it curves to form the medial epicondyle; and the inferomedial genicular nerve, located in the medial, proximal portion of the tibial shaft, as it curves to form the medial epicondyle, in the region of the proximal tibial metaphysis. Approach: Anterior, percutaneous, ipsilateral approach. Laterality: Bilateral  Type: Local Anesthesia with  5 mg PO Valium Local Anesthetic: Lidocaine 1-2%   Position: Modified Fowler's position with pillows under the targeted knee(s).   Indications: 1. Chronic pain of left knee   2. Primary osteoarthritis of left knee   3. Primary osteoarthritis of right knee   4. Chronic pain of right knee    Pain Score: Pre-procedure: 9 /10 Post-procedure: 0-No pain/10     Pre-op H&P Assessment:  Anna Barker is a 40 y.o. (year old), female patient, seen today for interventional treatment. She  has a past surgical history that includes Cholecystectomy. Anna Barker has a current medication list which includes the following prescription(s): hydrochlorothiazide, tizanidine, acetaminophen, diclofenac sodium, duloxetine, ibuprofen, methocarbamol, multivitamin, and tobramycin. Her primarily concern today is the Knee Pain (both)  Initial Vital Signs:  Pulse/HCG Rate: 60ECG Heart Rate: 61 Temp: (!) 97.3 F (36.3 C) Resp: 17 BP: (!) 140/98 SpO2: 100 %  BMI: Estimated body mass index is 42.44 kg/m as calculated from the following:   Height as of this encounter: 5\' 7"  (1.702 m).   Weight as of this encounter: 271 lb (122.9 kg).  Risk Assessment: Allergies: Reviewed. She has No Known Allergies.  Allergy Precautions: None required Coagulopathies: Reviewed. None identified.  Blood-thinner therapy: None at this time Active Infection(s): Reviewed. None identified. Ms. is afebrile  Site Confirmation: Anna Barker was asked to confirm the procedure and laterality before marking the site Procedure checklist: Completed Consent: Before the procedure and under the influence of no sedative(s), amnesic(s), or anxiolytics, the patient was informed of the treatment options, risks and possible complications. To fulfill our ethical and legal obligations, as recommended by the American Medical Association's Code of Ethics, I have informed the patient of my clinical impression; the nature and purpose of the treatment  or procedure; the risks,  benefits, and possible complications of the intervention; the alternatives, including doing nothing; the risk(s) and benefit(s) of the alternative treatment(s) or procedure(s); and the risk(s) and benefit(s) of doing nothing. The patient was provided information about the general risks and possible complications associated with the procedure. These may include, but are not limited to: failure to achieve desired goals, infection, bleeding, organ or nerve damage, allergic reactions, paralysis, and death. In addition, the patient was informed of those risks and complications associated to the procedure, such as failure to decrease pain; infection; bleeding; organ or nerve damage with subsequent damage to sensory, motor, and/or autonomic systems, resulting in permanent pain, numbness, and/or weakness of one or several areas of the body; allergic reactions; (i.e.: anaphylactic reaction); and/or death. Furthermore, the patient was informed of those risks and complications associated with the medications. These include, but are not limited to: allergic reactions (i.e.: anaphylactic or anaphylactoid reaction(s)); adrenal axis suppression; blood sugar elevation that in diabetics may result in ketoacidosis or comma; water retention that in patients with history of congestive heart failure may result in shortness of breath, pulmonary edema, and decompensation with resultant heart failure; weight gain; swelling or edema; medication-induced neural toxicity; particulate matter embolism and blood vessel occlusion with resultant organ, and/or nervous system infarction; and/or aseptic necrosis of one or more joints. Finally, the patient was informed that Medicine is not an exact science; therefore, there is also the possibility of unforeseen or unpredictable risks and/or possible complications that may result in a catastrophic outcome. The patient indicated having understood very clearly. We have given  the patient no guarantees and we have made no promises. Enough time was given to the patient to ask questions, all of which were answered to the patient's satisfaction. Anna Barker has indicated that she wanted to continue with the procedure. Attestation: I, the ordering provider, attest that I have discussed with the patient the benefits, risks, side-effects, alternatives, likelihood of achieving goals, and potential problems during recovery for the procedure that I have provided informed consent. Date  Time: 01/17/2021  9:06 AM  Pre-Procedure Preparation:  Monitoring: As per clinic protocol. Respiration, ETCO2, SpO2, BP, heart rate and rhythm monitor placed and checked for adequate function Safety Precautions: Patient was assessed for positional comfort and pressure points before starting the procedure. Time-out: I initiated and conducted the "Time-out" before starting the procedure, as per protocol. The patient was asked to participate by confirming the accuracy of the "Time Out" information. Verification of the correct person, site, and procedure were performed and confirmed by me, the nursing staff, and the patient. "Time-out" conducted as per Joint Commission's Universal Protocol (UP.01.01.01). Time: 0934  Description of Procedure:          Area Prepped: Entire knee area, from mid-thigh to mid-shin, lateral, anterior, and medial aspects. DuraPrep (Iodine Povacrylex [0.7% available iodine] and Isopropyl Alcohol, 74% w/w) Safety Precautions: Aspiration looking for blood return was conducted prior to all injections. At no point did we inject any substances, as a needle was being advanced. No attempts were made at seeking any paresthesias. Safe injection practices and needle disposal techniques used. Medications properly checked for expiration dates. SDV (single dose vial) medications used. Description of the Procedure: Protocol guidelines were followed. The patient was placed in position over the  procedure table. The target area was identified and the area prepped in the usual manner. Skin & deeper tissues infiltrated with local anesthetic. Appropriate amount of time allowed to pass for local anesthetics to take effect. The  procedure needles were then advanced to the target area. Proper needle placement secured. Negative aspiration confirmed. Solution injected in intermittent fashion, asking for systemic symptoms every 0.5cc of injectate. The needles were then removed and the area cleansed, making sure to leave some of the prepping solution back to take advantage of its long term bactericidal properties.  Vitals:   01/17/21 0933 01/17/21 0938 01/17/21 0943 01/17/21 0947  BP: (!) 147/103 (!) 145/97 (!) 137/99 (!) 151/102  Pulse:      Resp: Temp:      SpO2: 100% 100% 100% 100%  Weight:      Height:        Start Time: 0934 hrs. End Time: 0946 hrs. Materials:  Needle(s) Type: Spinal Needle Gauge: 25G Length: 3.5-in Medication(s): Please see orders for medications and dosing details. 6 cc solution made of 5 cc of 0.2% ropivacaine, 1 cc of Decadron 10 mg/cc. 2 cc injected at each level above for the left genicular nerves 6 cc solution made of 5 cc of 0.2% ropivacaine, 1 cc of Decadron 10 mg/cc. 2 cc injected at each level above for the right genicular nerves   Total steroid: 20 mg Decadron Imaging Guidance (Non-Spinal):          Type of Imaging Technique: Fluoroscopy Guidance (Non-Spinal) Indication(s): Assistance in needle guidance and placement for procedures requiring needle placement in or near specific anatomical locations not easily accessible without such assistance. Exposure Time: Please see nurses notes. Contrast: None used. Fluoroscopic Guidance: I was personally present during the use of fluoroscopy. "Tunnel Vision Technique" used to obtain the best possible view of the target area. Parallax error corrected before commencing the procedure.  "Direction-depth-direction" technique used to introduce the needle under continuous pulsed fluoroscopy. Once target was reached, antero-posterior, oblique, and lateral fluoroscopic projection used confirm needle placement in all planes. Images permanently stored in EMR. Interpretation: No contrast injected. I personally interpreted the imaging intraoperatively. Adequate needle placement confirmed in multiple planes. Permanent images saved into the patient's record.  Post-operative Assessment:  Post-procedure Vital Signs:  Pulse/HCG Rate: 60(!) 54 Temp: (!) 97.3 F (36.3 C) Resp: 16 BP: (!) 151/102 SpO2: 100 %  EBL: None  Complications: No immediate post-treatment complications observed by team, or reported by patient.  Note: The patient tolerated the entire procedure well. A repeat set of vitals were taken after the procedure and the patient was kept under observation following institutional policy, for this type of procedure. Post-procedural neurological assessment was performed, showing return to baseline, prior to discharge. The patient was provided with post-procedure discharge instructions, including a section on how to identify potential problems. Should any problems arise concerning this procedure, the patient was given instructions to immediately contact us, at any time, without hesitation. In any case, we plan to contact the patient by telephone for a follow-up status report regarding this interventional procedure.  Comments:  No additional relevant information.  Plan of Care  Orders:  Orders Placed This Encounter  Procedures   DG PAIN CLIN[facility] C-ARM 1-60 MIN NO REPORT    Intraoperative interpretation by procedural physician at [facility].    Standing Status:   Standing    Number of Occurrences:   1    Order Specific Question:   Reason for exam:    Answer:   Assistance in needle guidance and placement for procedures requiring needle placement in or near specific  anatomical locations not easily accessible without such assistance.  Medications ordered for procedure: Meds ordered this encounter  Medications   DISCONTD: diazepam (VALIUM) tablet 10 mg    Make sure Flumazenil is available in the pyxis when using this medication. If oversedation occurs, administer 0.2 mg IV over 15 sec. If after 45 sec no response, administer 0.2 mg again over 1 min; may repeat at 1 min intervals; not to exceed 4 doses (1 mg)   lidocaine (XYLOCAINE) 2 % (with pres) injection 400 mg   dexamethasone (DECADRON) injection 10 mg   dexamethasone (DECADRON) injection 10 mg   ropivacaine (PF) 2 mg/mL (0.2%) (NAROPIN) injection 9 mL   ropivacaine (PF) 2 mg/mL (0.2%) (NAROPIN) injection 9 mL   Medications administered: We administered lidocaine, dexamethasone, dexamethasone, ropivacaine (PF) 2 mg/mL (0.2%), and ropivacaine (PF) 2 mg/mL (0.2%).  See the medical record for exact dosing, route, and time of administration.  Follow-up plan:   Return in about 6 weeks (around 02/28/2021) for Post Procedure Evaluation, virtual.       Left knee genicular nerve block 10/13/20, bilateral GNB 01/17/21   Recent Visits Date Type Provider Dept  01/10/21 Office Visit Edward Jolly, MD [facility]  11/17/20 Office Visit Edward Jolly, MD [facility]  Showing recent visits within past 90 days and meeting all other requirements Today's Visits Date Type Provider Dept  01/17/21 Procedure visit Edward Jolly, MD [facility]  Showing today's visits and meeting all other requirements Future Appointments Date Type Provider Dept  02/28/21 Appointment Edward Jolly, MD [facility]  Showing future appointments within next 90 days and meeting all other requirements Disposition: Discharge home  Discharge (Date  Time): 01/17/2021; 1000 hrs.   Primary Care Physician: Patient, No Pcp Per (Inactive) Location: [facility] Note by:  Edward Jolly, MD Date: 01/17/2021; Time: 9:53 AM  Disclaimer:  Medicine is not an exact science. The only guarantee in medicine is that nothing is guaranteed. It is important to note that the decision to proceed with this intervention was based on the information collected from the patient. The Data and conclusions were drawn from the patient's questionnaire, the interview, and the physical examination. Because the information was provided in large part by the patient, it cannot be guaranteed that it has not been purposely or unconsciously manipulated. Every effort has been made to obtain as much relevant data as possible for this evaluation. It is important to note that the conclusions that lead to this procedure are derived in large part from the available data. Always take into account that the treatment will also be dependent on availability of resources and existing treatment guidelines, considered by other Pain Management Practitioners as being common knowledge and practice, at the time of the intervention. For Medico-Legal purposes, it is also important to point out that variation in procedural techniques and pharmacological choices are the acceptable norm. The indications, contraindications, technique, and results of the above procedure should only be interpreted and judged by a Board-Certified Interventional Pain Specialist with extensive familiarity and expertise in the same exact procedure and technique.

## 2021-01-17 NOTE — Patient Instructions (Signed)
____________________________________________________________________________________________  Genicular Nerve Block  What is a genicular nerve block? A genicular nerve block is the injection of a local anesthetic to block the nerves that transmits pain from the knee.  What is the purpose of a facet nerve block? A genicular nerve block is a diagnostic procedure to determine if the pathologic changes (i.e. arthritis, meniscal tears, etc) and inflammation within the knee joint is the source of your knee pain. It also confirms that the knee pain will respond well to the actual treatment procedure. If a genicular nerve block works, it will give you relief for several hours. After that, the pain is expected to return to normal. This test is always performed twice (usually a week or two apart) because two successful tests are required to move onto treatment. If both diagnostic tests are positive, then we schedule a treatment called radiofrequency (RF) ablation. In this procedure, the same nerves are cauterized, which typically leads to pain relief for 4 -18 months. If this process works well for one knee, it can be performed on the other knee if needed.  How is the procedure performed? You will be placed on the procedure table. The injection site is sterilized with either iodine or chlorhexadine. The site to be injected is numbed with a local anesthetic, and a needle is directed to the target area. X-ray guidance is used to ensure proper placement and positioning of the needle. When the needle is properly positioned near the genicular nerve, local anesthetic is injected to numb that nerve. This will be repeated at multiple sites around the knee to block all genicular nerves.  Will the procedure be painful? The injection can be painful and we therefore provide the option of receiving IV sedation. IV sedation, combined with local anesthetic, can make the injection nearly pain free. It allows you to remain very  still during the procedure, which can also make the injection easier, faster, and more successful. If you decide to have IV sedation, you must have a driver to get you home safely afterwards. In addition, you cannot have anything to eat or drink within 8 hours of your appointment (clear liquids are allowed until 3 hours before the procedure). If you take medications for diabetes, these medications may need to be adjusted the morning of the procedure. Your primary care physician can help you with this adjustment.  What are the discharge instructions? If you received IV sedation do not drive or operate machinery for at least 24 hours after the procedure. You may return to work the next day following your procedure. You may resume your normal diet immediately. Do not engage in any strenuous activity for 24 hours. You should, however, engage in moderate activity that typically causes your ususal pain. If the block works, those activities should not be painful for several hours after the injection. Do not take a bath, swim, or use a hot tub for 24 hours (you may take a shower). Call the office if you have any of the following: severe pain afterwards (different than your usual symptoms), redness/swelling/discharge at the injection site(s), fevers/chills, difficulty with bowel or bladder functions.  What are the risks and side effects? The complication rate for this procedure is very low. Whenever a needle enters the skin, bleeding or infection can occur. Some other serious but extremely rare risks include paralysis and death. You may have an allergic reaction to any of the medications used. If you have a known allergy to any medications, especially local anesthetics, notify   our staff before the procedure takes place. You may experience any of the following side effects up to 4 - 6 hours after the procedure: Leg muscle weakness or numbness may occur due to the local anesthetic affecting the nerves that control  your legs (this is a temporary affect and it is not paralysis). If you have any leg weakness or numbness, walk only with assistance in order to prevent falls and injury. Your leg strength will return slowly and completely. Dizziness may occur due to a decrease in your blood pressure. If this occurs, remain in a seated or lying position. Gradually sit up, and then stand after at least 10 minutes of sitting. Mild headaches may occur. Drink fluids and take pain medications if needed. If the headaches persist or become severe, call the office. Mild discomfort at the injection site can occur. This typically lasts for a few hours but can persist for a couple days. If this occurs, take anti-inflammatories or pain medications, apply ice to the area the day of the procedure. If it persists, apply moist heat in the day(s) following.  The side effects listed above can be normal. They are not dangerous and will resolve on their own. If, however, you experience any of the following, a complication may have occurred and you should either contact your doctor. If he is not readily available, then you should proceed to the closest urgent care center for evaluation: Severe or progressive pain at the injection site(s) Arm or leg weakness that progressively worsens or persists for longer than 8 hours Severe or progressive redness, swelling, or discharge from the injections site(s) Fevers, chills, nausea, or vomiting Bowel or bladder dysfunction (i.e. inability to urinate or pass stool or difficulty controlling either)  How long does it take for the procedure to work? You should feel relief from your usual pain within the first hour. Again, this is only expected to last for several hours, at the most. Remember, you may be sore in the middle part of your back from the needles, and you must distinguish this from your usual pain. ____________________________________________________________________________________________   Pain Management Discharge Instructions  General Discharge Instructions :  If you need to reach your doctor call: Monday-Friday 8:00 am - 4:00 pm at [phone] or toll free [phone].  After clinic hours [phone] to have operator reach doctor.  Bring all of your medication bottles to all your appointments in the pain clinic.  To cancel or reschedule your appointment with Pain Management please remember to call 24 hours in advance to avoid a fee.  Refer to the educational materials which you have been given on: General Risks, I had my Procedure. Discharge Instructions, Post Sedation.  Post Procedure Instructions:  The drugs you were given will stay in your system until tomorrow, so for the next 24 hours you should not drive, make any legal decisions or drink any alcoholic beverages.  You may eat anything you prefer, but it is better to start with liquids then soups and crackers, and gradually work up to solid foods.  Please notify your doctor immediately if you have any unusual bleeding, trouble breathing or pain that is not related to your normal pain.  Depending on the type of procedure that was done, some parts of your body may feel week and/or numb.  This usually clears up by tonight or the next day.  Walk with the use of an assistive device or accompanied by an adult for the 24 hours.  You may use ice   on the affected area for the first 24 hours.  Put ice in a Ziploc bag and cover with a towel and place against area 15 minutes on 15 minutes off.  You may switch to heat after 24 hours. 

## 2021-01-17 NOTE — Progress Notes (Signed)
Safety precautions to be maintained throughout the outpatient stay will include: orient to surroundings, keep bed in low position, maintain call bell within reach at all times, provide assistance with transfer out of bed and ambulation.  

## 2021-01-18 ENCOUNTER — Telehealth: Payer: Self-pay | Admitting: *Deleted

## 2021-01-18 NOTE — Telephone Encounter (Signed)
Post procedure call;  right knee feels good and has no pain.  Left knee hurts to touch and aching pain. Patient had genicular nerve block bilaterally.  Explained the rational with steroids and numbing agents.  Patient will continue to monitor.  Will call if there are any questions or concerns.

## 2021-02-09 ENCOUNTER — Telehealth: Payer: Self-pay

## 2021-02-09 NOTE — Telephone Encounter (Signed)
LM for patient to call office for pre virtual appointment questions.  

## 2021-02-09 NOTE — Progress Notes (Signed)
Patient has a hx of CTS bilaterally.  She is having difficulty currently using her hands I.e. cranking car, twisting off gas cap.  Hands ache, throb and burns mainly in the between the index and thumb bilaterally.

## 2021-02-10 ENCOUNTER — Encounter: Payer: Self-pay | Admitting: Student in an Organized Health Care Education/Training Program

## 2021-02-10 ENCOUNTER — Ambulatory Visit
Payer: Medicaid Other | Attending: Student in an Organized Health Care Education/Training Program | Admitting: Student in an Organized Health Care Education/Training Program

## 2021-02-10 ENCOUNTER — Other Ambulatory Visit: Payer: Self-pay

## 2021-02-10 DIAGNOSIS — M25561 Pain in right knee: Secondary | ICD-10-CM

## 2021-02-10 DIAGNOSIS — G5603 Carpal tunnel syndrome, bilateral upper limbs: Secondary | ICD-10-CM

## 2021-02-10 DIAGNOSIS — M1712 Unilateral primary osteoarthritis, left knee: Secondary | ICD-10-CM | POA: Diagnosis not present

## 2021-02-10 DIAGNOSIS — M1711 Unilateral primary osteoarthritis, right knee: Secondary | ICD-10-CM | POA: Insufficient documentation

## 2021-02-10 DIAGNOSIS — G8929 Other chronic pain: Secondary | ICD-10-CM

## 2021-02-10 DIAGNOSIS — M25562 Pain in left knee: Secondary | ICD-10-CM | POA: Diagnosis not present

## 2021-02-10 DIAGNOSIS — G5601 Carpal tunnel syndrome, right upper limb: Secondary | ICD-10-CM | POA: Insufficient documentation

## 2021-02-10 NOTE — Progress Notes (Signed)
Patient: Anna Barker  Service Category: E/M  Provider: [name], MD  DOB: 1980/03/30  DOS: 02/10/2021  Location: Office  MRN: [id number]  Setting: Ambulatory outpatient  Referring Provider: No ref. provider found  Type: Established Patient  Specialty: Interventional Pain Management  PCP: Patient, No Pcp Per (Inactive)  Location: Home  Delivery: TeleHealth     Virtual Encounter - Pain Management PROVIDER NOTE: Information contained herein reflects review and annotations entered in association with encounter. Interpretation of such information and data should be left to medically-trained personnel. Information provided to patient can be located elsewhere in the medical record under "Patient Instructions". Document created using STT-dictation technology, any transcriptional errors that may result from process are unintentional.    Contact & Pharmacy Preferred: [phone] Home: [phone] (home) Mobile: [phone] (mobile) E-mail: [email or web address]  [place] ([place]), [place]) [place] [address] Phone: [phone] Fax: [phone]   Pre-screening  Anna Barker offered "in-person" vs "virtual" encounter. She indicated preferring virtual for this encounter.   Reason COVID-19*   Social distancing based on CDC and AMA recommendations.   I contacted Kirrah [name] on 02/10/2021 via telephone.      I clearly identified myself as [name], MD. I verified that I was speaking with the correct person using two identifiers (Name: Anna Barker, and date of birth: 06-10-1980).  Consent I sought verbal advanced consent from Laqueisha [name] for virtual visit interactions. I informed Anna Barker of possible security and privacy concerns, risks, and limitations associated with providing "not-in-person" medical evaluation and management services. I also informed Anna Barker of the availability of "in-person"  appointments. Finally, I informed her that there would be a charge for the virtual visit and that she could be  personally, fully or partially, financially responsible for it. Anna Barker expressed understanding and agreed to proceed.   Historic Elements   [name] is a 41 y.o. year old, female patient evaluated today after our last contact on 01/17/2021. Anna Barker  has a past medical history of Arthritis and Hypertension. She also  has a past surgical history that includes Cholecystectomy. Anna Barker has a current medication list which includes the following prescription(s): buspirone, escitalopram, hydrochlorothiazide, tizanidine, acetaminophen, diclofenac sodium, duloxetine, ibuprofen, methocarbamol, multivitamin, and tobramycin. She  reports that she has never smoked. She has never used smokeless tobacco. She reports current alcohol use of about 3.0 standard drinks per week. She reports current drug use. Frequency: 7.00 times per week. Drug: Marijuana. Anna Barker has No Known Allergies.   HPI  Today, she is being contacted for a post-procedure assessment. And increased bilateral hand pain due to CTS b/l. Patient has a hx of CTS bilaterally.  She is having difficulty currently using her hands I.e. cranking car, twisting off gas cap.  Hands ache, throb and burns mainly in the between the index and thumb bilaterally.    Post-procedure evaluation     Procedure:          Anesthesia, Analgesia, Anxiolysis:  Type: Genicular Nerves Block (Superolateral, Superomedial, and Inferomedial Genicular Nerves) #2  CPT: 64450-50 Primary Purpose: Diagnostic/Therapeutric Region: Lateral, Anterior, and Medial aspects of the knee joint, above and below the knee joint proper. Level: Superior and inferior to the knee joint. Target Area: For Genicular Nerve block(s), the targets are: the superolateral genicular nerve, located in the lateral distal portion of the femoral shaft as it curves to form the  lateral epicondyle, in the region  of the distal femoral metaphysis; the superomedial genicular nerve, located in the medial distal portion of the femoral shaft as it curves to form the medial epicondyle; and the inferomedial genicular nerve, located in the medial, proximal portion of the tibial shaft, as it curves to form the medial epicondyle, in the region of the proximal tibial metaphysis. Approach: Anterior, percutaneous, ipsilateral approach. Laterality: Bilateral  Type: Local Anesthesia with 5 mg PO Valium Local Anesthetic: Lidocaine 1-2%   Position: Modified Fowler's position with pillows under the targeted knee(s).   Indications: 1. Chronic pain of left knee   2. Primary osteoarthritis of left knee   3. Primary osteoarthritis of right knee   4. Chronic pain of right knee    Pain Score: Pre-procedure: 9 /10 Post-procedure: 0-No pain/10      Effectiveness:  Initial hour after procedure: 100 %  Subsequent 4-6 hours post-procedure: 100 % (right knee was numb for longer than the left.)  Analgesia past initial 6 hours: 100 % (right knee is  better, not as much pain.  left knee continues to pop out of place and gives out on her and she is having a lot of pain.)  Ongoing improvement:  Analgesic:  50-60% for the right knee, <30% for the left knee Function: Somewhat improved for right knee, not so much for left   Laboratory Chemistry Profile   Renal Lab Results  Component Value Date   BUN 9 04/18/2016   CREATININE 0.64 04/18/2016   GFRAA >60 04/18/2016   GFRNONAA >60 04/18/2016    Hepatic Lab Results  Component Value Date   AST 21 04/18/2016   ALT 15 04/18/2016   ALBUMIN 4.0 04/18/2016   ALKPHOS 54 04/18/2016    Electrolytes Lab Results  Component Value Date   NA 138 04/18/2016   K 3.3 (L) 04/18/2016   CL 106 04/18/2016   CALCIUM 9.0 04/18/2016    Bone No results found for: VD25OH, VD125OH2TOT, ZO1096EA5, WU9811BJ4, 25OHVITD1, 25OHVITD2, 25OHVITD3, TESTOFREE,  TESTOSTERONE  Inflammation (CRP: Acute Phase) (ESR: Chronic Phase) No results found for: CRP, ESRSEDRATE, LATICACIDVEN       Note: Above Lab results reviewed.   Assessment  The primary encounter diagnosis was Chronic pain of left knee. Diagnoses of Chronic pain of right knee, Bilateral carpal tunnel syndrome, Primary osteoarthritis of left knee, and Primary osteoarthritis of right knee were also pertinent to this visit.  Plan of Care   1. Chronic pain of left knee -Status post 2 positive diagnostic genicular nerve blocks.  Given return of left knee pain and instability, discussed left genicular nerve radiofrequency ablation.  Risk and benefits reviewed patient would like to proceed.  Plan to do with IV Versed - Radiofrequency,Genicular; Future  2. Chronic pain of right knee -Stable after previous right knee genicular nerve block, can consider RFA in future  3. Bilateral carpal tunnel syndrome -History of bilateral carpal tunnel syndrome that is worsening, referral to orthopedics as below - AMB referral to orthopedics  4. Primary osteoarthritis of left knee - Radiofrequency,Genicular; Future  5. Primary osteoarthritis of right knee -Continue to monitor, can consider right genicular RFA in future    Orders:  Orders Placed This Encounter  Procedures   Radiofrequency,Genicular    Standing Status:   Future    Standing Expiration Date:   02/10/2022    Scheduling Instructions:     Side(s): LEFT     Level(s): Superior-Lateral, Superior-Medial, and Inferior-Medial Genicular Nerve(s)     Sedation: IV Versed  Scheduling Timeframe: As soon as pre-approved    Order Specific Question:   Where will this procedure be performed?    Answer:   [facility]   AMB referral to orthopedics    Referral Priority:   Routine    Referral Type:   Consultation    Number of Visits Requested:   1   Follow-up plan:   Return in about 6 days (around 02/16/2021) for Left GN RFA (IV Versed in  clinic).     Left knee genicular nerve block 10/13/20, bilateral GNB 01/17/21    Recent Visits Date Type Provider Dept  01/17/21 Procedure visit [name], MD [facility]  01/10/21 Office Visit [name], MD [facility]  11/17/20 Office Visit [name], MD [facility]  Showing recent visits within past 90 days and meeting all other requirements Today's Visits Date Type Provider Dept  02/10/21 Office Visit [name], MD [facility]  Showing today's visits and meeting all other requirements Future Appointments No visits were found meeting these conditions. Showing future appointments within next 90 days and meeting all other requirements  I discussed the assessment and treatment plan with the patient. The patient was provided an opportunity to ask questions and all were answered. The patient agreed with the plan and demonstrated an understanding of the instructions.  Patient advised to call back or seek an in-person evaluation if the symptoms or condition worsens.  Duration of encounter: 28mnutes.  Note by: BGillis Santa MD Date: 02/10/2021; Time: 10:53 AM

## 2021-02-11 NOTE — Patient Instructions (Signed)
______________________________________________________________________  Preparing for Procedure with Sedation  NOTICE: Due to recent regulatory changes, starting on September 06, 2020, procedures requiring intravenous (IV) sedation will no longer be performed at the [facility].  These types of procedures are required to be performed at [facility] ambulatory [facility] facility.  We are very sorry for the inconvenience.  Procedure appointments are limited to planned procedures: No Prescription Refills. No disability issues will be discussed. No medication changes will be discussed.  Instructions: Oral Intake: Do not eat or drink anything for at least 8 hours prior to your procedure. (Exception: Blood Pressure Medication. See below.) Transportation: A driver is required. You may not drive yourself after the procedure. Blood Pressure Medicine: Do not forget to take your blood pressure medicine with a sip of water the morning of the procedure. If your Diastolic (lower reading) is above 100 mmHg, elective cases will be cancelled/rescheduled. Blood thinners: These will need to be stopped for procedures. Notify our staff if you are taking any blood thinners. Depending on which one you take, there will be specific instructions on how and when to stop it. Diabetics on insulin: Notify the staff so that you can be scheduled 1st case in the morning. If your diabetes requires high dose insulin, take only  of your normal insulin dose the morning of the procedure and notify the staff that you have done so. Preventing infections: Shower with an antibacterial soap the morning of your procedure. Build-up your immune system: Take 1000 mg of Vitamin C with every meal (3 times a day) the day prior to your procedure. Antibiotics: Inform the staff if you have a condition or reason that requires you to take antibiotics before dental procedures. Pregnancy: If you are pregnant, call and cancel the procedure. Sickness: If  you have a cold, fever, or any active infections, call and cancel the procedure. Arrival: You must be in the facility at least 30 minutes prior to your scheduled procedure. Children: Do not bring children with you. Dress appropriately: Bring dark clothing that you would not mind if they get stained. Valuables: Do not bring any jewelry or valuables.  Reasons to call and reschedule or cancel your procedure: (Following these recommendations will minimize the risk of a serious complication.) Surgeries: Avoid having procedures within 2 weeks of any surgery. (Avoid for 2 weeks before or after any surgery). Flu Shots: Avoid having procedures within 2 weeks of a flu shots. (Avoid for 2 weeks before or after immunizations). Barium: Avoid having a procedure within 7-10 days after having had a radiological study involving the use of radiological contrast. (Myelograms, Barium swallow or enema study). Heart attacks: Avoid any elective procedures or surgeries for the initial 6 months after a "Myocardial Infarction" (Heart Attack). Blood thinners: It is imperative that you stop these medications before procedures. Let us know if you if you take any blood thinner.  Infection: Avoid procedures during or within two weeks of an infection (including chest colds or gastrointestinal problems). Symptoms associated with infections include: Localized redness, fever, chills, night sweats or profuse sweating, burning sensation when voiding, cough, congestion, stuffiness, runny nose, sore throat, diarrhea, nausea, vomiting, cold or Flu symptoms, recent or current infections. It is specially important if the infection is over the area that we intend to treat. Heart and lung problems: Symptoms that may suggest an active cardiopulmonary problem include: cough, chest pain, breathing difficulties or shortness of breath, dizziness, ankle swelling, uncontrolled high or unusually low blood pressure, and/or palpitations. If you are    experiencing any of these symptoms, cancel your procedure and contact your primary care physician for an evaluation.  Remember:  Regular Business hours are:  Monday to Thursday 8:00 AM to 4:00 PM  Provider's Schedule: Francisco Naveira, MD:  Procedure days: Tuesday and Thursday 7:30 AM to 4:00 PM  Bilal Lateef, MD:  Procedure days: Monday and Wednesday 7:30 AM to 4:00 PM ______________________________________________________________________   

## 2021-02-15 ENCOUNTER — Other Ambulatory Visit: Payer: Self-pay

## 2021-02-15 ENCOUNTER — Emergency Department: Payer: Medicaid Other

## 2021-02-15 ENCOUNTER — Encounter: Payer: Self-pay | Admitting: Emergency Medicine

## 2021-02-15 ENCOUNTER — Emergency Department
Admission: EM | Admit: 2021-02-15 | Discharge: 2021-02-15 | Disposition: A | Discharge status: Home / Self Care | Payer: Medicaid Other | Attending: Emergency Medicine | Admitting: Emergency Medicine

## 2021-02-15 DIAGNOSIS — Y92410 Unspecified street and highway as the place of occurrence of the external cause: Secondary | ICD-10-CM | POA: Diagnosis not present

## 2021-02-15 DIAGNOSIS — M25562 Pain in left knee: Secondary | ICD-10-CM | POA: Insufficient documentation

## 2021-02-15 MED ORDER — METHOCARBAMOL 500 MG PO TABS: 500.0000 mg | ORAL_TABLET | Freq: Three times a day (TID) | ORAL | 0 refills | Status: AC | PRN

## 2021-02-15 MED ORDER — MELOXICAM 15 MG PO TABS: 15.0000 mg | ORAL_TABLET | Freq: Every day | ORAL | 2 refills | Status: DC

## 2021-02-15 NOTE — Discharge Instructions (Addendum)
Take Meloxicam and Robaxin as directed.  
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
## 2021-02-21 ENCOUNTER — Ambulatory Visit: Payer: Self-pay

## 2021-02-21 ENCOUNTER — Encounter: Payer: Self-pay | Admitting: Orthopedic Surgery

## 2021-02-21 ENCOUNTER — Other Ambulatory Visit: Payer: Self-pay

## 2021-02-21 ENCOUNTER — Ambulatory Visit (INDEPENDENT_AMBULATORY_CARE_PROVIDER_SITE_OTHER): Payer: Medicaid Other | Admitting: Orthopedic Surgery

## 2021-02-21 VITALS — BP 134/82 | HR 62 | Ht 67.0 in | Wt 283.6 lb

## 2021-02-21 DIAGNOSIS — M79642 Pain in left hand: Secondary | ICD-10-CM | POA: Diagnosis not present

## 2021-02-21 DIAGNOSIS — M79641 Pain in right hand: Secondary | ICD-10-CM

## 2021-02-21 MED ORDER — MELOXICAM 7.5 MG PO TABS: 7.5000 mg | ORAL_TABLET | Freq: Every day | ORAL | 0 refills | Status: AC
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
## 2021-02-28 ENCOUNTER — Telehealth: Payer: Medicaid Other | Admitting: Student in an Organized Health Care Education/Training Program

## 2021-03-02 ENCOUNTER — Ambulatory Visit
Admission: RE | Admit: 2021-03-02 | Discharge: 2021-03-02 | Disposition: A | Discharge status: Home / Self Care | Payer: Medicaid Other | Source: Ambulatory Visit | Attending: Student in an Organized Health Care Education/Training Program | Admitting: Student in an Organized Health Care Education/Training Program

## 2021-03-02 ENCOUNTER — Telehealth: Payer: Medicaid Other | Admitting: Student in an Organized Health Care Education/Training Program

## 2021-03-02 ENCOUNTER — Other Ambulatory Visit: Payer: Self-pay

## 2021-03-02 ENCOUNTER — Ambulatory Visit (HOSPITAL_BASED_OUTPATIENT_CLINIC_OR_DEPARTMENT_OTHER): Payer: Medicaid Other | Admitting: Student in an Organized Health Care Education/Training Program

## 2021-03-02 ENCOUNTER — Encounter: Payer: Self-pay | Admitting: Student in an Organized Health Care Education/Training Program

## 2021-03-02 DIAGNOSIS — G8929 Other chronic pain: Secondary | ICD-10-CM | POA: Insufficient documentation

## 2021-03-02 DIAGNOSIS — M25562 Pain in left knee: Secondary | ICD-10-CM | POA: Insufficient documentation

## 2021-03-02 DIAGNOSIS — M1712 Unilateral primary osteoarthritis, left knee: Secondary | ICD-10-CM | POA: Insufficient documentation

## 2021-03-02 MED ORDER — MIDAZOLAM HCL 5 MG/5ML IJ SOLN
0.5000 mg | Freq: Once | INTRAMUSCULAR | Status: AC
  Administered 2021-03-02: 10:00:00 1.5 mg via INTRAVENOUS
  Filled 2021-03-02: qty 5

## 2021-03-02 MED ORDER — DEXAMETHASONE SODIUM PHOSPHATE 10 MG/ML IJ SOLN
INTRAMUSCULAR | Status: AC
  Filled 2021-03-02: qty 1

## 2021-03-02 MED ORDER — ROPIVACAINE HCL 2 MG/ML IJ SOLN
INTRAMUSCULAR | Status: AC
  Filled 2021-03-02: qty 20

## 2021-03-02 MED ORDER — LIDOCAINE HCL 2 % IJ SOLN
INTRAMUSCULAR | Status: AC
  Filled 2021-03-02: qty 20

## 2021-03-02 MED ORDER — ROPIVACAINE HCL 2 MG/ML IJ SOLN
9.0000 mL | Freq: Once | INTRAMUSCULAR | Status: AC
  Administered 2021-03-02: 10:00:00 9 mL via PERINEURAL

## 2021-03-02 MED ORDER — LIDOCAINE HCL 2 % IJ SOLN
20.0000 mL | Freq: Once | INTRAMUSCULAR | Status: AC
  Administered 2021-03-02: 10:00:00 400 mg

## 2021-03-02 MED ORDER — DEXAMETHASONE SODIUM PHOSPHATE 10 MG/ML IJ SOLN
10.0000 mg | Freq: Once | INTRAMUSCULAR | Status: AC
  Administered 2021-03-02: 10:00:00 10 mg

## 2021-03-02 NOTE — Progress Notes (Signed)
PROVIDER NOTE: Interpretation of information contained herein should be left to medically-trained personnel. Specific patient instructions are provided elsewhere under "Patient Instructions" section of medical record. This document was created in part using STT-dictation technology, any transcriptional errors that may result from this process are unintentional.  Patient: Anna Barker Type: Established DOB: February 13, 1980 MRN: [id number] PCP: Nira Retort  Service: Procedure DOS: 03/02/2021 Setting: Ambulatory Location: Ambulatory outpatient facility Delivery: Face-to-face Provider: Edward Jolly, MD Specialty: Interventional Pain Management Specialty designation: 09 Location: Outpatient facility Ref. Prov.: Edward Jolly, MD    Primary Reason for Visit: Interventional Pain Management Treatment. CC: Knee Pain    Procedure:          Anesthesia, Analgesia, Anxiolysis:  Type: Therapeutic Superolateral, Superomedial, and Inferomedial, Genicular Nerve Radiofrequency Ablation (destruction).   #1  Region: Lateral, Anterior, and Medial aspects of the knee joint, above and below the knee joint proper. Level: Superior and inferior to the knee joint. Laterality: Left  Anesthesia: Local (1-2% Lidocaine)  Anxiolysis: IV Versed 1.5 mg x 1  Sedation: Minimal  Guidance: Fluoroscopy           Position: Supine   Indications: 1. Chronic pain of left knee   2. Primary osteoarthritis of left knee    Anna Barker has been dealing with the above chronic pain for longer than three months and has either failed to respond, was unable to tolerate, or simply did not get enough benefit from other more conservative therapies including, but not limited to: 1. Over-the-counter medications 2. Anti-inflammatory medications 3. Muscle relaxants 4. Membrane stabilizers 5. Opioids 6. Physical therapy and/or chiropractic manipulation 7. Modalities (Heat, ice, etc.) 8. Invasive techniques such as nerve  blocks. Anna Barker has attained more than 50% relief of the pain from a series of diagnostic injections conducted in separate occasions.  Pain Score: Pre-procedure: 7 /10 Post-procedure: 0-No pain/10    Pre-op H&P Assessment:  Anna Barker is a 41 y.o. (year old), female patient, seen today for interventional treatment. She  has a past surgical history that includes Cholecystectomy. Anna Barker has a current medication list which includes the following prescription(s): buspirone, escitalopram, hydrochlorothiazide, and meloxicam. Her primarily concern today is the Knee Pain  Initial Vital Signs:  Pulse/HCG Rate: 84ECG Heart Rate: 73 Temp: (!) 96.2 F (35.7 C) Resp: 18 BP: (!) 143/93 SpO2: 100 %  BMI: Estimated body mass index is 43.23 kg/m as calculated from the following:   Height as of this encounter: 5\' 7"  (1.702 m).   Weight as of this encounter: 276 lb (125.2 kg).  Risk Assessment: Allergies: Reviewed. She has No Known Allergies.  Allergy Precautions: None required Coagulopathies: Reviewed. None identified.  Blood-thinner therapy: None at this time Active Infection(s): Reviewed. None identified. Anna Barker is afebrile  Site Confirmation: Anna Barker was asked to confirm the procedure and laterality before marking the site Procedure checklist: Completed Consent: Before the procedure and under the influence of no sedative(s), amnesic(s), or anxiolytics, the patient was informed of the treatment options, risks and possible complications. To fulfill our ethical and legal obligations, as recommended by the American Medical Association's Code of Ethics, I have informed the patient of my clinical impression; the nature and purpose of the treatment or procedure; the risks, benefits, and possible complications of the intervention; the alternatives, including doing nothing; the risk(s) and benefit(s) of the alternative treatment(s) or procedure(s); and the risk(s) and benefit(s) of doing  nothing. The patient was provided information about the general risks and possible complications associated  with the procedure. These may include, but are not limited to: failure to achieve desired goals, infection, bleeding, organ or nerve damage, allergic reactions, paralysis, and death. In addition, the patient was informed of those risks and complications associated to the procedure, such as failure to decrease pain; infection; bleeding; organ or nerve damage with subsequent damage to sensory, motor, and/or autonomic systems, resulting in permanent pain, numbness, and/or weakness of one or several areas of the body; allergic reactions; (i.e.: anaphylactic reaction); and/or death. Furthermore, the patient was informed of those risks and complications associated with the medications. These include, but are not limited to: allergic reactions (i.e.: anaphylactic or anaphylactoid reaction(s)); adrenal axis suppression; blood sugar elevation that in diabetics may result in ketoacidosis or comma; water retention that in patients with history of congestive heart failure may result in shortness of breath, pulmonary edema, and decompensation with resultant heart failure; weight gain; swelling or edema; medication-induced neural toxicity; particulate matter embolism and blood vessel occlusion with resultant organ, and/or nervous system infarction; and/or aseptic necrosis of one or more joints. Finally, the patient was informed that Medicine is not an exact science; therefore, there is also the possibility of unforeseen or unpredictable risks and/or possible complications that may result in a catastrophic outcome. The patient indicated having understood very clearly. We have given the patient no guarantees and we have made no promises. Enough time was given to the patient to ask questions, all of which were answered to the patient's satisfaction. Anna Barker has indicated that she wanted to continue with the  procedure. Attestation: I, the ordering provider, attest that I have discussed with the patient the benefits, risks, side-effects, alternatives, likelihood of achieving goals, and potential problems during recovery for the procedure that I have provided informed consent. Date   Time: 03/02/2021  8:58 AM  Pre-Procedure Preparation:  Monitoring: As per clinic protocol. Respiration, ETCO2, SpO2, BP, heart rate and rhythm monitor placed and checked for adequate function Safety Precautions: Patient was assessed for positional comfort and pressure points before starting the procedure. Time-out: I initiated and conducted the "Time-out" before starting the procedure, as per protocol. The patient was asked to participate by confirming the accuracy of the "Time Out" information. Verification of the correct person, site, and procedure were performed and confirmed by me, the nursing staff, and the patient. "Time-out" conducted as per Joint Commission's Universal Protocol (UP.01.01.01). Time: 2956  Description of Procedure:          Target Area: For Genicular Nerve radiofrequency ablation (destruction), the targets are: the superolateral genicular nerve, located in the lateral distal portion of the femoral shaft as it curves to form the lateral epicondyle, in the region of the distal femoral metaphysis; the superomedial genicular nerve, located in the medial distal portion of the femoral shaft as it curves to form the medial epicondyle; and the inferomedial genicular nerve, located in the medial, proximal portion of the tibial shaft, as it curves to form the medial epicondyle, in the region of the proximal tibial metaphysis. Approach: Anterior, ipsilateral approach. Area Prepped: Entire knee area, from mid-thigh to mid-shin, lateral, anterior, and medial aspects. DuraPrep (Iodine Povacrylex [0.7% available iodine] and Isopropyl Alcohol, 74% w/w) Safety Precautions: Aspiration looking for blood return was conducted  prior to all injections. At no point did we inject any substances, as a needle was being advanced. No attempts were made at seeking any paresthesias. Safe injection practices and needle disposal techniques used. Medications properly checked for expiration dates. SDV (single dose  vial) medications used. Description of the Procedure: Protocol guidelines were followed. The patient was placed in position over the procedure table. The target area was identified and the area prepped in the usual manner. The skin and muscle were infiltrated with local anesthetic. Appropriate amount of time allowed to pass for local anesthetics to take effect. Radiofrequency needles were introduced to the target area using fluoroscopic guidance. Using the NeuroTherm NT1100 Radiofrequency Generator, sensory stimulation using 50 Hz was used to locate & identify the nerve, making sure that the needle was positioned such that there was no sensory stimulation below 0.3 V or above 0.7 V. Stimulation using 2 Hz was used to evaluate the motor component. Care was taken not to lesion any nerves that demonstrated motor stimulation of the lower extremities at an output of less than 2.5 times that of the sensory threshold, or a maximum of 2.0 V. Once satisfactory placement of the needles was achieved, the numbing solution was slowly injected after negative aspiration. After waiting for at least 2 minutes, the ablation was performed at 80 degrees C for 60 seconds, using regular Radiofrequency settings. Once the procedure was completed, the needles were then removed and the area cleansed, making sure to leave some of the prepping solution back to take advantage of its long term bactericidal properties. Intra-operative Compliance: Compliant      Vitals:   03/02/21 0951 03/02/21 0954 03/02/21 1000 03/02/21 1013  BP: (!) 132/99 (!) 140/95 (!) 145/100 (!) 127/96  Pulse:      Resp: 17 17 17 17   Temp:      TempSrc:      SpO2: 100% 100% 100% 100%   Weight:      Height:        Start Time: 0936 hrs. End Time: 0954 hrs. Materials & Medications:  Needle(s) Type: Teflon-coated, curved tip, Radiofrequency needle(s) Gauge: 22G Length: 10cm Medication(s): Please see orders for medications and dosing details.  Imaging Guidance (Non-Spinal):          Type of Imaging Technique: Fluoroscopy Guidance (Non-Spinal) Indication(s): Assistance in needle guidance and placement for procedures requiring needle placement in or near specific anatomical locations not easily accessible without such assistance. Exposure Time: Please see nurses notes. Contrast: Before injecting any contrast, we confirmed that the patient did not have an allergy to iodine, shellfish, or radiological contrast. Once satisfactory needle placement was completed at the desired level, radiological contrast was injected. Contrast injected under live fluoroscopy. No contrast complications. See chart for type and volume of contrast used. Fluoroscopic Guidance: I was personally present during the use of fluoroscopy. "Tunnel Vision Technique" used to obtain the best possible view of the target area. Parallax error corrected before commencing the procedure. "Direction-depth-direction" technique used to introduce the needle under continuous pulsed fluoroscopy. Once target was reached, antero-posterior, oblique, and lateral fluoroscopic projection used confirm needle placement in all planes. Images permanently stored in EMR. Interpretation: I personally interpreted the imaging intraoperatively. Adequate needle placement confirmed in multiple planes. Appropriate spread of contrast into desired area was observed. No evidence of afferent or efferent intravascular uptake. Permanent images saved into the patient's record.  Antibiotic Prophylaxis:   Anti-infectives (From admission, onward)    None      Indication(s): None identified  Post-operative Assessment:  Post-procedure Vital Signs:   Pulse/HCG Rate: 8473 Temp:  (!) 96.2 F (35.7 C) Resp: 17 BP:  (!) 127/96 SpO2: 100 %  EBL: None  Complications: No immediate post-treatment complications observed by team, or reported  by patient.  Note: The patient tolerated the entire procedure well. A repeat set of vitals were taken after the procedure and the patient was kept under observation following institutional policy, for this type of procedure. Post-procedural neurological assessment was performed, showing return to baseline, prior to discharge. The patient was provided with post-procedure discharge instructions, including a section on how to identify potential problems. Should any problems arise concerning this procedure, the patient was given instructions to immediately contact us, at any time, without hesitation. In any case, we plan to contact the patient by telephone for a follow-up status report regarding this interventional procedure.  Comments:  No additional relevant information.  Plan of Care  Orders:  Orders Placed This Encounter  Procedures   DG PAIN CLINIC C-ARM 1-60 MIN NO REPORT    Intraoperative interpretation by procedural physician at [facility].    Standing Status:   Standing    Number of Occurrences:   1    Order Specific Question:   Reason for exam:    Answer:   Assistance in needle guidance and placement for procedures requiring needle placement in or near specific anatomical locations not easily accessible without such assistance.    Medications ordered for procedure: Meds ordered this encounter  Medications   lidocaine (XYLOCAINE) 2 % (with pres) injection 400 mg   midazolam (VERSED) 5 MG/5ML injection 0.5-2 mg    Make sure Flumazenil is available in the pyxis when using this medication. If oversedation occurs, administer 0.2 mg IV over 15 sec. If after 45 sec no response, administer 0.2 mg again over 1 min; may repeat at 1 min intervals; not to exceed 4 doses (1 mg)   dexamethasone  (DECADRON) injection 10 mg   ropivacaine (PF) 2 mg/mL (0.2%) (NAROPIN) injection 9 mL   Medications administered: We administered lidocaine, midazolam, dexamethasone, and ropivacaine (PF) 2 mg/mL (0.2%).  See the medical record for exact dosing, route, and time of administration.  Follow-up plan:   Return in about 4 weeks (around 03/30/2021) for Post Procedure Evaluation, virtual.       Left knee genicular nerve block 10/13/20, bilateral GNB 01/17/21     Recent Visits Date Type Provider Dept  02/10/21 Office Visit Edward JollyLateef, Chelse Matas, MD [facility]  01/17/21 Procedure visit Edward JollyLateef, Shonika Kolasinski, MD [facility]  01/10/21 Office Visit Edward JollyLateef, Decarla Siemen, MD [facility]  Showing recent visits within past 90 days and meeting all other requirements Today's Visits Date Type Provider Dept  03/02/21 Procedure visit Edward JollyLateef, Nikkole Placzek, MD [facility]  Showing today's visits and meeting all other requirements Future Appointments Date Type Provider Dept  03/30/21 Appointment Edward JollyLateef, Andreka Stucki, MD [facility]  Showing future appointments within next 90 days and meeting all other requirements  Disposition: Discharge home  Discharge (Date   Time): 03/02/2021; 1013 hrs.   Primary Care Physician: [facility] Location: [facility] Note by: Edward JollyBilal Isyss Espinal, MD Date: 03/02/2021; Time: 10:27 AM  Disclaimer:  Medicine is not an exact science. The only guarantee in medicine is that nothing is guaranteed. It is important to note that the decision to proceed with this intervention was based on the information collected from the patient. The Data and conclusions were drawn from the patient's questionnaire, the interview, and the physical examination. Because the information was provided in large part by the patient, it cannot be guaranteed that it has not been purposely or unconsciously manipulated. Every effort has been made to obtain as much  relevant data as possible for this evaluation. It is important to note that the conclusions that lead to this procedure are derived in large part from the available data. Always take into account that the treatment will also be dependent on availability of resources and existing treatment guidelines, considered by other Pain Management Practitioners as being common knowledge and practice, at the time of the intervention. For Medico-Legal purposes, it is also important to point out that variation in procedural techniques and pharmacological choices are the acceptable norm. The indications, contraindications, technique, and results of the above procedure should only be interpreted and judged by a Board-Certified Interventional Pain Specialist with extensive familiarity and expertise in the same exact procedure and technique.

## 2021-03-02 NOTE — Patient Instructions (Signed)

## 2021-03-03 ENCOUNTER — Telehealth: Payer: Self-pay | Admitting: *Deleted

## 2021-03-03 NOTE — Telephone Encounter (Signed)
Post procedure call; patient reports that she is doing well, is experiencing some relief and only a small amount of tightness.

## 2021-03-30 ENCOUNTER — Encounter: Payer: Self-pay | Admitting: Student in an Organized Health Care Education/Training Program

## 2021-03-30 ENCOUNTER — Ambulatory Visit
Payer: Medicaid Other | Attending: Student in an Organized Health Care Education/Training Program | Admitting: Student in an Organized Health Care Education/Training Program

## 2021-03-30 ENCOUNTER — Other Ambulatory Visit: Payer: Self-pay

## 2021-03-30 DIAGNOSIS — M1712 Unilateral primary osteoarthritis, left knee: Secondary | ICD-10-CM

## 2021-03-30 DIAGNOSIS — G8929 Other chronic pain: Secondary | ICD-10-CM

## 2021-03-30 DIAGNOSIS — M25562 Pain in left knee: Secondary | ICD-10-CM

## 2021-03-30 NOTE — Progress Notes (Signed)
Patient: Anna Barker  Service Category: E/M  Provider: [name], MD  DOB: 1980/03/19  DOS: 03/30/2021  Location: Office  MRN: [id number]  Setting: Ambulatory outpatient  Referring Provider: Ellene Route  Type: Established Patient  Specialty: Interventional Pain Management  PCP: Ellene Route  Location: Remote location  Delivery: TeleHealth     Virtual Encounter - Pain Management PROVIDER NOTE: Information contained herein reflects review and annotations entered in association with encounter. Interpretation of such information and data should be left to medically-trained personnel. Information provided to patient can be located elsewhere in the medical record under "Patient Instructions". Document created using STT-dictation technology, any transcriptional errors that may result from process are unintentional.    Contact & Pharmacy Preferred: [phone] Home: [phone] (home) Mobile: [phone] (mobile) E-mail: [email or web address]  [place] ([place]), [place] [address] Phone: [phone] Fax: [phone]   Pre-screening  Anna Barker offered "in-person" vs "virtual" encounter. She indicated preferring virtual for this encounter.   Reason COVID-19*   Social distancing based on CDC and AMA recommendations.   I contacted Anna Barker on 03/30/2021 via telephone.      I clearly identified myself as [name], MD. I verified that I was speaking with the correct person using two identifiers (Name: Anna Barker, and date of birth: 17-Nov-1980).  Consent I sought verbal advanced consent from Anna Barker for virtual visit interactions. I informed Anna Barker of possible security and privacy concerns, risks, and limitations associated with providing "not-in-person" medical evaluation and management services. I also informed Anna Barker of the availability of "in-person"  appointments. Finally, I informed her that there would be a charge for the virtual visit and that she could be  personally, fully or partially, financially responsible for it. Anna Barker expressed understanding and agreed to proceed.   Historic Elements   Anna Barker is a 41 y.o. year old, female patient evaluated today after our last contact on 03/02/2021. Anna Barker  has a past medical history of Arthritis and Hypertension. She also  has a past surgical history that includes Cholecystectomy. Anna Barker has a current medication list which includes the following prescription(s): buspirone, escitalopram, and hydrochlorothiazide. She  reports that she has been smoking cigarettes. She has never used smokeless tobacco. She reports current alcohol use of about 3.0 standard drinks per week. She reports current drug use. Frequency: 7.00 times per week. Drug: Marijuana. Anna Barker has No Known Allergies.   HPI  Today, she is being contacted for a post-procedure assessment.   Post-procedure evaluation     Procedure:          Anesthesia, Analgesia, Anxiolysis:  Type: Therapeutic Superolateral, Superomedial, and Inferomedial, Genicular Nerve Radiofrequency Ablation (destruction).   #1  Region: Lateral, Anterior, and Medial aspects of the knee joint, above and below the knee joint proper. Level: Superior and inferior to the knee joint. Laterality: Left  Anesthesia: Local (1-2% Lidocaine)  Anxiolysis: IV Versed 1.5 mg x 1  Sedation: Minimal  Guidance: Fluoroscopy           Position: Supine   Indications: 1. Chronic pain of left knee   2. Primary osteoarthritis of left knee    Anna Barker has been dealing with the above chronic pain for longer than three months and has either failed to respond, was unable to tolerate, or simply did not get enough benefit from other more conservative therapies including, but not limited to: 1. Over-the-counter  medications 2. Anti-inflammatory medications 3.  Muscle relaxants 4. Membrane stabilizers 5. Opioids 6. Physical therapy and/or chiropractic manipulation 7. Modalities (Heat, ice, etc.) 8. Invasive techniques such as nerve blocks. Anna Barker has attained more than 50% relief of the pain from a series of diagnostic injections conducted in separate occasions.  Pain Score: Pre-procedure: 7 /10 Post-procedure: 0-No pain/10     Effectiveness:  Initial hour after procedure: 100 %  Subsequent 4-6 hours post-procedure: 100 %  Analgesia past initial 6 hours: 50 % (ongoing)  Ongoing improvement:  Analgesic:  50% Function: Anna Barker reports improvement in function ROM: Somewhat improved   Laboratory Chemistry Profile   Renal Lab Results  Component Value Date   BUN 9 04/18/2016   CREATININE 0.64 04/18/2016   GFRAA >60 04/18/2016   GFRNONAA >60 04/18/2016    Hepatic Lab Results  Component Value Date   AST 21 04/18/2016   ALT 15 04/18/2016   ALBUMIN 4.0 04/18/2016   ALKPHOS 54 04/18/2016    Electrolytes Lab Results  Component Value Date   NA 138 04/18/2016   K 3.3 (L) 04/18/2016   CL 106 04/18/2016   CALCIUM 9.0 04/18/2016    Bone No results found for: VD25OH, VD125OH2TOT, CL2751ZG0, FV4944HQ7, 25OHVITD1, 25OHVITD2, 25OHVITD3, TESTOFREE, TESTOSTERONE  Inflammation (CRP: Acute Phase) (ESR: Chronic Phase) No results found for: CRP, ESRSEDRATE, LATICACIDVEN       Note: Above Lab results reviewed.  Assessment  The primary encounter diagnosis was Chronic pain of left knee. A diagnosis of Primary osteoarthritis of left knee was also pertinent to this visit.  Plan of Care   Overall, patient has responded favorably to her left knee genicular RFA.  She endorses approximately 50% pain relief that is ongoing.  She does have posterior knee pain which she feels like is more musculoskeletal in nature.  I have encouraged her to apply Voltaren gel to the back of her knee and to utilize NSAIDs as needed.  We will monitor her  symptoms and should her knee pain not improve or should it worsen, she is instructed to contact us.  Patient endorsed understanding.    Follow-up plan:   Return if symptoms worsen or fail to improve.     Left knee genicular nerve block 10/13/20, bilateral GNB 01/17/21      Recent Visits Date Type Provider Dept  03/02/21 Procedure visit [name], MD [facility]  02/10/21 Office Visit [name], MD [facility]  01/17/21 Procedure visit [name], MD [facility]  01/10/21 Office Visit [name], MD [facility]  Showing recent visits within past 90 days and meeting all other requirements Today's Visits Date Type Provider Dept  03/30/21 Office Visit [name], MD [facility]  Showing today's visits and meeting all other requirements Future Appointments No visits were found meeting these conditions. Showing future appointments within next 90 days and meeting all other requirements  I discussed the assessment and treatment plan with the patient. The patient was provided an opportunity to ask questions and all were answered. The patient agreed with the plan and demonstrated an understanding of the instructions.  Patient advised to call back or seek an in-person evaluation if the symptoms or condition worsens.  Duration of encounter: 69mnutes.  Note by: BGillis Santa MD Date: 03/30/2021; Time: 3:06 PM

## 2021-04-04 ENCOUNTER — Other Ambulatory Visit: Payer: Self-pay

## 2021-04-04 ENCOUNTER — Encounter: Payer: Self-pay | Admitting: Orthopedic Surgery

## 2021-04-04 ENCOUNTER — Ambulatory Visit (INDEPENDENT_AMBULATORY_CARE_PROVIDER_SITE_OTHER): Payer: Medicaid Other | Admitting: Orthopedic Surgery

## 2021-04-04 DIAGNOSIS — R200 Anesthesia of skin: Secondary | ICD-10-CM | POA: Diagnosis not present
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
## 2021-04-07 ENCOUNTER — Other Ambulatory Visit: Payer: Self-pay

## 2021-04-07 ENCOUNTER — Ambulatory Visit
Payer: Medicaid Other | Attending: Student in an Organized Health Care Education/Training Program | Admitting: Student in an Organized Health Care Education/Training Program

## 2021-04-07 ENCOUNTER — Encounter: Payer: Self-pay | Admitting: Student in an Organized Health Care Education/Training Program

## 2021-04-07 VITALS — BP 136/105 | HR 88 | Temp 97.4°F | Ht 67.0 in | Wt 276.0 lb

## 2021-04-07 DIAGNOSIS — G8929 Other chronic pain: Secondary | ICD-10-CM | POA: Insufficient documentation

## 2021-04-07 DIAGNOSIS — M25562 Pain in left knee: Secondary | ICD-10-CM | POA: Insufficient documentation

## 2021-04-07 DIAGNOSIS — M1712 Unilateral primary osteoarthritis, left knee: Secondary | ICD-10-CM | POA: Insufficient documentation

## 2021-04-07 DIAGNOSIS — G5603 Carpal tunnel syndrome, bilateral upper limbs: Secondary | ICD-10-CM | POA: Insufficient documentation

## 2021-04-07 MED ORDER — KETOROLAC TROMETHAMINE 30 MG/ML IJ SOLN
30.0000 mg | Freq: Once | INTRAMUSCULAR | Status: AC
  Administered 2021-04-07: 30 mg via INTRAMUSCULAR

## 2021-04-07 MED ORDER — KETOROLAC TROMETHAMINE 30 MG/ML IJ SOLN
INTRAMUSCULAR | Status: AC
  Filled 2021-04-07: qty 1

## 2021-04-07 MED ORDER — ORPHENADRINE CITRATE 30 MG/ML IJ SOLN
30.0000 mg | Freq: Once | INTRAMUSCULAR | Status: AC
  Administered 2021-04-07: 30 mg via INTRAMUSCULAR
  Filled 2021-04-07: qty 2

## 2021-04-07 NOTE — Progress Notes (Signed)
Safety precautions to be maintained throughout the outpatient stay will include: orient to surroundings, keep bed in low position, maintain call bell within reach at all times, provide assistance with transfer out of bed and ambulation.  
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
## 2021-04-12 ENCOUNTER — Ambulatory Visit (INDEPENDENT_AMBULATORY_CARE_PROVIDER_SITE_OTHER): Payer: Medicaid Other | Admitting: Physical Medicine and Rehabilitation

## 2021-04-12 ENCOUNTER — Other Ambulatory Visit: Payer: Self-pay

## 2021-04-12 DIAGNOSIS — R202 Paresthesia of skin: Secondary | ICD-10-CM

## 2021-04-12 NOTE — Progress Notes (Signed)
Numeric Pain Rating Scale and Functional Assessment ?Average Pain 6 ? ?Right hand dominant. ?Numbness , tingling in both hands.  ?Hurts to grip objects. ?Pain is constant all day with activities. ?Nothing helps.  ?Mainly the first 2 digits on both hands. ?Been going on over a year. ?In the last MONTH (on 0-10 scale) has pain interfered with the following? ? ?1. General activity like being  able to carry out your everyday physical activities such as walking, climbing stairs, carrying groceries, or moving a chair?  ?Rating(6) ? ? ?+Driver, -BT, -Dye Allergies.  ?

## 2021-04-12 NOTE — Progress Notes (Signed)
Anna Barker - 41 y.o. female MRN [id number]  Date of birth: 09-12-1980  Office Visit Note: Visit Date: 04/12/2021 PCP: Nira Retort Referred by: Marlyne Beards, MD  Subjective: Chief Complaint  Patient presents with   Right Hand - Numbness   Left Hand - Numbness   HPI:  Anna Barker is a 41 y.o. female who comes in today at the request of Dr. Marlyne Beards for electrodiagnostic study of the Bilateral upper extremities.  Patient is Right hand dominant. bilateral hand numbness and paresthesias.  This occurs in all of her fingers.  It happens mostly at night and wakes her up 7 nights per week.  She shakes her hand for symptom relief.  This been going on for years.  Her symptoms are worsening.  She is tried extension splints which have not provided any symptom relief.  She also has pain at the base of her thumb at the [facility] joint, but this is not a big issue for her as the numbness and paresthesias  ROS Otherwise per HPI.  Assessment & Plan: Visit Diagnoses:    ICD-10-CM   1. Paresthesia of skin  R20.2 NCV with EMG (electromyography)      Plan: Impression: The above electrodiagnostic study is ABNORMAL and reveals evidence of a mild bilateral median nerve entrapment at the wrist (carpal tunnel syndrome) affecting sensory and motor components.   There is no significant electrodiagnostic evidence of any other focal nerve entrapment, brachial plexopathy or cervical radiculopathy. **This electrodiagnostic study cannot rule out small fiber polyneuropathy and dysesthesias from central pain syndromes such as stroke or central pain sensitization syndromes such as fibromyalgia.  Myotomal referral pain from trigger points is also not excluded.  Recommendations: 1.  Follow-up with referring physician. 2.  Continue current management of symptoms. 3.  Continue use of resting splint at night-time and as needed during the day.  Meds & Orders: No orders of the defined types were  placed in this encounter.   Orders Placed This Encounter  Procedures   NCV with EMG (electromyography)    Follow-up: Return in about 2 weeks (around 04/26/2021) for Marlyne Beards, MD.   Procedures: No procedures performed  EMG & NCV Findings: Evaluation of the left median (across palm) sensory and the right median (across palm) sensory nerves showed prolonged distal peak latency (Wrist, L4.8, R4.3 ms) and prolonged distal peak latency (Palm, L2.3, R2.2 ms).  The left ulnar sensory nerve showed reduced amplitude (14.0 V).  All remaining nerves (as indicated in the following tables) were within normal limits.  Left vs. Right side comparison data for the ulnar sensory nerve indicates abnormal L-R amplitude difference (83.9 %).  All remaining left vs. right side differences were within normal limits.    All examined muscles (as indicated in the following table) showed no evidence of electrical instability.    Impression: The above electrodiagnostic study is ABNORMAL and reveals evidence of a mild bilateral median nerve entrapment at the wrist (carpal tunnel syndrome) affecting sensory and motor components.   There is no significant electrodiagnostic evidence of any other focal nerve entrapment, brachial plexopathy or cervical radiculopathy. **This electrodiagnostic study cannot rule out small fiber polyneuropathy and dysesthesias from central pain syndromes such as stroke or central pain sensitization syndromes such as fibromyalgia.  Myotomal referral pain from trigger points is also not excluded.  Recommendations: 1.  Follow-up with referring physician. 2.  Continue current management of symptoms. 3.  Continue use of resting splint at night-time and as needed during  the day.  ___________________________ Elease Hashimoto Board Certified, American Board of Physical Medicine and Rehabilitation    Nerve Conduction Studies Anti Sensory Summary Table   Stim Site NR Peak (ms) Norm Peak  (ms) P-T Amp (V) Norm P-T Amp Site1 Site2 Delta-P (ms) Dist (cm) Vel (m/s) Norm Vel (m/s)  Left Median Acr Palm Anti Sensory (2nd Digit)  31.2C  Wrist    *4.8 <3.6 19.5 >10 Wrist Palm 2.5 0.0    Palm    *2.3 <2.0 19.9         Right Median Acr Palm Anti Sensory (2nd Digit)  31C  Wrist    *4.3 <3.6 30.3 >10 Wrist Palm 2.1 0.0    Palm    *2.2 <2.0 25.9         Left Radial Anti Sensory (Base 1st Digit)  31.6C  Wrist    2.2 <3.1 38.1  Wrist Base 1st Digit 2.2 0.0    Right Radial Anti Sensory (Base 1st Digit)  30.9C  Wrist    2.3 <3.1 19.2  Wrist Base 1st Digit 2.3 0.0    Left Ulnar Anti Sensory (5th Digit)  31.8C  Wrist    3.7 <3.7 *14.0 >15.0 Wrist 5th Digit 3.7 14.0 38 >38  Right Ulnar Anti Sensory (5th Digit)  31.1C  Wrist    3.5 <3.7 86.8 >15.0 Wrist 5th Digit 3.5 14.0 40 >38   Motor Summary Table   Stim Site NR Onset (ms) Norm Onset (ms) O-P Amp (mV) Norm O-P Amp Site1 Site2 Delta-0 (ms) Dist (cm) Vel (m/s) Norm Vel (m/s)  Left Median Motor (Abd Poll Brev)  31.7C  Wrist    4.1 <4.2 10.6 >5 Elbow Wrist 3.9 22.0 56 >50  Elbow    8.0  10.1         Right Median Motor (Abd Poll Brev)  30.9C  Wrist    4.1 <4.2 5.5 >5 Elbow Wrist 4.2 22.0 52 >50  Elbow    8.3  5.2         Left Ulnar Motor (Abd Dig Min)  31.7C  Wrist    3.0 <4.2 8.7 >3 B Elbow Wrist 3.3 20.5 62 >53  B Elbow    6.3  9.1  A Elbow B Elbow 1.2 10.0 83 >53  A Elbow    7.5  8.7         Right Ulnar Motor (Abd Dig Min)  31C  Wrist    2.9 <4.2 11.6 >3 B Elbow Wrist 3.6 22.0 61 >53  B Elbow    6.5  12.4  A Elbow B Elbow 1.1 10.0 91 >53  A Elbow    7.6  12.7          EMG   Side Muscle Nerve Root Ins Act Fibs Psw Amp Dur Poly Recrt Int Dennie Bible Comment  Left Abd Poll Brev Median C8-T1 Nml Nml Nml Nml Nml 0 Nml Nml   Left 1stDorInt Ulnar C8-T1 Nml Nml Nml Nml Nml 0 Nml Nml   Left PronatorTeres Median C6-7 Nml Nml Nml Nml Nml 0 Nml Nml   Left Biceps Musculocut C5-6 Nml Nml Nml Nml Nml 0 Nml Nml   Left Deltoid Axillary C5-6  Nml Nml Nml Nml Nml 0 Nml Nml     Nerve Conduction Studies Anti Sensory Left/Right Comparison   Stim Site L Lat (ms) R Lat (ms) L-R Lat (ms) L Amp (V) R Amp (V) L-R Amp (%) Site1 Site2 L Vel (m/s) R Vel (  m/s) L-R Vel (m/s)  Median Acr Palm Anti Sensory (2nd Digit)  31.2C  Wrist *4.8 *4.3 0.5 19.5 30.3 35.6 Wrist Palm     Palm *2.3 *2.2 0.1 19.9 25.9 23.2       Radial Anti Sensory (Base 1st Digit)  31.6C  Wrist 2.2 2.3 0.1 38.1 19.2 49.6 Wrist Base 1st Digit     Ulnar Anti Sensory (5th Digit)  31.8C  Wrist 3.7 3.5 0.2 *14.0 86.8 *83.9 Wrist 5th Digit 38 40 2   Motor Left/Right Comparison   Stim Site L Lat (ms) R Lat (ms) L-R Lat (ms) L Amp (mV) R Amp (mV) L-R Amp (%) Site1 Site2 L Vel (m/s) R Vel (m/s) L-R Vel (m/s)  Median Motor (Abd Poll Brev)  31.7C  Wrist 4.1 4.1 0.0 10.6 5.5 48.1 Elbow Wrist 56 52 4  Elbow 8.0 8.3 0.3 10.1 5.2 48.5       Ulnar Motor (Abd Dig Min)  31.7C  Wrist 3.0 2.9 0.1 8.7 11.6 25.0 B Elbow Wrist 62 61 1  B Elbow 6.3 6.5 0.2 9.1 12.4 26.6 A Elbow B Elbow 83 91 8  A Elbow 7.5 7.6 0.1 8.7 12.7 31.5          Waveforms:                      Clinical History: No specialty comments available.     Objective:  VS:  HT:     WT:    BMI:      BP:    HR: bpm   TEMP: ( )   RESP:  Physical Exam Musculoskeletal:        General: No swelling, tenderness or deformity.     Comments: Inspection reveals no atrophy of the bilateral APB or FDI or hand intrinsics. There is no swelling, color changes, allodynia or dystrophic changes. There is 5 out of 5 strength in the bilateral wrist extension, finger abduction and long finger flexion. There is intact sensation to light touch in all dermatomal and peripheral nerve distributions. There is a negative Phalen's test bilaterally. There is a negative Hoffmann's test bilaterally.  Skin:    General: Skin is warm and dry.     Findings: No erythema or rash.  Neurological:     General: No focal deficit present.      Mental Status: She is alert and oriented to person, place, and time.     Motor: No weakness or abnormal muscle tone.     Coordination: Coordination normal.  Psychiatric:        Mood and Affect: Mood normal.        Behavior: Behavior normal.     Imaging: No results found.

## 2021-04-13 NOTE — Procedures (Signed)
EMG & NCV Findings: ?Evaluation of the left median (across palm) sensory and the right median (across palm) sensory nerves showed prolonged distal peak latency (Wrist, L4.8, R4.3 ms) and prolonged distal peak latency (Palm, L2.3, R2.2 ms).  The left ulnar sensory nerve showed reduced amplitude (14.0 ?V).  All remaining nerves (as indicated in the following tables) were within normal limits.  Left vs. Right side comparison data for the ulnar sensory nerve indicates abnormal L-R amplitude difference (83.9 %).  All remaining left vs. right side differences were within normal limits.   ? ?All examined muscles (as indicated in the following table) showed no evidence of electrical instability.   ? ?Impression: ?The above electrodiagnostic study is ABNORMAL and reveals evidence of a mild bilateral median nerve entrapment at the wrist (carpal tunnel syndrome) affecting sensory and motor components.  ? ?There is no significant electrodiagnostic evidence of any other focal nerve entrapment, brachial plexopathy or cervical radiculopathy. **This electrodiagnostic study cannot rule out small fiber polyneuropathy and dysesthesias from central pain syndromes such as stroke or central pain sensitization syndromes such as fibromyalgia.  Myotomal referral pain from trigger points is also not excluded. ? ?Recommendations: ?1.  Follow-up with referring physician. ?2.  Continue current management of symptoms. ?3.  Continue use of resting splint at night-time and as needed during the day. ? ?___________________________ ?Laurence Spates FAAPMR ?Board Certified, Tax adviser of Physical Medicine and Rehabilitation ? ? ? ?Nerve Conduction Studies ?Anti Sensory Summary Table ? ? Stim Site NR Peak (ms) Norm Peak (ms) P-T Amp (?V) Norm P-T Amp Site1 Site2 Delta-P (ms) Dist (cm) Vel (m/s) Norm Vel (m/s)  ?Left Median Acr Palm Anti Sensory (2nd Digit)  31.2?C  ?Wrist    *4.8 <3.6 19.5 >10 Wrist Palm 2.5 0.0    ?Palm    *2.3 <2.0 19.9          ?Right Median Acr Palm Anti Sensory (2nd Digit)  31?C  ?Wrist    *4.3 <3.6 30.3 >10 Wrist Palm 2.1 0.0    ?Palm    *2.2 <2.0 25.9         ?Left Radial Anti Sensory (Base 1st Digit)  31.6?C  ?Wrist    2.2 <3.1 38.1  Wrist Base 1st Digit 2.2 0.0    ?Right Radial Anti Sensory (Base 1st Digit)  30.9?C  ?Wrist    2.3 <3.1 19.2  Wrist Base 1st Digit 2.3 0.0    ?Left Ulnar Anti Sensory (5th Digit)  31.8?C  ?Wrist    3.7 <3.7 *14.0 >15.0 Wrist 5th Digit 3.7 14.0 38 >38  ?Right Ulnar Anti Sensory (5th Digit)  31.1?C  ?Wrist    3.5 <3.7 86.8 >15.0 Wrist 5th Digit 3.5 14.0 40 >38  ? ?Motor Summary Table ? ? Stim Site NR Onset (ms) Norm Onset (ms) O-P Amp (mV) Norm O-P Amp Site1 Site2 Delta-0 (ms) Dist (cm) Vel (m/s) Norm Vel (m/s)  ?Left Median Motor (Abd Poll Brev)  31.7?C  ?Wrist    4.1 <4.2 10.6 >5 Elbow Wrist 3.9 22.0 56 >50  ?Elbow    8.0  10.1         ?Right Median Motor (Abd Poll Brev)  30.9?C  ?Wrist    4.1 <4.2 5.5 >5 Elbow Wrist 4.2 22.0 52 >50  ?Elbow    8.3  5.2         ?Left Ulnar Motor (Abd Dig Min)  31.7?C  ?Wrist    3.0 <4.2 8.7 >3 B Elbow Wrist 3.3 20.5  62 >53  ?B Elbow    6.3  9.1  A Elbow B Elbow 1.2 10.0 83 >53  ?A Elbow    7.5  8.7         ?Right Ulnar Motor (Abd Dig Min)  31?C  ?Wrist    2.9 <4.2 11.6 >3 B Elbow Wrist 3.6 22.0 61 >53  ?B Elbow    6.5  12.4  A Elbow B Elbow 1.1 10.0 91 >53  ?A Elbow    7.6  12.7         ? ?EMG ? ? Side Muscle Nerve Root Ins Act Fibs Psw Amp Dur Poly Recrt Int Fraser Din Comment  ?Left Abd Poll Brev Median C8-T1 Nml Nml Nml Nml Nml 0 Nml Nml   ?Left 1stDorInt Ulnar C8-T1 Nml Nml Nml Nml Nml 0 Nml Nml   ?Left PronatorTeres Median C6-7 Nml Nml Nml Nml Nml 0 Nml Nml   ?Left Biceps Musculocut C5-6 Nml Nml Nml Nml Nml 0 Nml Nml   ?Left Deltoid Axillary C5-6 Nml Nml Nml Nml Nml 0 Nml Nml   ? ? ?Nerve Conduction Studies ?Anti Sensory Left/Right Comparison ? ? Stim Site L Lat (ms) R Lat (ms) L-R Lat (ms) L Amp (?V) R Amp (?V) L-R Amp (%) Site1 Site2 L Vel (m/s) R Vel (m/s) L-R Vel (m/s)   ?Median Acr Palm Anti Sensory (2nd Digit)  31.2?C  ?Wrist *4.8 *4.3 0.5 19.5 30.3 35.6 Wrist Palm     ?Palm *2.3 *2.2 0.1 19.9 25.9 23.2       ?Radial Anti Sensory (Base 1st Digit)  31.6?C  ?Wrist 2.2 2.3 0.1 38.1 19.2 49.6 Wrist Base 1st Digit     ?Ulnar Anti Sensory (5th Digit)  31.8?C  ?Wrist 3.7 3.5 0.2 *14.0 86.8 *83.9 Wrist 5th Digit 38 40 2  ? ?Motor Left/Right Comparison ? ? Stim Site L Lat (ms) R Lat (ms) L-R Lat (ms) L Amp (mV) R Amp (mV) L-R Amp (%) Site1 Site2 L Vel (m/s) R Vel (m/s) L-R Vel (m/s)  ?Median Motor (Abd Poll Brev)  31.7?C  ?Wrist 4.1 4.1 0.0 10.6 5.5 48.1 Elbow Wrist 56 52 4  ?Elbow 8.0 8.3 0.3 10.1 5.2 48.5       ?Ulnar Motor (Abd Dig Min)  31.7?C  ?Wrist 3.0 2.9 0.1 8.7 11.6 25.0 B Elbow Wrist 62 61 1  ?B Elbow 6.3 6.5 0.2 9.1 12.4 26.6 A Elbow B Elbow 83 91 8  ?A Elbow 7.5 7.6 0.1 8.7 12.7 31.5       ? ? ? ?Waveforms: ?    ? ?    ? ?    ? ?  ? ? ?

## 2021-04-19 ENCOUNTER — Ambulatory Visit (INDEPENDENT_AMBULATORY_CARE_PROVIDER_SITE_OTHER): Payer: Medicaid Other | Admitting: Orthopedic Surgery

## 2021-04-19 ENCOUNTER — Other Ambulatory Visit: Payer: Self-pay

## 2021-04-19 DIAGNOSIS — G5603 Carpal tunnel syndrome, bilateral upper limbs: Secondary | ICD-10-CM | POA: Diagnosis not present
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
## 2021-05-03 ENCOUNTER — Ambulatory Visit
Payer: Medicaid Other | Attending: Student in an Organized Health Care Education/Training Program | Admitting: Student in an Organized Health Care Education/Training Program

## 2021-05-03 ENCOUNTER — Encounter: Payer: Self-pay | Admitting: Student in an Organized Health Care Education/Training Program

## 2021-05-03 ENCOUNTER — Other Ambulatory Visit: Payer: Self-pay

## 2021-05-03 DIAGNOSIS — M25561 Pain in right knee: Secondary | ICD-10-CM

## 2021-05-03 DIAGNOSIS — M25562 Pain in left knee: Secondary | ICD-10-CM

## 2021-05-03 DIAGNOSIS — G8929 Other chronic pain: Secondary | ICD-10-CM

## 2021-05-03 DIAGNOSIS — G5603 Carpal tunnel syndrome, bilateral upper limbs: Secondary | ICD-10-CM

## 2021-05-03 DIAGNOSIS — M1712 Unilateral primary osteoarthritis, left knee: Secondary | ICD-10-CM

## 2021-05-03 DIAGNOSIS — M1711 Unilateral primary osteoarthritis, right knee: Secondary | ICD-10-CM

## 2021-05-03 NOTE — Progress Notes (Signed)
Patient: Anna Barker  Service Category: E/M  Provider: [name], MD  ?DOB: 02/22/1980  DOS: 05/03/2021  Location: Office  ?MRN: [id number]  Setting: Ambulatory outpatient  Referring Provider: Ellene Route  ?Type: Established Patient  Specialty: Interventional Pain Management  PCP: Ellene Route  ?Location: Remote location  Delivery: TeleHealth    ? ?Virtual Encounter - Pain Management ?PROVIDER NOTE: Information contained herein reflects review and annotations entered in association with encounter. Interpretation of such information and data should be left to medically-trained personnel. Information provided to patient can be located elsewhere in the medical record under "Patient Instructions". Document created using STT-dictation technology, any transcriptional errors that may result from process are unintentional.  ?  ?Contact & Pharmacy ?Preferred: [phone] ?Home: [phone] (home) ?Mobile: [phone] (mobile) ?E-mail: [email or web address]  ?[place] [facility] [place] [facility] [place] ?Anna Barker ([place]) [place] [address] ?Phone: [phone] Fax: [phone] ?  ?Pre-screening  ?Anna Barker offered "in-person" vs "virtual" encounter. She indicated preferring virtual for this encounter.  ? ?Reason ?COVID-19*  Social distancing based on CDC and AMA recommendations.  ? ?I contacted Anna Barker on 05/03/2021 via telephone.      I clearly identified myself as [name], MD. I verified that I was speaking with the correct person using two identifiers (Name: Anna Barker, and date of birth: Mar 01, 1980). ? ?Consent ?I sought verbal advanced consent from Anna Barker for virtual visit interactions. I informed Anna Barker of possible security and privacy concerns, risks, and limitations associated with providing "not-in-person" medical evaluation and management services. I also informed Anna Barker of the availability of "in-person"  appointments. Finally, I informed her that there would be a charge for the virtual visit and that she could be  personally, fully or partially, financially responsible for it. Anna Barker expressed understanding and agreed to proceed.  ? ?Historic Elements   ?Anna Barker is a 41 y.o. year old, female patient evaluated today after our last contact on 04/07/2021. Anna Barker  has a past medical history of Arthritis and Hypertension. She also  has a past surgical history that includes Cholecystectomy. Anna Barker has a current medication list which includes the following prescription(s): hydrochlorothiazide, meloxicam, methocarbamol, buspirone, and escitalopram. She  reports that she has been smoking cigarettes. She has never used smokeless tobacco. She reports current alcohol use of about 3.0 standard drinks per week. She reports current drug use. Frequency: 7.00 times per week. Drug: Marijuana. Anna Barker has No Known Allergies.  ? ?HPI  ?Today, she is being contacted for follow-up evaluation ? ? ?Since her last clinic visit with me, she was evaluated by orthopedics for her carpal tunnel syndrome.  They reviewed her EMG/nerve conduction velocity study which suggested bilateral median nerve entrapment.  They recommended extension splints as well as corticosteroid injection which she had.  She is finding some relief from it. ? ?I also placed a referral to Dr. Rudene Christians for her left knee pain that has been refractory to intra-articular steroid injections, genicular nerve block and RFA.  She has not heard back from the clinic.  I will have my clinic staff reach out to see what the status of the referral is so we can facilitate her getting into see orthopedics locally. ? ? ?Laboratory Chemistry Profile  ? ?Renal ?Lab Results  ?Component Value Date  ? BUN 9 04/18/2016  ? CREATININE 0.64 04/18/2016  ? GFRAA >60 04/18/2016  ? GFRNONAA >60 04/18/2016  ?  Hepatic ?Lab Results  ?  Component Value Date  ? AST 21 04/18/2016  ? ALT  15 04/18/2016  ? ALBUMIN 4.0 04/18/2016  ? ALKPHOS 54 04/18/2016  ?  ?Electrolytes ?Lab Results  ?Component Value Date  ? NA 138 04/18/2016  ? K 3.3 (L) 04/18/2016  ? CL 106 04/18/2016  ? CALCIUM 9.0 04/18/2016  ?  Bone ?No results found for: [place], [id number], [id number], [id number], 25OHVITD1, 25OHVITD2, 25OHVITD3, TESTOFREE, TESTOSTERONE  ?Inflammation (CRP: Acute Phase) (ESR: Chronic Phase) ?No results found for: CRP, ESRSEDRATE, LATICACIDVEN    ?  ? ?Note: Above Lab results reviewed. ? ?Imaging  ?NCV with EMG (electromyography) ?Magnus Sinning, MD     04/13/2021  5:46 AM ?EMG & NCV Findings: ?Evaluation of the left median (across palm) sensory and the right  ?median (across palm) sensory nerves showed prolonged distal peak  ?latency (Wrist, L4.8, R4.3 ms) and prolonged distal peak latency  ?(Palm, L2.3, R2.2 ms).  The left ulnar sensory nerve showed  ?reduced amplitude (14.0 ?V).  All remaining nerves (as indicated  ?in the following tables) were within normal limits.  Left vs.  ?Right side comparison data for the ulnar sensory nerve indicates  ?abnormal L-R amplitude difference (83.9 %).  All remaining left  ?vs. right side differences were within normal limits.   ? ?All examined muscles (as indicated in the following table) showed  ?no evidence of electrical instability.   ? ?Impression: ?The above electrodiagnostic study is ABNORMAL and reveals  ?evidence of a mild bilateral median nerve entrapment at the wrist  ?(carpal tunnel syndrome) affecting sensory and motor components.  ? ?There is no significant electrodiagnostic evidence of any other  ?focal nerve entrapment, brachial plexopathy or cervical  ?radiculopathy. **This electrodiagnostic study cannot rule out  ?small fiber polyneuropathy and dysesthesias from central pain  ?syndromes such as stroke or central pain sensitization syndromes  ?such as fibromyalgia.  Myotomal referral pain from trigger points  ?is also not excluded. ? ?Recommendations: ?1.   Follow-up with referring physician. ?2.  Continue current management of symptoms. ?3.  Continue use of resting splint at night-time and as needed  ?during the day. ? ?___________________________ ?Laurence Spates FAAPMR ?Board Certified, Tax adviser of Physical Medicine and  ?Rehabilitation ? ?Nerve Conduction Studies ?Anti Sensory Summary Table ? ? Stim Site NR Peak (ms) Norm Peak (ms) P-T Amp (?V) Norm P-T Amp  ?Site1 Site2 Delta-P (ms) Dist (cm) Vel (m/s) Norm Vel (m/s)  ?Left Median Acr Palm Anti Sensory (2nd Digit)  31.2?C  ?Wrist    *4.8 <3.6 19.5 >10 Wrist Palm 2.5 0.0    ?Palm    *2.3 <2.0 19.9         ?Right Median Acr Palm Anti Sensory (2nd Digit)  31?C  ?Wrist    *4.3 <3.6 30.3 >10 Wrist Palm 2.1 0.0    ?Palm    *2.2 <2.0 25.9         ?Left Radial Anti Sensory (Base 1st Digit)  31.6?C  ?Wrist    2.2 <3.1 38.1  Wrist Base 1st Digit 2.2 0.0    ?Right Radial Anti Sensory (Base 1st Digit)  30.9?C  ?Wrist    2.3 <3.1 19.2  Wrist Base 1st Digit 2.3 0.0    ?Left Ulnar Anti Sensory (5th Digit)  31.8?C  ?Wrist    3.7 <3.7 *14.0 >15.0 Wrist 5th Digit 3.7 14.0 38 >38  ?Right Ulnar Anti Sensory (5th Digit)  31.1?C  ?Wrist    3.5 <3.7 86.8 >15.0 Wrist 5th Digit 3.5 14.0 40 >  38  ? ?Motor Summary Table ? ? Stim Site NR Onset (ms) Norm Onset (ms) O-P Amp (mV) Norm O-P  ?Amp Site1 Site2 Delta-0 (ms) Dist (cm) Vel (m/s) Norm Vel (m/s)  ?Left Median Motor (Abd Poll Brev)  31.7?C  ?Wrist    4.1 <4.2 10.6 >5 Elbow Wrist 3.9 22.0 56 >50  ?Elbow    8.0  10.1         ?Right Median Motor (Abd Poll Brev)  30.9?C  ?Wrist    4.1 <4.2 5.5 >5 Elbow Wrist 4.2 22.0 52 >50  ?Elbow    8.3  5.2         ?Left Ulnar Motor (Abd Dig Min)  31.7?C  ?Wrist    3.0 <4.2 8.7 >3 B Elbow Wrist 3.3 20.5 62 >53  ?B Elbow    6.3  9.1  A Elbow B Elbow 1.2 10.0 83 >53  ?A Elbow    7.5  8.7         ?Right Ulnar Motor (Abd Dig Min)  31?C  ?Wrist    2.9 <4.2 11.6 >3 B Elbow Wrist 3.6 22.0 61 >53  ?B Elbow    6.5  12.4  A Elbow B Elbow 1.1 10.0 91 >53  ?A Elbow     7.6  12.7         ? ?EMG ? ? Side Muscle Nerve Root Ins Act Fibs Psw Amp Dur Poly Recrt Int  ?Pat Comment  ?Left Abd Poll Brev Median C8-T1 Nml Nml Nml Nml Nml 0 Nml Nml   ?Left 1stDorInt Ulnar C8-

## 2021-05-04 ENCOUNTER — Telehealth: Payer: Medicaid Other | Admitting: Student in an Organized Health Care Education/Training Program

## 2021-05-16 ENCOUNTER — Other Ambulatory Visit: Payer: Self-pay | Admitting: Orthopedic Surgery

## 2021-05-16 DIAGNOSIS — M1712 Unilateral primary osteoarthritis, left knee: Secondary | ICD-10-CM

## 2021-05-16 DIAGNOSIS — G8929 Other chronic pain: Secondary | ICD-10-CM

## 2021-05-27 ENCOUNTER — Ambulatory Visit
Admission: RE | Admit: 2021-05-27 | Discharge: 2021-05-27 | Disposition: A | Discharge status: Home / Self Care | Payer: Medicaid Other | Source: Ambulatory Visit | Attending: Orthopedic Surgery | Admitting: Orthopedic Surgery

## 2021-05-27 DIAGNOSIS — G8929 Other chronic pain: Secondary | ICD-10-CM | POA: Insufficient documentation

## 2021-05-27 DIAGNOSIS — M25562 Pain in left knee: Secondary | ICD-10-CM | POA: Insufficient documentation

## 2021-05-27 DIAGNOSIS — M1712 Unilateral primary osteoarthritis, left knee: Secondary | ICD-10-CM | POA: Insufficient documentation

## 2021-06-01 ENCOUNTER — Other Ambulatory Visit: Payer: Self-pay | Admitting: Orthopedic Surgery

## 2021-06-10 ENCOUNTER — Ambulatory Visit: Payer: Medicaid Other | Admitting: Orthopedic Surgery

## 2021-06-16 ENCOUNTER — Ambulatory Visit (INDEPENDENT_AMBULATORY_CARE_PROVIDER_SITE_OTHER): Payer: Medicaid Other | Admitting: Orthopedic Surgery

## 2021-06-16 ENCOUNTER — Encounter: Payer: Self-pay | Admitting: Orthopedic Surgery

## 2021-06-16 DIAGNOSIS — M180 Bilateral primary osteoarthritis of first carpometacarpal joints: Secondary | ICD-10-CM

## 2021-06-16 DIAGNOSIS — G5603 Carpal tunnel syndrome, bilateral upper limbs: Secondary | ICD-10-CM | POA: Diagnosis not present

## 2021-06-16 DIAGNOSIS — G5602 Carpal tunnel syndrome, left upper limb: Secondary | ICD-10-CM

## 2021-06-16 DIAGNOSIS — G5601 Carpal tunnel syndrome, right upper limb: Secondary | ICD-10-CM

## 2021-06-16 MED ORDER — LIDOCAINE HCL 1 % IJ SOLN
0.5000 mL | INTRAMUSCULAR | Status: AC | PRN
  Administered 2021-06-16: .5 mL

## 2021-06-16 MED ORDER — BETAMETHASONE SOD PHOS & ACET 6 (3-3) MG/ML IJ SUSP
3.0000 mg | INTRAMUSCULAR | Status: AC | PRN
  Administered 2021-06-16: 3 mg via INTRA_ARTICULAR
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
## 2021-06-20 ENCOUNTER — Ambulatory Visit: Payer: Medicaid Other | Admitting: Orthopedic Surgery

## 2021-07-15 ENCOUNTER — Inpatient Hospital Stay: Admission: RE | Admit: 2021-07-15 | Payer: Medicaid Other | Source: Ambulatory Visit

## 2021-07-19 ENCOUNTER — Encounter
Admission: RE | Admit: 2021-07-19 | Discharge: 2021-07-19 | Disposition: A | Discharge status: Home / Self Care | Payer: Medicaid Other | Source: Ambulatory Visit | Attending: Orthopedic Surgery | Admitting: Orthopedic Surgery

## 2021-07-19 ENCOUNTER — Other Ambulatory Visit: Payer: Self-pay

## 2021-07-19 VITALS — BP 143/101 | HR 72 | Resp 16 | Ht 67.0 in | Wt 287.7 lb

## 2021-07-19 DIAGNOSIS — Z01818 Encounter for other preprocedural examination: Secondary | ICD-10-CM | POA: Diagnosis present

## 2021-07-19 DIAGNOSIS — Z01812 Encounter for preprocedural laboratory examination: Secondary | ICD-10-CM

## 2021-07-19 HISTORY — DX: Anxiety disorder, unspecified: F41.9

## 2021-07-19 HISTORY — DX: Depression, unspecified: F32.A

## 2021-07-19 HISTORY — DX: Anemia, unspecified: D64.9

## 2021-07-19 HISTORY — DX: Gastro-esophageal reflux disease without esophagitis: K21.9

## 2021-07-19 LAB — CBC WITH DIFFERENTIAL/PLATELET
Abs Immature Granulocytes: 0.02 10*3/uL (ref 0.00–0.07)
Basophils Absolute: 0 10*3/uL (ref 0.0–0.1)
Basophils Relative: 0 %
Eosinophils Absolute: 0.1 10*3/uL (ref 0.0–0.5)
Eosinophils Relative: 1 %
HCT: 40.7 % (ref 36.0–46.0)
Hemoglobin: 12.7 g/dL (ref 12.0–15.0)
Immature Granulocytes: 0 %
Lymphocytes Relative: 36 %
Lymphs Abs: 2.4 10*3/uL (ref 0.7–4.0)
MCH: 27.3 pg (ref 26.0–34.0)
MCHC: 31.2 g/dL (ref 30.0–36.0)
MCV: 87.5 fL (ref 80.0–100.0)
Monocytes Absolute: 0.4 10*3/uL (ref 0.1–1.0)
Monocytes Relative: 5 %
Neutro Abs: 3.8 10*3/uL (ref 1.7–7.7)
Neutrophils Relative %: 58 %
Platelets: 334 10*3/uL (ref 150–400)
RBC: 4.65 MIL/uL (ref 3.87–5.11)
RDW: 14.2 % (ref 11.5–15.5)
WBC: 6.7 10*3/uL (ref 4.0–10.5)
nRBC: 0 % (ref 0.0–0.2)

## 2021-07-19 LAB — URINALYSIS, ROUTINE W REFLEX MICROSCOPIC
Bilirubin Urine: NEGATIVE
Glucose, UA: NEGATIVE mg/dL
Hgb urine dipstick: NEGATIVE
Ketones, ur: NEGATIVE mg/dL
Leukocytes,Ua: NEGATIVE
Nitrite: NEGATIVE
Protein, ur: NEGATIVE mg/dL
Specific Gravity, Urine: 1.005 (ref 1.005–1.030)
pH: 7 (ref 5.0–8.0)

## 2021-07-19 LAB — COMPREHENSIVE METABOLIC PANEL
ALT: 11 U/L (ref 0–44)
AST: 12 U/L — ABNORMAL LOW (ref 15–41)
Albumin: 4 g/dL (ref 3.5–5.0)
Alkaline Phosphatase: 58 U/L (ref 38–126)
Anion gap: 6 (ref 5–15)
BUN: 9 mg/dL (ref 6–20)
CO2: 27 mmol/L (ref 22–32)
Calcium: 9.4 mg/dL (ref 8.9–10.3)
Chloride: 105 mmol/L (ref 98–111)
Creatinine, Ser: 0.63 mg/dL (ref 0.44–1.00)
GFR, Estimated: >60 mL/min (ref >60)
Glucose, Bld: 90 mg/dL (ref 70–99)
Potassium: 3.4 mmol/L — ABNORMAL LOW (ref 3.5–5.1)
Sodium: 138 mmol/L (ref 135–145)
Total Bilirubin: 0.4 mg/dL (ref 0.3–1.2)
Total Protein: 7.7 g/dL (ref 6.5–8.1)

## 2021-07-19 LAB — SURGICAL PCR SCREEN
MRSA, PCR: NEGATIVE
Staphylococcus aureus: NEGATIVE

## 2021-07-19 LAB — TYPE AND SCREEN
ABO/RH(D): B POS
Antibody Screen: NEGATIVE

## 2021-07-19 NOTE — Patient Instructions (Addendum)
Your procedure is scheduled on: 07/28/21 - Thursday Report to the Registration Desk on the 1st floor of the [facility]. To find out your arrival time, please call [phone] between 1PM - 3PM on: 07/27/21 - Wednesday If your arrival time is 6:00 am, do not arrive prior to that time as the [facility] entrance doors do not open until 6:00 am.  REMEMBER: Instructions that are not followed completely may result in serious medical risk, up to and including death; or upon the discretion of your surgeon and anesthesiologist your surgery may need to be rescheduled.  Do not eat food after midnight the night before surgery.  No gum chewing, lozengers or hard candies.  You may however, drink CLEAR liquids up to 2 hours before you are scheduled to arrive for your surgery. Do not drink anything within 2 hours of your scheduled arrival time.  Clear liquids include: - water  - apple juice without pulp - gatorade (not RED colors) - black coffee or tea (Do NOT add milk or creamers to the coffee or tea) Do NOT drink anything that is not on this list.  In addition, your doctor has ordered for you to drink the provided  Ensure Pre-Surgery Clear Carbohydrate Drink  Drinking this carbohydrate drink up to two hours before surgery helps to reduce insulin resistance and improve patient outcomes. Please complete drinking 2 hours prior to scheduled arrival time.  TAKE THESE MEDICATIONS THE MORNING OF SURGERY WITH A SIP OF WATER: NONE  Stop taking beginning 07/20/21, Stop Anti-inflammatories (NSAIDS) such as Advil, Aleve, Ibuprofen, Motrin, Naproxen, Naprosyn and Aspirin based products such as Excedrin, Goodys Powder, BC Powder.  Stop ANY OVER THE COUNTER supplements until after surgery.  You may however, continue to take Tylenol if needed for pain up until the day of surgery.  No Alcohol for 24 hours before or after surgery.  No Smoking including e-cigarettes for 24 hours prior to surgery.  No  chewable tobacco products for at least 6 hours prior to surgery.  No nicotine patches on the day of surgery.  Do not use any "recreational" drugs for at least a week prior to your surgery.  Please be advised that the combination of cocaine and anesthesia may have negative outcomes, up to and including death. If you test positive for cocaine, your surgery will be cancelled.  On the morning of surgery brush your teeth with toothpaste and water, you may rinse your mouth with mouthwash if you wish. Do not swallow any toothpaste or mouthwash.  Use CHG Soap or wipes as directed on instruction sheet.  Do not wear jewelry, make-up, hairpins, clips or nail polish.  Do not wear lotions, powders, or perfumes.   Do not shave body from the neck down 48 hours prior to surgery just in case you cut yourself which could leave a site for infection.  Also, freshly shaved skin may become irritated if using the CHG soap.  Contact lenses, hearing aids and dentures may not be worn into surgery.  Do not bring valuables to the hospital. [facility] is not responsible for any missing/lost belongings or valuables.   Notify your doctor if there is any change in your medical condition (cold, fever, infection).  Wear comfortable clothing (specific to your surgery type) to the hospital.  After surgery, you can help prevent lung complications by doing breathing exercises.  Take deep breaths and cough every 1-2 hours. Your doctor may order a device called an Incentive Spirometer to help you  take deep breaths. When coughing or sneezing, hold a pillow firmly against your incision with both hands. This is called "splinting." Doing this helps protect your incision. It also decreases belly discomfort.  If you are being admitted to the hospital overnight, leave your suitcase in the car. After surgery it may be brought to your room.  If you are being discharged the day of surgery, you will not be allowed to drive  home. You will need a responsible adult (18 years or older) to drive you home and stay with you that night.   If you are taking public transportation, you will need to have a responsible adult (18 years or older) with you. Please confirm with your physician that it is acceptable to use public transportation.   Please call the Pre-admissions Testing Dept. at [phone] if you have any questions about these instructions.  Surgery Visitation Policy:  Patients undergoing a surgery or procedure may have two family members or support persons with them as long as the person is not COVID-19 positive or experiencing its symptoms.   Inpatient Visitation:    Visiting hours are 7 a.m. to 8 p.m. Up to four visitors are allowed at one time in a patient room, including children. The visitors may rotate out with other people during the day. One designated support person (adult) may remain overnight.

## 2021-07-27 MED ORDER — CHLORHEXIDINE GLUCONATE 0.12 % MT SOLN: 15.0000 mL | Freq: Once | OROMUCOSAL | Status: AC

## 2021-07-27 MED ORDER — FAMOTIDINE 20 MG PO TABS: 20.0000 mg | ORAL_TABLET | Freq: Once | ORAL | Status: AC

## 2021-07-27 MED ORDER — CEFAZOLIN IN SODIUM CHLORIDE 3-0.9 GM/100ML-% IV SOLN
3.0000 g | INTRAVENOUS | Status: AC
  Administered 2021-07-28: 3 g via INTRAVENOUS
  Filled 2021-07-27: qty 100

## 2021-07-27 MED ORDER — ORAL CARE MOUTH RINSE: 15.0000 mL | Freq: Once | OROMUCOSAL | Status: AC

## 2021-07-27 MED ORDER — LACTATED RINGERS IV SOLN: INTRAVENOUS | Status: DC

## 2021-07-28 ENCOUNTER — Observation Stay: Payer: Medicaid Other

## 2021-07-28 ENCOUNTER — Encounter
Admission: RE | Disposition: A | Discharge status: Home / Self Care | Payer: Self-pay | Source: Home / Self Care | Attending: Orthopedic Surgery

## 2021-07-28 ENCOUNTER — Other Ambulatory Visit: Payer: Self-pay

## 2021-07-28 ENCOUNTER — Observation Stay
Admission: RE | Admit: 2021-07-28 | Discharge: 2021-07-30 | Disposition: A | Discharge status: Home / Self Care | Payer: Medicaid Other | Attending: Orthopedic Surgery | Admitting: Orthopedic Surgery

## 2021-07-28 ENCOUNTER — Ambulatory Visit: Payer: Medicaid Other | Admitting: Urgent Care

## 2021-07-28 ENCOUNTER — Encounter: Payer: Self-pay | Admitting: Orthopedic Surgery

## 2021-07-28 ENCOUNTER — Ambulatory Visit: Payer: Medicaid Other | Admitting: Anesthesiology

## 2021-07-28 DIAGNOSIS — I10 Essential (primary) hypertension: Secondary | ICD-10-CM | POA: Diagnosis not present

## 2021-07-28 DIAGNOSIS — Z96652 Presence of left artificial knee joint: Secondary | ICD-10-CM

## 2021-07-28 DIAGNOSIS — Z01812 Encounter for preprocedural laboratory examination: Secondary | ICD-10-CM

## 2021-07-28 DIAGNOSIS — Z79899 Other long term (current) drug therapy: Secondary | ICD-10-CM | POA: Diagnosis not present

## 2021-07-28 DIAGNOSIS — M1712 Unilateral primary osteoarthritis, left knee: Principal | ICD-10-CM | POA: Insufficient documentation

## 2021-07-28 HISTORY — PX: TOTAL KNEE ARTHROPLASTY: SHX125

## 2021-07-28 LAB — ABO/RH: ABO/RH(D): B POS

## 2021-07-28 LAB — CBC
HCT: 37.4 % (ref 36.0–46.0)
Hemoglobin: 12 g/dL (ref 12.0–15.0)
MCH: 27.5 pg (ref 26.0–34.0)
MCHC: 32.1 g/dL (ref 30.0–36.0)
MCV: 85.6 fL (ref 80.0–100.0)
Platelets: 318 10*3/uL (ref 150–400)
RBC: 4.37 MIL/uL (ref 3.87–5.11)
RDW: 14.2 % (ref 11.5–15.5)
WBC: 10 10*3/uL (ref 4.0–10.5)
nRBC: 0 % (ref 0.0–0.2)

## 2021-07-28 LAB — CREATININE, SERUM
Creatinine, Ser: 0.76 mg/dL (ref 0.44–1.00)
GFR, Estimated: >60 mL/min (ref >60)

## 2021-07-28 LAB — POCT PREGNANCY, URINE: Preg Test, Ur: NEGATIVE

## 2021-07-28 SURGERY — ARTHROPLASTY, KNEE, TOTAL
Anesthesia: General | Site: Knee | Laterality: Left

## 2021-07-28 MED ORDER — ENOXAPARIN SODIUM 30 MG/0.3ML IJ SOSY
30.0000 mg | PREFILLED_SYRINGE | Freq: Two times a day (BID) | INTRAMUSCULAR | Status: DC
  Administered 2021-07-29 – 2021-07-30 (×3): 30 mg via SUBCUTANEOUS
  Filled 2021-07-28 (×3): qty 0.3

## 2021-07-28 MED ORDER — DEXAMETHASONE SODIUM PHOSPHATE 10 MG/ML IJ SOLN
INTRAMUSCULAR | Status: AC
  Filled 2021-07-28: qty 1

## 2021-07-28 MED ORDER — ALUM & MAG HYDROXIDE-SIMETH 200-200-20 MG/5ML PO SUSP
30.0000 mL | ORAL | Status: DC | PRN
  Administered 2021-07-29: 30 mL via ORAL
  Filled 2021-07-28: qty 30

## 2021-07-28 MED ORDER — METOCLOPRAMIDE HCL 5 MG PO TABS
5.0000 mg | ORAL_TABLET | Freq: Three times a day (TID) | ORAL | Status: DC | PRN
  Administered 2021-07-29: 10 mg via ORAL
  Filled 2021-07-28: qty 2

## 2021-07-28 MED ORDER — OXYCODONE HCL 5 MG PO TABS
5.0000 mg | ORAL_TABLET | Freq: Once | ORAL | Status: AC | PRN
  Administered 2021-07-28: 5 mg via ORAL

## 2021-07-28 MED ORDER — PROPOFOL 10 MG/ML IV BOLUS
INTRAVENOUS | Status: AC
  Filled 2021-07-28: qty 20

## 2021-07-28 MED ORDER — ONDANSETRON HCL 4 MG PO TABS: 4.0000 mg | ORAL_TABLET | Freq: Four times a day (QID) | ORAL | 0 refills | Status: DC | PRN

## 2021-07-28 MED ORDER — KETOROLAC TROMETHAMINE 30 MG/ML IJ SOLN
INTRAMUSCULAR | Status: DC | PRN
  Administered 2021-07-28: 30 mg via INTRA_ARTICULAR

## 2021-07-28 MED ORDER — METHOCARBAMOL 500 MG PO TABS
500.0000 mg | ORAL_TABLET | Freq: Four times a day (QID) | ORAL | Status: DC | PRN
  Administered 2021-07-29 (×3): 500 mg via ORAL
  Filled 2021-07-28 (×4): qty 1

## 2021-07-28 MED ORDER — DIPHENHYDRAMINE HCL 12.5 MG/5ML PO ELIX: 12.5000 mg | ORAL_SOLUTION | ORAL | Status: DC | PRN

## 2021-07-28 MED ORDER — ACETAMINOPHEN 325 MG PO TABS: 325.0000 mg | ORAL_TABLET | Freq: Four times a day (QID) | ORAL | Status: DC | PRN

## 2021-07-28 MED ORDER — PANTOPRAZOLE SODIUM 40 MG PO TBEC
40.0000 mg | DELAYED_RELEASE_TABLET | Freq: Every day | ORAL | Status: DC
  Administered 2021-07-28 – 2021-07-30 (×3): 40 mg via ORAL
  Filled 2021-07-28 (×3): qty 1

## 2021-07-28 MED ORDER — SODIUM CHLORIDE FLUSH 0.9 % IV SOLN
INTRAVENOUS | Status: AC
  Filled 2021-07-28: qty 40

## 2021-07-28 MED ORDER — PROPOFOL 500 MG/50ML IV EMUL
INTRAVENOUS | Status: DC | PRN
  Administered 2021-07-28: 150 ug/kg/min via INTRAVENOUS

## 2021-07-28 MED ORDER — PHENYLEPHRINE HCL-NACL 20-0.9 MG/250ML-% IV SOLN
INTRAVENOUS | Status: AC
  Filled 2021-07-28: qty 250

## 2021-07-28 MED ORDER — HYDROCHLOROTHIAZIDE 25 MG PO TABS
25.0000 mg | ORAL_TABLET | Freq: Every day | ORAL | Status: DC
  Administered 2021-07-28 – 2021-07-30 (×3): 25 mg via ORAL
  Filled 2021-07-28 (×3): qty 1

## 2021-07-28 MED ORDER — MIDAZOLAM HCL 2 MG/2ML IJ SOLN
INTRAMUSCULAR | Status: AC
  Filled 2021-07-28: qty 2

## 2021-07-28 MED ORDER — BUPIVACAINE-MELOXICAM ER 400-12 MG/14ML IJ SOLN
INTRAMUSCULAR | Status: DC | PRN
  Administered 2021-07-28: 400 mg

## 2021-07-28 MED ORDER — MENTHOL 3 MG MT LOZG: 1.0000 | LOZENGE | OROMUCOSAL | Status: DC | PRN

## 2021-07-28 MED ORDER — FLEET ENEMA 7-19 GM/118ML RE ENEM: 1.0000 | ENEMA | Freq: Once | RECTAL | Status: DC | PRN

## 2021-07-28 MED ORDER — ONDANSETRON HCL 4 MG PO TABS: 4.0000 mg | ORAL_TABLET | Freq: Four times a day (QID) | ORAL | Status: DC | PRN

## 2021-07-28 MED ORDER — DOCUSATE SODIUM 100 MG PO CAPS
100.0000 mg | ORAL_CAPSULE | Freq: Two times a day (BID) | ORAL | Status: DC
  Administered 2021-07-28 – 2021-07-30 (×5): 100 mg via ORAL
  Filled 2021-07-28 (×5): qty 1

## 2021-07-28 MED ORDER — FENTANYL CITRATE (PF) 100 MCG/2ML IJ SOLN
INTRAMUSCULAR | Status: AC
  Administered 2021-07-28: 25 ug via INTRAVENOUS
  Filled 2021-07-28: qty 2

## 2021-07-28 MED ORDER — CHLORHEXIDINE GLUCONATE 0.12 % MT SOLN
OROMUCOSAL | Status: AC
  Administered 2021-07-28: 15 mL via OROMUCOSAL
  Filled 2021-07-28: qty 15

## 2021-07-28 MED ORDER — BUPIVACAINE-EPINEPHRINE 0.25% -1:200000 IJ SOLN
INTRAMUSCULAR | Status: DC | PRN
  Administered 2021-07-28: 31 mL via INTRAMUSCULAR

## 2021-07-28 MED ORDER — PHENYLEPHRINE HCL-NACL 20-0.9 MG/250ML-% IV SOLN
INTRAVENOUS | Status: DC | PRN
  Administered 2021-07-28: 40 ug/min via INTRAVENOUS

## 2021-07-28 MED ORDER — HYDROMORPHONE HCL 1 MG/ML IJ SOLN
0.5000 mg | INTRAMUSCULAR | Status: DC | PRN
  Administered 2021-07-28 – 2021-07-30 (×4): 1 mg via INTRAVENOUS
  Filled 2021-07-28 (×4): qty 1

## 2021-07-28 MED ORDER — OXYCODONE HCL 5 MG PO TABS
10.0000 mg | ORAL_TABLET | ORAL | Status: DC | PRN
  Administered 2021-07-28: 15 mg via ORAL
  Administered 2021-07-28: 10 mg via ORAL
  Administered 2021-07-29 – 2021-07-30 (×3): 15 mg via ORAL
  Filled 2021-07-28 (×3): qty 3
  Filled 2021-07-28 (×2): qty 2
  Filled 2021-07-28: qty 3
  Filled 2021-07-28: qty 2

## 2021-07-28 MED ORDER — OXYCODONE HCL 5 MG PO TABS
5.0000 mg | ORAL_TABLET | ORAL | Status: DC | PRN
  Administered 2021-07-29: 5 mg via ORAL
  Administered 2021-07-29: 10 mg via ORAL
  Filled 2021-07-28 (×2): qty 1
  Filled 2021-07-28: qty 2

## 2021-07-28 MED ORDER — MORPHINE SULFATE (PF) 10 MG/ML IV SOLN
INTRAVENOUS | Status: AC
  Filled 2021-07-28: qty 1

## 2021-07-28 MED ORDER — METHOCARBAMOL 500 MG PO TABS: 500.0000 mg | ORAL_TABLET | Freq: Four times a day (QID) | ORAL | 0 refills | Status: DC | PRN

## 2021-07-28 MED ORDER — DOCUSATE SODIUM 100 MG PO CAPS: 100.0000 mg | ORAL_CAPSULE | Freq: Two times a day (BID) | ORAL | 0 refills | Status: DC

## 2021-07-28 MED ORDER — TRAMADOL HCL 50 MG PO TABS
50.0000 mg | ORAL_TABLET | Freq: Four times a day (QID) | ORAL | Status: DC
  Administered 2021-07-28 – 2021-07-29 (×5): 50 mg via ORAL
  Filled 2021-07-28 (×5): qty 1

## 2021-07-28 MED ORDER — METOCLOPRAMIDE HCL 5 MG/ML IJ SOLN: 5.0000 mg | Freq: Three times a day (TID) | INTRAMUSCULAR | Status: DC | PRN

## 2021-07-28 MED ORDER — METHOCARBAMOL 1000 MG/10ML IJ SOLN: 500.0000 mg | Freq: Four times a day (QID) | INTRAVENOUS | Status: DC | PRN

## 2021-07-28 MED ORDER — ZOLPIDEM TARTRATE 5 MG PO TABS: 5.0000 mg | ORAL_TABLET | Freq: Every evening | ORAL | Status: DC | PRN

## 2021-07-28 MED ORDER — DEXAMETHASONE SODIUM PHOSPHATE 4 MG/ML IJ SOLN
INTRAMUSCULAR | Status: DC | PRN
  Administered 2021-07-28: 10 mg via INTRAVENOUS

## 2021-07-28 MED ORDER — BUPIVACAINE-MELOXICAM ER 400-12 MG/14ML IJ SOLN
INTRAMUSCULAR | Status: AC
  Filled 2021-07-28: qty 1

## 2021-07-28 MED ORDER — SODIUM CHLORIDE 0.9 % IV SOLN: INTRAVENOUS | Status: DC

## 2021-07-28 MED ORDER — PHENOL 1.4 % MT LIQD: 1.0000 | OROMUCOSAL | Status: DC | PRN

## 2021-07-28 MED ORDER — OXYCODONE HCL 5 MG/5ML PO SOLN: 5.0000 mg | Freq: Once | ORAL | Status: AC | PRN

## 2021-07-28 MED ORDER — CEFAZOLIN IN SODIUM CHLORIDE 3-0.9 GM/100ML-% IV SOLN
3.0000 g | Freq: Four times a day (QID) | INTRAVENOUS | Status: AC
  Administered 2021-07-28 (×2): 3 g via INTRAVENOUS
  Filled 2021-07-28 (×2): qty 100

## 2021-07-28 MED ORDER — OXYCODONE HCL 5 MG PO TABS
ORAL_TABLET | ORAL | Status: AC
  Filled 2021-07-28: qty 1

## 2021-07-28 MED ORDER — FAMOTIDINE 20 MG PO TABS
ORAL_TABLET | ORAL | Status: AC
  Administered 2021-07-28: 20 mg via ORAL
  Filled 2021-07-28: qty 1

## 2021-07-28 MED ORDER — ONDANSETRON HCL 4 MG/2ML IJ SOLN
INTRAMUSCULAR | Status: AC
  Filled 2021-07-28: qty 2

## 2021-07-28 MED ORDER — NEOMYCIN-POLYMYXIN B GU 40-200000 IR SOLN
Status: AC
  Filled 2021-07-28: qty 20

## 2021-07-28 MED ORDER — ENOXAPARIN SODIUM 40 MG/0.4ML IJ SOSY: 40.0000 mg | PREFILLED_SYRINGE | INTRAMUSCULAR | 0 refills | Status: DC

## 2021-07-28 MED ORDER — ACETAMINOPHEN 500 MG PO TABS
1000.0000 mg | ORAL_TABLET | Freq: Four times a day (QID) | ORAL | Status: AC
  Administered 2021-07-28 – 2021-07-29 (×3): 1000 mg via ORAL
  Filled 2021-07-28 (×4): qty 2

## 2021-07-28 MED ORDER — BUPIVACAINE HCL (PF) 0.5 % IJ SOLN
INTRAMUSCULAR | Status: AC
  Filled 2021-07-28: qty 10

## 2021-07-28 MED ORDER — BUPIVACAINE-MELOXICAM ER 200-6 MG/7ML IJ SOLN
INTRAMUSCULAR | Status: AC
  Filled 2021-07-28: qty 1

## 2021-07-28 MED ORDER — BUPIVACAINE HCL (PF) 0.5 % IJ SOLN
INTRAMUSCULAR | Status: DC | PRN
  Administered 2021-07-28: 3 mL

## 2021-07-28 MED ORDER — FENTANYL CITRATE (PF) 100 MCG/2ML IJ SOLN
25.0000 ug | INTRAMUSCULAR | Status: DC | PRN
  Administered 2021-07-28: 25 ug via INTRAVENOUS

## 2021-07-28 MED ORDER — SENNOSIDES-DOCUSATE SODIUM 8.6-50 MG PO TABS
1.0000 | ORAL_TABLET | Freq: Every evening | ORAL | Status: DC | PRN
  Administered 2021-07-30: 1 via ORAL
  Filled 2021-07-28 (×2): qty 1

## 2021-07-28 MED ORDER — ONDANSETRON HCL 4 MG/2ML IJ SOLN: 4.0000 mg | Freq: Once | INTRAMUSCULAR | Status: DC | PRN

## 2021-07-28 MED ORDER — SODIUM CHLORIDE 0.9 % IR SOLN
Status: DC | PRN
  Administered 2021-07-28: 3012 mL

## 2021-07-28 MED ORDER — ACETAMINOPHEN 10 MG/ML IV SOLN
1000.0000 mg | Freq: Once | INTRAVENOUS | Status: DC | PRN
  Administered 2021-07-28: 1000 mg via INTRAVENOUS

## 2021-07-28 MED ORDER — BISACODYL 5 MG PO TBEC
5.0000 mg | DELAYED_RELEASE_TABLET | Freq: Every day | ORAL | Status: DC | PRN
  Administered 2021-07-29 (×2): 5 mg via ORAL
  Filled 2021-07-28 (×2): qty 1

## 2021-07-28 MED ORDER — SODIUM CHLORIDE 0.9 % IR SOLN
Status: DC | PRN
  Administered 2021-07-28: 502 mL

## 2021-07-28 MED ORDER — TRAMADOL HCL 50 MG PO TABS
50.0000 mg | ORAL_TABLET | Freq: Four times a day (QID) | ORAL | 0 refills | Status: DC | PRN
Start: 2021-07-28 — End: 2022-04-06

## 2021-07-28 MED ORDER — LACTATED RINGERS IV SOLN: INTRAVENOUS | Status: DC

## 2021-07-28 MED ORDER — PROPOFOL 1000 MG/100ML IV EMUL
INTRAVENOUS | Status: AC
  Filled 2021-07-28: qty 100

## 2021-07-28 MED ORDER — ACETAMINOPHEN 10 MG/ML IV SOLN
INTRAVENOUS | Status: AC
  Filled 2021-07-28: qty 100

## 2021-07-28 MED ORDER — FENTANYL CITRATE (PF) 100 MCG/2ML IJ SOLN
INTRAMUSCULAR | Status: AC
  Filled 2021-07-28: qty 2

## 2021-07-28 MED ORDER — BUPIVACAINE-EPINEPHRINE (PF) 0.25% -1:200000 IJ SOLN
INTRAMUSCULAR | Status: AC
  Filled 2021-07-28: qty 30

## 2021-07-28 MED ORDER — SURGIPHOR WOUND IRRIGATION SYSTEM - OPTIME
TOPICAL | Status: DC | PRN
  Administered 2021-07-28: 450 mL via TOPICAL

## 2021-07-28 MED ORDER — MIDAZOLAM HCL 5 MG/5ML IJ SOLN
INTRAMUSCULAR | Status: DC | PRN
  Administered 2021-07-28: 2 mg via INTRAVENOUS

## 2021-07-28 MED ORDER — ONDANSETRON HCL 4 MG/2ML IJ SOLN
4.0000 mg | Freq: Four times a day (QID) | INTRAMUSCULAR | Status: DC | PRN
  Administered 2021-07-28 – 2021-07-30 (×4): 4 mg via INTRAVENOUS
  Filled 2021-07-28 (×4): qty 2

## 2021-07-28 MED ORDER — OXYCODONE HCL 5 MG PO TABS: 5.0000 mg | ORAL_TABLET | ORAL | 0 refills | Status: DC | PRN

## 2021-07-28 SURGICAL SUPPLY — 70 items
APL PRP STRL LF DISP 70% ISPRP (MISCELLANEOUS) ×2
BLADE SAGITTAL 25.0X1.19X90 (BLADE) ×2 IMPLANT
BLADE SAW 90X13X1.19 OSCILLAT (BLADE) ×2 IMPLANT
BLOCK CUTTING FEMUR 3+ LT MED (MISCELLANEOUS) ×1 IMPLANT
BLOCK CUTTING TIBIAL 2 LT (MISCELLANEOUS) ×1 IMPLANT
BNDG ELASTIC 6X5.8 VLCR STR LF (GAUZE/BANDAGES/DRESSINGS) ×2 IMPLANT
CANISTER WOUND CARE 500ML ATS (WOUND CARE) ×2 IMPLANT
CEMENT HV SMART SET (Cement) ×4 IMPLANT
CHLORAPREP W/TINT 26 (MISCELLANEOUS) ×4 IMPLANT
COOLER POLAR GLACIER W/PUMP (MISCELLANEOUS) ×2 IMPLANT
CUFF TOURN SGL QUICK 24 (TOURNIQUET CUFF)
CUFF TOURN SGL QUICK 34 (TOURNIQUET CUFF)
CUFF TRNQT CYL 24X4X16.5-23 (TOURNIQUET CUFF) IMPLANT
CUFF TRNQT CYL 34X4.125X (TOURNIQUET CUFF) IMPLANT
DRAPE 3/4 80X56 (DRAPES) ×4 IMPLANT
DRSG MEPILEX SACRM 8.7X9.8 (GAUZE/BANDAGES/DRESSINGS) ×2 IMPLANT
ELECT CAUTERY BLADE 6.4 (BLADE) ×2 IMPLANT
ELECT REM PT RETURN 9FT ADLT (ELECTROSURGICAL) ×2
ELECTRODE REM PT RTRN 9FT ADLT (ELECTROSURGICAL) ×1 IMPLANT
FEM COMP SZ3 LFT (Joint) ×1 IMPLANT
FEMUR BONE MODEL 4.9010 MEDACT (MISCELLANEOUS) ×1 IMPLANT
GAUZE XEROFORM 1X8 LF (GAUZE/BANDAGES/DRESSINGS) ×2 IMPLANT
GLOVE BIOGEL PI IND STRL 9 (GLOVE) ×1 IMPLANT
GLOVE BIOGEL PI INDICATOR 9 (GLOVE) ×1
GLOVE SURG ORTHO 8.0 STRL STRW (GLOVE) ×2 IMPLANT
GLOVE SURG SYN 9.0  PF PI (GLOVE) ×2
GLOVE SURG SYN 9.0 PF PI (GLOVE) ×1 IMPLANT
GLOVE SURG UNDER LTX SZ8 (GLOVE) ×2 IMPLANT
GOWN SRG 2XL LVL 4 RGLN SLV (GOWNS) ×1 IMPLANT
GOWN STRL NON-REIN 2XL LVL4 (GOWNS) ×2
GOWN STRL REUS W/ TWL LRG LVL3 (GOWN DISPOSABLE) ×1 IMPLANT
GOWN STRL REUS W/ TWL XL LVL3 (GOWN DISPOSABLE) ×1 IMPLANT
GOWN STRL REUS W/TWL LRG LVL3 (GOWN DISPOSABLE) ×2
GOWN STRL REUS W/TWL XL LVL3 (GOWN DISPOSABLE) ×2
HOLDER FOLEY CATH W/STRAP (MISCELLANEOUS) ×2 IMPLANT
HOOD PEEL AWAY FLYTE STAYCOOL (MISCELLANEOUS) ×4 IMPLANT
INSERT TIB FXD SZ2 20MM (Insert) ×1 IMPLANT
IV NS IRRIG 3000ML ARTHROMATIC (IV SOLUTION) ×2 IMPLANT
KIT PREVENA INCISION MGT20CM45 (CANNISTER) ×2 IMPLANT
KIT TURNOVER KIT A (KITS) ×2 IMPLANT
MANIFOLD NEPTUNE II (INSTRUMENTS) ×4 IMPLANT
NDL SAFETY ECLIPSE 18X1.5 (NEEDLE) ×1 IMPLANT
NDL SPNL 20GX3.5 QUINCKE YW (NEEDLE) ×1 IMPLANT
NEEDLE HYPO 18GX1.5 SHARP (NEEDLE) ×2
NEEDLE SPNL 20GX3.5 QUINCKE YW (NEEDLE) ×2 IMPLANT
NS IRRIG 1000ML POUR BTL (IV SOLUTION) ×2 IMPLANT
PACK TOTAL KNEE (MISCELLANEOUS) ×2 IMPLANT
PAD WRAPON POLAR KNEE (MISCELLANEOUS) ×1 IMPLANT
PATELLA RESURFACING MEDACTA 02 (Bone Implant) ×1 IMPLANT
PULSAVAC PLUS IRRIG FAN TIP (DISPOSABLE) ×2
SCALPEL PROTECTED #10 DISP (BLADE) ×4 IMPLANT
SOLUTION IRRIG SURGIPHOR (IV SOLUTION) ×2 IMPLANT
STAPLER SKIN PROX 35W (STAPLE) ×2 IMPLANT
STEM EXTENSION 11MMX30MM (Stem) ×1 IMPLANT
SUCTION FRAZIER HANDLE 10FR (MISCELLANEOUS) ×2
SUCTION TUBE FRAZIER 10FR DISP (MISCELLANEOUS) ×1 IMPLANT
SUT DVC 2 QUILL PDO  T11 36X36 (SUTURE) ×2
SUT DVC 2 QUILL PDO T11 36X36 (SUTURE) ×1 IMPLANT
SUT ETHIBOND 2 V 37 (SUTURE) IMPLANT
SUT V-LOC 90 ABS DVC 3-0 CL (SUTURE) ×2 IMPLANT
SYR 20ML LL LF (SYRINGE) ×2 IMPLANT
SYR 50ML LL SCALE MARK (SYRINGE) ×4 IMPLANT
TIBIAL BONE MODEL LEFT (MISCELLANEOUS) ×1 IMPLANT
TIBIAL TRAY FIXED MEDACTA 0207 (Joint) ×1 IMPLANT
TIP FAN IRRIG PULSAVAC PLUS (DISPOSABLE) ×1 IMPLANT
TOWEL OR 17X26 4PK STRL BLUE (TOWEL DISPOSABLE) ×2 IMPLANT
TOWER CARTRIDGE SMART MIX (DISPOSABLE) ×2 IMPLANT
TRAY FOLEY MTR SLVR 16FR STAT (SET/KITS/TRAYS/PACK) ×2 IMPLANT
WATER STERILE IRR 1000ML POUR (IV SOLUTION) ×2 IMPLANT
WRAPON POLAR PAD KNEE (MISCELLANEOUS) ×2

## 2021-07-28 NOTE — Anesthesia Preprocedure Evaluation (Addendum)
Anesthesia Evaluation  Patient identified by MRN, date of birth, ID band Patient awake    Reviewed: Allergy & Precautions, NPO status , Patient's Chart, lab work & pertinent test results  History of Anesthesia Complications Negative for: history of anesthetic complications  Airway Mallampati: I   Neck ROM: Full    Dental  (+) Missing   Pulmonary Current Smoker (marijuana, 1-2 per day) and Patient abstained from smoking.,    Pulmonary exam normal breath sounds clear to auscultation       Cardiovascular hypertension, Normal cardiovascular exam Rhythm:Regular Rate:Normal  ECG 07/19/21: Normal sinus rhythm   Neuro/Psych PSYCHIATRIC DISORDERS Anxiety Depression Chronic pain    GI/Hepatic GERD  ,  Endo/Other  Class 3 obesity  Renal/GU negative Renal ROS     Musculoskeletal  (+) Arthritis ,   Abdominal   Peds  Hematology  (+) Blood dyscrasia, anemia ,   Anesthesia Other Findings   Reproductive/Obstetrics                            Anesthesia Physical Anesthesia Plan  ASA: 3  Anesthesia Plan: General and Spinal   Post-op Pain Management:    Induction: Intravenous  PONV Risk Score and Plan: 2 and Propofol infusion, TIVA, Treatment may vary due to age or medical condition and Ondansetron  Airway Management Planned: Natural Airway and Nasal Cannula  Additional Equipment:   Intra-op Plan:   Post-operative Plan:   Informed Consent: I have reviewed the patients History and Physical, chart, labs and discussed the procedure including the risks, benefits and alternatives for the proposed anesthesia with the patient or authorized representative who has indicated his/her understanding and acceptance.       Plan Discussed with: CRNA  Anesthesia Plan Comments: (Plan for spinal and GA with natural airway, LMA/GETA backup.  Patient consented for risks of anesthesia including but not limited  to:  - adverse reactions to medications - damage to eyes, teeth, lips or other oral mucosa - nerve damage due to positioning  - sore throat or hoarseness - headache, bleeding, infection, nerve damage 2/2 spinal - damage to heart, brain, nerves, lungs, other parts of body or loss of life  Informed patient about role of CRNA in peri- and intra-operative care.  Patient voiced understanding.)        Anesthesia Quick Evaluation

## 2021-07-28 NOTE — Anesthesia Postprocedure Evaluation (Signed)
Anesthesia Post Note  Patient: [place]  Procedure(s) Performed: TOTAL KNEE ARTHROPLASTY (Left: Knee)  Patient location during evaluation: PACU Anesthesia Type: Spinal Level of consciousness: awake and alert, oriented and patient cooperative Pain management: pain level controlled Vital Signs Assessment: post-procedure vital signs reviewed and stable Respiratory status: spontaneous breathing, nonlabored ventilation and respiratory function stable Cardiovascular status: blood pressure returned to baseline and stable Postop Assessment: adequate PO intake, no headache and spinal receding Anesthetic complications: no   No notable events documented.   Last Vitals:  Vitals:   07/28/21 1100 07/28/21 1116  BP: 97/70 99/76  Pulse: (!) 56 (!) 57  Resp: 15 17  Temp:    SpO2: 95% 97%    Last Pain:  Vitals:   07/28/21 1116  TempSrc:   PainSc: 0-No pain    LLE Motor Response: Purposeful movement (07/28/21 1116) LLE Sensation: Numbness;Tingling (07/28/21 1116) RLE Motor Response: Purposeful movement (07/28/21 1116) RLE Sensation: Numbness;Tingling;No pain (07/28/21 1116)      Reed Breech

## 2021-07-28 NOTE — TOC Progression Note (Signed)
Transition of Care [facility]) - Progression Note    Patient Details  Name: Anna Barker MRN: [id number] Date of Birth: July 25, 1980  Transition of Care [facility]) CM/SW Contact  Marlowe Sax, RN Phone Number: 07/28/2021, 2:16 PM  Clinical Narrative:    Patient is set up with Home health [facility] prior to surgery by surgeons office        Expected Discharge Plan and Services                                                 Social Determinants of Health (SDOH) Interventions    Readmission Risk Interventions     No data to display
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
## 2021-07-28 NOTE — Transfer of Care (Signed)
Immediate Anesthesia Transfer of Care Note  Patient: Anna Barker  Procedure(s) Performed: TOTAL KNEE ARTHROPLASTY (Left: Knee)  Patient Location: PACU  Anesthesia Type:Spinal  Level of Consciousness: awake, alert  and oriented  Airway & Oxygen Therapy: Patient Spontanous Breathing  Post-op Assessment: Report given to RN and Post -op Vital signs reviewed and stable  Post vital signs: Reviewed and stable  Last Vitals:  Vitals Value Taken Time  BP    Temp    Pulse 76 07/28/21 0933  Resp 11 07/28/21 0933  SpO2 93 % 07/28/21 0933  Vitals shown include unvalidated device data.  Last Pain:  Vitals:   07/28/21 0629  TempSrc: Temporal  PainSc: 4          Complications: No notable events documented.

## 2021-07-28 NOTE — Anesthesia Procedure Notes (Addendum)
Spinal  Patient location during procedure: OR Start time: 07/28/2021 7:22 AM End time: 07/28/2021 7:30 AM Reason for block: surgical anesthesia Staffing Performed: other anesthesia staff  Other anesthesia staff: Carmelina Paddock, RN Performed by: Carmelina Paddock, RN Authorized by: Darrin Nipper, MD   Preanesthetic Checklist Completed: patient identified, IV checked, site marked, risks and benefits discussed, surgical consent, monitors and equipment checked, pre-op evaluation and timeout performed Spinal Block Patient position: sitting Prep: Betadine and ChloraPrep Patient monitoring: heart rate, continuous pulse ox, blood pressure and cardiac monitor Approach: midline Location: L4-5 Injection technique: single-shot Needle Needle type: Whitacre and Introducer  Needle gauge: 25 G Needle length: 9 cm Assessment Sensory level: T10 Events: CSF return Additional Notes Negative paresthesia. Negative blood return. Positive free-flowing CSF. Expiration date of kit checked and confirmed. Patient tolerated procedure well, without complications.

## 2021-07-28 NOTE — Discharge Instructions (Signed)

## 2021-07-28 NOTE — Op Note (Signed)
07/28/2021  9:34 AM  PATIENT:  Anna Barker   MRN: [id number]  PRE-OPERATIVE DIAGNOSIS:  Primary localized osteoarthritis of left knee   POST-OPERATIVE DIAGNOSIS:  Same   PROCEDURE:  Procedure(s): Left TOTAL KNEE ARTHROPLASTY   SURGEON: Leitha Schuller, MD   ASSISTANTS: Cranston Neighbor, PA-C   ANESTHESIA:   spinal   EBL: 100 cc   BLOOD ADMINISTERED:none   DRAINS:  Incisional wound VAC     LOCAL MEDICATIONS USED:  MARCAINE    and OTHER Toradol and morphine, topical long-term pain medication Zynleif   SPECIMEN:  Source of Specimen:    Synovium left knee   DISPOSITION OF SPECIMEN:  PATHOLOGY   COUNTS:  YES   TOURNIQUET: 66 minutes at 300 mm Hg   IMPLANTS: Medacta  GMK PS system with 3 left femur, 2 left tibia with short stem and 20 mm insert.  Size 2 patella, all components cemented.   DICTATION: [name] Dictation   patient was brought to the operating room and spinal anesthesia was obtained.  After prepping and draping the left leg in sterile fashion, and after patient identification and timeout procedures were completed, tourniquet was raised  and midline skin incision was made followed by medial parapatellar arthrotomy with mild medial compartment osteoarthritis, severe patellofemoral arthritis and severe lateral compartment arthritis, partial synovectomy was also carried out.   The synovium was abnormal and a biopsy was obtained and sent as a separate specimen for possible PVNS or inflammatory arthritis.  The ACL and PCL and fat pad were excised along with anterior horns of the meniscus. The proximal tibia cutting guide from  the [facility] system was applied and the proximal tibia cut carried out.  The distal femoral cut was carried out in a similar fashion     The 3 femoral cutting guide applied with anterior posterior and chamfer cuts made.  The posterior horns of the menisci were removed at this point.   Injection of the above medication was carried out after the femoral and  tibial cuts were carried out.  The 2 baseplate trial was placed pinned into position and proximal tibial preparation carried out with drilling hand reaming and the keel punch followed by placement of the 3 femur and sizing the tibial insert size 20 millimeter gave the best fit with stability and full extension.  The distal femoral drill holes were made in the notch cut for the trochlear groove along with the hollow drill for the PS implant was then carried out with trials were then removed the patella was cut using the patellar cutting guide and it sized to a size 2 after drill holes have been made  The knee was irrigated with pulsatile lavage and the bony surfaces dried the tibial component was cemented into place first.  Excess cement was removed and the polyethylene insert placed with a torque screw placed with a torque screwdriver tightened.  The distal femoral component was placed and the knee was held in extension as the patellar button was clamped into place.  After the cement was set, excess cement was removed and the knee was again irrigated thoroughly thoroughly irrigated.  The tourniquet was let down and hemostasis checked with electrocautery. The arthrotomy was repaired with a heavy Quill suture,  followed by 3-0 V lock subcuticular closure, skin staples followed by incisional wound VAC and Polar Care.Marland Kitchen   PLAN OF CARE: Admit for overnight observation   PATIENT DISPOSITION:  PACU - hemodynamically stable.

## 2021-07-28 NOTE — H&P (Signed)
Chief Complaint  Patient presents with  Pre-op Exam  Scheduled for TKA 07/28/21 by Dr. Rosita Kea    History of the Present Illness: Anna Barker is a 41 y.o. female here today for history and physical for left total knee arthroplasty with Dr. Kennedy Bucker on 07/28/2021. Patient has severe left knee pain with x-rays showing severe valgus deformity with complete loss of joint space in the lateral compartment of the left knee. Pain has been severe for greater than 1 year. She does Babs the pain is totally disabling, pain located along the patellofemoral and lateral compartments. Her knee will buckle and give way. Patient has pain with any form of standing even for short amount of time. She wears a brace which initially gave her some relief but now is not helping with her pain. Pain interferes with her quality of life and activities daily living. No relief with bracing, medications, injections, TENS units.  The patient takes blood pressure medication and pain medication. She has never had any knee surgeries.  The patient is not currently working. She is not pregnant.  I have reviewed past medical, surgical, social and family history, and allergies as documented in the EMR.  Past Medical History: Past Medical History:  Diagnosis Date  Arthritis  GERD (gastroesophageal reflux disease)  Hypertension  Osteoporosis   Past Surgical History: Past Surgical History:  Procedure Laterality Date  TUBAL LIGATION 2013  CHOLECYSTECTOMY   Past Family History: Family History  Problem Relation Age of Onset  High blood pressure (Hypertension) Mother  Arthritis Mother  Breast cancer Mother  Alcohol abuse Father  Arthritis Father  Alcohol abuse Brother  Alcohol abuse Brother  Mental illness Maternal Grandmother  No Known Problems Maternal Grandfather  No Known Problems Paternal Grandmother  No Known Problems Paternal Grandfather   Medications: Current Outpatient Medications Ordered in Epic   Medication Sig Dispense Refill  cyclobenzaprine (FLEXERIL) 5 MG tablet Take 1 tablet by mouth 3 (three) times daily  hydroCHLOROthiazide (HYDRODIURIL) 25 MG tablet Take 1 tablet (25 mg total) by mouth once daily 90 tablet 3  meloxicam (MOBIC) 15 MG tablet Take 1 tablet by mouth once daily  traMADoL (ULTRAM) 50 mg tablet Take 1 tablet (50 mg total) by mouth every 6 (six) hours as needed for Pain 30 tablet 0   No current Epic-ordered facility-administered medications on file.   Allergies: No Known Allergies   There is no height or weight on file to calculate BMI.  Review of Systems: A comprehensive 14 point ROS was performed, reviewed, and the pertinent orthopaedic findings are documented in the HPI.  There were no vitals filed for this visit.   General Physical Examination:   General:  Well developed, well nourished, no apparent distress, normal affect, antalgic gait  HEENT: Head normocephalic, atraumatic, PERRL.   Abdomen: Soft, non tender, non distended, Bowel sounds present.  Heart: Examination of the heart reveals regular, rate, and rhythm. There is no murmur noted on ascultation. There is a normal apical pulse.  Lungs: Lungs are clear to auscultation. There is no wheeze, rhonchi, or crackles. There is normal expansion of bilateral chest walls.   Musculoskeletal Examination:  On exam, left knee range of motion is 5 to 115 degrees. Valgus deformity. No pain with hip rotation. Left hip has 45 degrees internal and external rotation. Good pulses.  Radiographs:  AP, lateral, standing, and sunrise x-rays of the left knee were reviewed by me today from 05/04/2021. These show valgus deformity. Angle is 25 degrees  of valgus. There is complete erosion of the lateral compartment with significant osteophytes, opening of the medial joint space. Lateral view shows erosion of the tibia on the lateral condyle. There is advanced patellofemoral arthritis. Sunrise view shows severe  patellofemoral degenerative changes to both knees with subluxation of the patella, and significant erosion of the lateral facet.  X-ray Impression Severe patellofemoral and lateral compartment osteoarthritis.  CLINICAL DATA: Left knee pain.   EXAM:  CT OF THE LEFT KNEE WITHOUT CONTRAST   TECHNIQUE:  Multidetector CT imaging of the left knee was performed according to  the standard protocol. Multiplanar CT image reconstructions were  also generated.   RADIATION DOSE REDUCTION: This exam was performed according to the  departmental dose-optimization program which includes automated  exposure control, adjustment of the mA and/or kV according to  patient size and/or use of iterative reconstruction technique.   COMPARISON: None.   FINDINGS:  Bones/Joint/Cartilage   Hip demonstrates no fracture or dislocation. No lytic or blastic  lesion.   Knee demonstrates no fracture or dislocation. No lytic or blastic  lesion. Severe osteoarthritis of the lateral femorotibial  compartment with marginal osteophytes. Severe lateral patellofemoral  compartment osteoarthritis. Small joint effusion. Small Baker's  cyst.   Ankle demonstrates no fracture or dislocation. No lytic or blastic  lesion.   Ligaments   Ligaments are suboptimally evaluated by CT.   Muscles and Tendons  Muscles are normal. No muscle atrophy. No intramuscular fluid  collection or hematoma. Quadriceps tendon and patellar tendon are  intact.   Soft tissue  No fluid collection or hematoma. No soft tissue mass.   IMPRESSION:  1. Severe osteoarthritis of the lateral femorotibial compartment and  patellofemoral compartment of the left knee.   Assessment: ICD-10-CM  1. Primary osteoarthritis of left knee M17.12   Plan:  42 year old female with advanced left knee osteoarthritis. She has severe valgus deformity with complete loss of joint space in the lateral compartment. Her pain is severe and debilitating and  interfering with her ability to walk and stand. Risks, benefits, complications of a left total knee arthroplasty have been discussed with the patient. Patient has agreed and consented procedure with Dr. Kennedy Bucker on 07/28/2021.   Electronically signed by Patience Musca, PA at 07/13/2021 10:05 AM EDT   Reviewed  H+P. No changes noted.

## 2021-07-29 ENCOUNTER — Encounter: Payer: Self-pay | Admitting: Orthopedic Surgery

## 2021-07-29 DIAGNOSIS — M1712 Unilateral primary osteoarthritis, left knee: Secondary | ICD-10-CM | POA: Diagnosis not present

## 2021-07-29 LAB — CBC
HCT: 34 % — ABNORMAL LOW (ref 36.0–46.0)
Hemoglobin: 10.8 g/dL — ABNORMAL LOW (ref 12.0–15.0)
MCH: 27.7 pg (ref 26.0–34.0)
MCHC: 31.8 g/dL (ref 30.0–36.0)
MCV: 87.2 fL (ref 80.0–100.0)
Platelets: 306 10*3/uL (ref 150–400)
RBC: 3.9 MIL/uL (ref 3.87–5.11)
RDW: 14.1 % (ref 11.5–15.5)
WBC: 11.8 10*3/uL — ABNORMAL HIGH (ref 4.0–10.5)
nRBC: 0 % (ref 0.0–0.2)

## 2021-07-29 LAB — SURGICAL PATHOLOGY

## 2021-07-29 LAB — BASIC METABOLIC PANEL
Anion gap: 6 (ref 5–15)
BUN: 12 mg/dL (ref 6–20)
CO2: 26 mmol/L (ref 22–32)
Calcium: 9 mg/dL (ref 8.9–10.3)
Chloride: 108 mmol/L (ref 98–111)
Creatinine, Ser: 0.67 mg/dL (ref 0.44–1.00)
GFR, Estimated: >60 mL/min (ref >60)
Glucose, Bld: 122 mg/dL — ABNORMAL HIGH (ref 70–99)
Potassium: 3.4 mmol/L — ABNORMAL LOW (ref 3.5–5.1)
Sodium: 140 mmol/L (ref 135–145)

## 2021-07-29 MED ORDER — HYDROMORPHONE HCL 2 MG PO TABS
2.0000 mg | ORAL_TABLET | ORAL | Status: DC | PRN
  Administered 2021-07-29 – 2021-07-30 (×2): 2 mg via ORAL
  Filled 2021-07-29 (×2): qty 1

## 2021-07-29 MED ORDER — SCOPOLAMINE 1 MG/3DAYS TD PT72
1.0000 | MEDICATED_PATCH | TRANSDERMAL | Status: DC
  Administered 2021-07-29: 1.5 mg via TRANSDERMAL
  Filled 2021-07-29: qty 1

## 2021-07-29 MED ORDER — TRAMADOL HCL 50 MG PO TABS
100.0000 mg | ORAL_TABLET | Freq: Four times a day (QID) | ORAL | Status: DC
  Administered 2021-07-29 – 2021-07-30 (×4): 100 mg via ORAL
  Filled 2021-07-29 (×4): qty 2

## 2021-07-29 NOTE — Progress Notes (Signed)
   Subjective: 1 Day Post-Op Procedure(s) (LRB): TOTAL KNEE ARTHROPLASTY (Left) Patient reports pain as mild.   Patient is well, and has had no acute complaints or problems Denies any CP, SOB, ABD pain. We will continue therapy today.  Plan is to go Home after hospital stay.  Objective: Vital signs in last 24 hours: Temp:  [97 F (36.1 C)-98.4 F (36.9 C)] 98.2 F (36.8 C) (06/23 0219) Pulse Rate:  [56-86] 59 (06/23 0219) Resp:  [12-20] 15 (06/23 0219) BP: (92-123)/(64-93) 114/81 (06/23 0219) SpO2:  [95 %-100 %] 100 % (06/23 0219)  Intake/Output from previous day: 06/22 0701 - 06/23 0700 In: 3502.9 [P.O.:400; I.V.:3102.9] Out: 925 [Urine:825; Blood:100] Intake/Output this shift: No intake/output data recorded.  Recent Labs    07/28/21 1340 07/29/21 0407  HGB 12.0 10.8*   Recent Labs    07/28/21 1340 07/29/21 0407  WBC 10.0 11.8*  RBC 4.37 3.90  HCT 37.4 34.0*  PLT 318 306   Recent Labs    07/28/21 1340 07/29/21 0407  NA  --  140  K  --  3.4*  CL  --  108  CO2  --  26  BUN  --  12  CREATININE 0.76 0.67  GLUCOSE  --  122*  CALCIUM  --  9.0   No results for input(s): "LABPT", "INR" in the last 72 hours.  EXAM General - Patient is Alert, Appropriate, and Oriented Extremity - Neurovascular intact Sensation intact distally Intact pulses distally Dorsiflexion/Plantar flexion intact No cellulitis present Compartment soft Dressing - dressing C/D/I and no drainage, provena intact with 50cc in canister Motor Function - intact, moving foot and toes well on exam.   Past Medical History:  Diagnosis Date   Anemia    Anxiety    Arthritis    Depression    GERD (gastroesophageal reflux disease)    Hypertension     Assessment/Plan:   1 Day Post-Op Procedure(s) (LRB): TOTAL KNEE ARTHROPLASTY (Left) Principal Problem:   S/P TKR (total knee replacement) using cement, left  Estimated body mass index is 44.95 kg/m as calculated from the following:    Height as of this encounter: 5\' 7"  (1.702 m).   Weight as of this encounter: 130.2 kg. Advance diet Up with therapy Pain well controlled Labs and VSS CM to assist with discharge to home with HHPT pending completion of PT goals  DVT Prophylaxis - Lovenox, TED hose, and SCDs Weight-Bearing as tolerated to left leg   T. Cranston Neighbor, PA-C [facility] 07/29/2021, 7:20 AM

## 2021-07-30 DIAGNOSIS — M1712 Unilateral primary osteoarthritis, left knee: Secondary | ICD-10-CM | POA: Diagnosis not present

## 2021-07-30 LAB — CBC
HCT: 29.2 % — ABNORMAL LOW (ref 36.0–46.0)
Hemoglobin: 9.2 g/dL — ABNORMAL LOW (ref 12.0–15.0)
MCH: 27.9 pg (ref 26.0–34.0)
MCHC: 31.5 g/dL (ref 30.0–36.0)
MCV: 88.5 fL (ref 80.0–100.0)
Platelets: 259 10*3/uL (ref 150–400)
RBC: 3.3 MIL/uL — ABNORMAL LOW (ref 3.87–5.11)
RDW: 14.2 % (ref 11.5–15.5)
WBC: 10.2 10*3/uL (ref 4.0–10.5)
nRBC: 0 % (ref 0.0–0.2)

## 2021-07-30 NOTE — Progress Notes (Signed)
Physical Therapy Treatment Patient Details Name: Anna Barker [place] MRN: [id number] DOB: [birth year] Today's Date: 07/30/2021   History of Present Illness Pt is a 41 y.o. female with PMH of OA, anxiety and depression with chronic pain, arthritis, GERD, and HTN. Pt underwent L TKA on 07/28/21.    PT Comments    Pt was sitting in recliner upon arriving. Resting HR 115 bpm but endorses no symptoms of fatigue/SOB/heart racing. PA in room and ordered EKG but cleared to participate. She was safely able to stand and ambulate but is limited by pain.HR elevated to 130 bpm  Pt is cleared from an acute PT standpoint to DC home with HHPT. Will benefit from post acute PT to address deficits with ROM/strength, and safety with ADLs.    Recommendations for follow up therapy are one component of a multi-disciplinary discharge planning process, led by the attending physician.  Recommendations may be updated based on patient status, additional functional criteria and insurance authorization.  Follow Up Recommendations  Home health PT     Assistance Recommended at Discharge Intermittent Supervision/Assistance  Patient can return home with the following A little help with walking and/or transfers;A little help with bathing/dressing/bathroom;Assistance with cooking/housework;Assist for transportation;Help with stairs or ramp for entrance   Equipment Recommendations  None recommended by PT (pt has all equipment needs met)       Precautions / Restrictions Precautions Precautions: Knee;Fall Restrictions Weight Bearing Restrictions: Yes LLE Weight Bearing: Weight bearing as tolerated     Mobility  Bed Mobility  General bed mobility comments: pt was in recliner pre/post session    Transfers Overall transfer level: Needs assistance Equipment used: Rolling walker (2 wheels) Transfers: Sit to/from Stand Sit to Stand: Supervision  General transfer comment: Pt was able to stand and sit to/from RW without  physical assistance.    Ambulation/Gait Ambulation/Gait assistance: Supervision Gait Distance (Feet): 150 Feet Assistive device: Rolling walker (2 wheels) Gait Pattern/deviations: Antalgic, Knee flexed in stance - left, Decreased weight shift to left, Step-to pattern, Decreased step length - right, Decreased step length - left, Decreased stride length, Decreased stance time - left Gait velocity: decreased     General Gait Details: no LOB or safety concerns with gait however pt's HR elevated form 112- 130 bpm. Pt struggles to heel strike on operative LE during gait 2/2 to pain.   Stairs  General stair comments: pt has performed stair training several time throughout admission and elected not to perform again this date.    Balance Overall balance assessment: Needs assistance Sitting-balance support: No upper extremity supported, Feet supported Sitting balance-Leahy Scale: Good     Standing balance support: Bilateral upper extremity supported, Reliant on assistive device for balance, During functional activity Standing balance-Leahy Scale: Good       Cognition Arousal/Alertness: Awake/alert Behavior During Therapy: WFL for tasks assessed/performed Overall Cognitive Status: Within Functional Limits for tasks assessed    General Comments: A&Ox4           General Comments General comments (skin integrity, edema, etc.): reviewed HEP, ROM exercises and importance of polar care and wound vac      Pertinent Vitals/Pain Pain Assessment Pain Assessment: 0-10 Pain Score: 9  Pain Location: L knee Pain Descriptors / Indicators: Grimacing, Guarding, Discomfort Pain Intervention(s): Limited activity within patient's tolerance, Monitored during session, Premedicated before session, Repositioned, Ice applied     PT Goals (current goals can now be found in the care plan section) Acute Rehab PT Goals Patient Stated Goal: to go  home today Progress towards PT goals: Progressing toward  goals    Frequency    BID      PT Plan Current plan remains appropriate       AM-PAC PT "6 Clicks" Mobility   Outcome Measure  Help needed turning from your back to your side while in a flat bed without using bedrails?: A Little Help needed moving from lying on your back to sitting on the side of a flat bed without using bedrails?: A Little Help needed moving to and from a bed to a chair (including a wheelchair)?: A Little Help needed standing up from a chair using your arms (e.g., wheelchair or bedside chair)?: A Little Help needed to walk in hospital room?: A Little Help needed climbing 3-5 steps with a railing? : A Little 6 Click Score: 18    End of Session         PT Visit Diagnosis: Muscle weakness (generalized) (M62.81);Pain;Difficulty in walking, not elsewhere classified (R26.2) Pain - Right/Left: Left Pain - part of body: Knee     Time: 1040-1108 PT Time Calculation (min) (ACUTE ONLY): 28 min  Charges:  $Gait Training: 8-22 mins $Therapeutic Exercise: 8-22 mins                     Jetta Lout PTA 07/30/21, 12:16 PM

## 2022-01-10 ENCOUNTER — Other Ambulatory Visit: Payer: Self-pay | Admitting: Family Medicine

## 2022-01-10 DIAGNOSIS — Z1231 Encounter for screening mammogram for malignant neoplasm of breast: Secondary | ICD-10-CM

## 2022-03-21 ENCOUNTER — Encounter: Payer: Self-pay | Admitting: Physician Assistant

## 2022-03-21 ENCOUNTER — Emergency Department
Admission: EM | Admit: 2022-03-21 | Discharge: 2022-03-21 | Disposition: A | Discharge status: Home / Self Care | Payer: Medicaid Other | Attending: Emergency Medicine | Admitting: Emergency Medicine

## 2022-03-21 DIAGNOSIS — J029 Acute pharyngitis, unspecified: Secondary | ICD-10-CM | POA: Insufficient documentation

## 2022-03-21 DIAGNOSIS — R509 Fever, unspecified: Secondary | ICD-10-CM | POA: Diagnosis not present

## 2022-03-21 LAB — GROUP A STREP BY PCR: Group A Strep by PCR: DETECTED — AB

## 2022-03-21 MED ORDER — AZITHROMYCIN 250 MG PO TABS: 250.0000 mg | ORAL_TABLET | Freq: Every day | ORAL | 0 refills | Status: AC

## 2022-03-21 MED ORDER — LIDOCAINE VISCOUS HCL 2 % MT SOLN
15.0000 mL | Freq: Once | OROMUCOSAL | Status: AC
  Administered 2022-03-21: 15 mL via OROMUCOSAL
  Filled 2022-03-21: qty 15

## 2022-03-21 MED ORDER — AZITHROMYCIN 500 MG PO TABS
500.0000 mg | ORAL_TABLET | Freq: Once | ORAL | Status: AC
  Administered 2022-03-21: 500 mg via ORAL
  Filled 2022-03-21: qty 1

## 2022-03-21 NOTE — ED Triage Notes (Signed)
Pt sts that on Saturday her throat started to hurt and felt like her glands were swollen.

## 2022-03-21 NOTE — ED Provider Notes (Addendum)
[facility] Emergency Department Provider Note     Event Date/Time   First MD Initiated Contact with Patient 03/21/22 2236     (approximate)   History   Sore Throat   HPI  Anna Barker is a 42 y.o. female presents to the ED for evaluation of sore throat and swollen glands for the last 3 days.  Patient reports objective fevers but denies any nausea, vomiting, or diarrhea.  She also denies any sick contacts.  Patient admits to not taking her blood pressure medicine today.  Physical Exam   Triage Vital Signs: ED Triage Vitals  Enc Vitals Group     BP 03/21/22 2224 (!) 159/130     Pulse Rate 03/21/22 2224 (!) 108     Resp 03/21/22 2224 18     Temp 03/21/22 2224 98.4 F (36.9 C)     Temp Source 03/21/22 2224 Oral     SpO2 03/21/22 2224 95 %     Weight 03/21/22 2223 290 lb (131.5 kg)     Height --      Head Circumference --      Peak Flow --      Pain Score 03/21/22 2223 10     Pain Loc --      Pain Edu? --      Excl. in [place]? --     Most recent vital signs: Vitals:   03/21/22 2224  BP: (!) 159/130  Pulse: (!) 108  Resp: 18  Temp: 98.4 F (36.9 C)  SpO2: 95%    General Awake, no distress.NAD HEENT NCAT. PERRL. EOMI. No rhinorrhea. Mucous membranes are moist.  Uvula is midline and tonsils are large and oropharynx is erythematous.  Patient normal control of oral secretions and normal phonation. CV:  Good peripheral perfusion.  RESP:  Normal effort.  ABD:  No distention.    ED Results / Procedures / Treatments   Labs (all labs ordered are listed, but only abnormal results are displayed) Labs Reviewed  GROUP A STREP BY PCR - Abnormal; Notable for the following components:      Result Value   Group A Strep by PCR DETECTED (*)    All other components within normal limits    EKG   RADIOLOGY   No results found.   PROCEDURES:  Critical Care performed: No  Procedures   MEDICATIONS ORDERED IN ED: Medications   azithromycin (ZITHROMAX) tablet 500 mg (has no administration in time range)  lidocaine (XYLOCAINE) 2 % viscous mouth solution 15 mL (15 mLs Mouth/Throat Given 03/21/22 2248)     IMPRESSION / MDM / ASSESSMENT AND PLAN / ED COURSE  I reviewed the triage vital signs and the nursing notes.                              Differential diagnosis includes, but is not limited to, tonsillitis, pharyngitis, sore throat, sinusitis  Patient's presentation is most consistent with acute complicated illness / injury requiring diagnostic workup.  Patient's diagnosis is consistent with cellulitis. Patient will be discharged home with prescriptions for Zithromycin. Patient is to follow up with her primary provider as needed or otherwise directed. Patient is given ED precautions to return to the ED for any worsening or new symptoms.     FINAL CLINICAL IMPRESSION(S) / ED DIAGNOSES   Final diagnoses:  Sore throat  Pharyngitis, unspecified etiology     Rx / DC Orders  ED Discharge Orders          Ordered    azithromycin (ZITHROMAX Z-PAK) 250 MG tablet  Daily        03/21/22 2323             Note:  This document was prepared using Dragon voice recognition software and may include unintentional dictation errors.    Melvenia Needles, PA-C 03/21/22 2326    Melvenia Needles, PA-C 03/21/22 2330    Rada Hay, MD 03/24/22 [phone]

## 2022-03-21 NOTE — Discharge Instructions (Signed)
Take the antibiotic as directed. Take OTC Tylenol and Motrin as needed for fevers and pain. Follow-up with your primary provider as needed.

## 2022-04-06 ENCOUNTER — Encounter: Payer: Self-pay | Admitting: Student in an Organized Health Care Education/Training Program

## 2022-04-06 ENCOUNTER — Ambulatory Visit
Payer: Medicaid Other | Attending: Student in an Organized Health Care Education/Training Program | Admitting: Student in an Organized Health Care Education/Training Program

## 2022-04-06 VITALS — BP 136/96 | HR 73 | Temp 97.4°F | Resp 18 | Ht 67.0 in | Wt 289.8 lb

## 2022-04-06 DIAGNOSIS — M25561 Pain in right knee: Secondary | ICD-10-CM | POA: Insufficient documentation

## 2022-04-06 DIAGNOSIS — M25552 Pain in left hip: Secondary | ICD-10-CM | POA: Insufficient documentation

## 2022-04-06 DIAGNOSIS — M1711 Unilateral primary osteoarthritis, right knee: Secondary | ICD-10-CM | POA: Diagnosis present

## 2022-04-06 DIAGNOSIS — M25551 Pain in right hip: Secondary | ICD-10-CM | POA: Diagnosis present

## 2022-04-06 DIAGNOSIS — G8929 Other chronic pain: Secondary | ICD-10-CM | POA: Insufficient documentation

## 2022-04-06 DIAGNOSIS — M546 Pain in thoracic spine: Secondary | ICD-10-CM | POA: Diagnosis present

## 2022-04-06 DIAGNOSIS — M542 Cervicalgia: Secondary | ICD-10-CM | POA: Insufficient documentation

## 2022-04-06 DIAGNOSIS — M533 Sacrococcygeal disorders, not elsewhere classified: Secondary | ICD-10-CM | POA: Diagnosis present

## 2022-04-06 DIAGNOSIS — M545 Low back pain, unspecified: Secondary | ICD-10-CM | POA: Insufficient documentation

## 2022-04-06 MED ORDER — GABAPENTIN 100 MG PO CAPS: 100.0000 mg | ORAL_CAPSULE | Freq: Every day | ORAL | 0 refills | Status: DC

## 2022-04-06 NOTE — Patient Instructions (Signed)
GENERAL RISKS AND COMPLICATIONS ° °What are the risk, side effects and possible complications? °Generally speaking, most procedures are safe.  However, with any procedure there are risks, side effects, and the possibility of complications.  The risks and complications are dependent upon the sites that are lesioned, or the type of nerve block to be performed.  The closer the procedure is to the spine, the more serious the risks are.  Great care is taken when placing the radio frequency needles, block needles or lesioning probes, but sometimes complications can occur. °Infection: Any time there is an injection through the skin, there is a risk of infection.  This is why sterile conditions are used for these blocks.  There are four possible types of infection. °Localized skin infection. °Central Nervous System Infection-This can be in the form of Meningitis, which can be deadly. °Epidural Infections-This can be in the form of an epidural abscess, which can cause pressure inside of the spine, causing compression of the spinal cord with subsequent paralysis. This would require an emergency surgery to decompress, and there are no guarantees that the patient would recover from the paralysis. °Discitis-This is an infection of the intervertebral discs.  It occurs in about 1% of discography procedures.  It is difficult to treat and it may lead to surgery. ° °      2. Pain: the needles have to go through skin and soft tissues, will cause soreness. °      3. Damage to internal structures:  The nerves to be lesioned may be near blood vessels or   ° other nerves which can be potentially damaged. °      4. Bleeding: Bleeding is more common if the patient is taking blood thinners such as  aspirin, Coumadin, Ticiid, Plavix, etc., or if he/she have some genetic predisposition  such as hemophilia. Bleeding into the spinal canal can cause compression of the spinal  cord with subsequent paralysis.  This would require an emergency  surgery to  decompress and there are no guarantees that the patient would recover from the  paralysis. °      5. Pneumothorax:  Puncturing of a lung is a possibility, every time a needle is introduced in  the area of the chest or upper back.  Pneumothorax refers to free air around the  collapsed lung(s), inside of the thoracic cavity (chest cavity).  Another two possible  complications related to a similar event would include: Hemothorax and Chylothorax.   These are variations of the Pneumothorax, where instead of air around the collapsed  lung(s), you may have blood or chyle, respectively. °      6. Spinal headaches: They may occur with any procedures in the area of the spine. °      7. Persistent CSF (Cerebro-Spinal Fluid) leakage: This is a rare problem, but may occur  with prolonged intrathecal or epidural catheters either due to the formation of a fistulous  track or a dural tear. °      8. Nerve damage: By working so close to the spinal cord, there is always a possibility of  nerve damage, which could be as serious as a permanent spinal cord injury with  paralysis. °      9. Death:  Although rare, severe deadly allergic reactions known as "Anaphylactic  reaction" can occur to any of the medications used. °     10. Worsening of the symptoms:  We can always make thing worse. ° °What are the chances   of something like this happening? °Chances of any of this occuring are extremely low.  By statistics, you have more of a chance of getting killed in a motor vehicle accident: while driving to the hospital than any of the above occurring .  Nevertheless, you should be aware that they are possibilities.  In general, it is similar to taking a shower.  Everybody knows that you can slip, hit your head and get killed.  Does that mean that you should not shower again?  Nevertheless always keep in mind that statistics do not mean anything if you happen to be on the wrong side of them.  Even if a procedure has a 1 (one) in a  1,000,000 (million) chance of going wrong, it you happen to be that one..Also, keep in mind that by statistics, you have more of a chance of having something go wrong when taking medications. ° °Who should not have this procedure? °If you are on a blood thinning medication (e.g. Coumadin, Plavix, see list of "Blood Thinners"), or if you have an active infection going on, you should not have the procedure.  If you are taking any blood thinners, please inform your physician. ° °How should I prepare for this procedure? °Do not eat or drink anything at least six hours prior to the procedure. °Bring a driver with you .  It cannot be a taxi. °Come accompanied by an adult that can drive you back, and that is strong enough to help you if your legs get weak or numb from the local anesthetic. °Take all of your medicines the morning of the procedure with just enough water to swallow them. °If you have diabetes, make sure that you are scheduled to have your procedure done first thing in the morning, whenever possible. °If you have diabetes, take only half of your insulin dose and notify our nurse that you have done so as soon as you arrive at the clinic. °If you are diabetic, but only take blood sugar pills (oral hypoglycemic), then do not take them on the morning of your procedure.  You may take them after you have had the procedure. °Do not take aspirin or any aspirin-containing medications, at least eleven (11) days prior to the procedure.  They may prolong bleeding. °Wear loose fitting clothing that may be easy to take off and that you would not mind if it got stained with Betadine or blood. °Do not wear any jewelry or perfume °Remove any nail coloring.  It will interfere with some of our monitoring equipment. ° °NOTE: Remember that this is not meant to be interpreted as a complete list of all possible complications.  Unforeseen problems may occur. ° °BLOOD THINNERS °The following drugs contain aspirin or other products,  which can cause increased bleeding during surgery and should not be taken for 2 weeks prior to and 1 week after surgery.  If you should need take something for relief of minor pain, you may take acetaminophen which is found in Tylenol,m Datril, Anacin-3 and Panadol. It is not blood thinner. The products listed below are.  Do not take any of the products listed below in addition to any listed on your instruction sheet. ° °A.P.C or A.P.C with Codeine Codeine Phosphate Capsules #3 Ibuprofen Ridaura  °ABC compound Congesprin Imuran rimadil  °Advil Cope Indocin Robaxisal  °Alka-Seltzer Effervescent Pain Reliever and Antacid Coricidin or Coricidin-D ° Indomethacin Rufen  °Alka-Seltzer plus Cold Medicine Cosprin Ketoprofen S-A-C Tablets  °Anacin Analgesic Tablets or Capsules Coumadin   Korlgesic Salflex  Anacin Extra Strength Analgesic tablets or capsules CP-2 Tablets Lanoril Salicylate  Anaprox Cuprimine Capsules Levenox Salocol  Anexsia-D Dalteparin Magan Salsalate  Anodynos Darvon compound Magnesium Salicylate Sine-off  Ansaid Dasin Capsules Magsal Sodium Salicylate  Anturane Depen Capsules Marnal Soma  APF Arthritis pain formula Dewitt's Pills Measurin Stanback  Argesic Dia-Gesic Meclofenamic Sulfinpyrazone  Arthritis Bayer Timed Release Aspirin Diclofenac Meclomen Sulindac  Arthritis pain formula Anacin Dicumarol Medipren Supac  Analgesic (Safety coated) Arthralgen Diffunasal Mefanamic Suprofen  Arthritis Strength Bufferin Dihydrocodeine Mepro Compound Suprol  Arthropan liquid Dopirydamole Methcarbomol with Aspirin Synalgos  ASA tablets/Enseals Disalcid Micrainin Tagament  Ascriptin Doan's Midol Talwin  Ascriptin A/D Dolene Mobidin Tanderil  Ascriptin Extra Strength Dolobid Moblgesic Ticlid  Ascriptin with Codeine Doloprin or Doloprin with Codeine Momentum Tolectin  Asperbuf Duoprin Mono-gesic Trendar  Aspergum Duradyne Motrin or Motrin IB Triminicin  Aspirin plain, buffered or enteric coated  Durasal Myochrisine Trigesic  Aspirin Suppositories Easprin Nalfon Trillsate  Aspirin with Codeine Ecotrin Regular or Extra Strength Naprosyn Uracel  Atromid-S Efficin Naproxen Ursinus  Auranofin Capsules Elmiron Neocylate Vanquish  Axotal Emagrin Norgesic Verin  Azathioprine Empirin or Empirin with Codeine Normiflo Vitamin E  Azolid Emprazil Nuprin Voltaren  Bayer Aspirin plain, buffered or children's or timed BC Tablets or powders Encaprin Orgaran Warfarin Sodium  Buff-a-Comp Enoxaparin Orudis Zorpin  Buff-a-Comp with Codeine Equegesic Os-Cal-Gesic   Buffaprin Excedrin plain, buffered or Extra Strength Oxalid   Bufferin Arthritis Strength Feldene Oxphenbutazone   Bufferin plain or Extra Strength Feldene Capsules Oxycodone with Aspirin   Bufferin with Codeine Fenoprofen Fenoprofen Pabalate or Pabalate-SF   Buffets II Flogesic Panagesic   Buffinol plain or Extra Strength Florinal or Florinal with Codeine Panwarfarin   Buf-Tabs Flurbiprofen Penicillamine   Butalbital Compound Four-way cold tablets Penicillin   Butazolidin Fragmin Pepto-Bismol   Carbenicillin Geminisyn Percodan   Carna Arthritis Reliever Geopen Persantine   Carprofen Gold's salt Persistin   Chloramphenicol Goody's Phenylbutazone   Chloromycetin Haltrain Piroxlcam   Clmetidine heparin Plaquenil   Cllnoril Hyco-pap Ponstel   Clofibrate Hydroxy chloroquine Propoxyphen         Before stopping any of these medications, be sure to consult the physician who ordered them.  Some, such as Coumadin (Warfarin) are ordered to prevent or treat serious conditions such as "deep thrombosis", "pumonary embolisms", and other heart problems.  The amount of time that you may need off of the medication may also vary with the medication and the reason for which you were taking it.  If you are taking any of these medications, please make sure you notify your pain physician before you undergo any procedures.         Selective Nerve Root  Block Patient Information  Description: Specific nerve roots exit the spinal canal and these nerves can be compressed and inflamed by a bulging disc and bone spurs.  By injecting steroids on the nerve root, we can potentially decrease the inflammation surrounding these nerves, which often leads to decreased pain.  Also, by injecting local anesthesia on the nerve root, this can provide [place] helpful information to give to your referring doctor if it decreases your pain.  Selective nerve root blocks can be done along the spine from the neck to the low back depending on the location of your pain.   After numbing the skin with local anesthesia, a small needle is passed to the nerve root and the position of the needle is verified using x-ray pictures.  After the needle is  in correct position, we then deposit the medication.  You may experience a pressure sensation while this is being done.  The entire block usually lasts less than 15 minutes.  Conditions that may be treated with selective nerve root blocks: Low back and leg pain Spinal stenosis Diagnostic block prior to potential surgery Neck and arm pain Post laminectomy syndrome  Preparation for the injection:  Do not eat any solid food or dairy products within 8 hours of your appointment. You may drink clear liquids up to 3 hours before an appointment.  Clear liquids include water, black coffee, juice or soda.  No milk or cream please. You may take your regular medications, including pain medications, with a sip of water before your appointment.  Diabetics should hold regular insulin (if taken separately) and take 1/2 normal NPH dose the morning of the procedure.  Carry some sugar containing items with you to your appointment. A driver must accompany you and be prepared to drive you home after your procedure. Bring all your current medications with you. An IV may be inserted and sedation may be given at the discretion of the physician. A blood  pressure cuff, EKG, and other monitors will often be applied during the procedure.  Some patients may need to have extra oxygen administered for a short period. You will be asked to provide medical information, including allergies, prior to the procedure.  We must know immediately if you are taking blood  Thinners (like Coumadin) or if you are allergic to IV iodine contrast (dye).  Possible side-effects: All are usually temporary Bleeding from needle site Light headedness Numbness and tingling Decreased blood pressure Weakness in arms/legs Pressure sensation in back/neck Pain at injection site (several days)  Possible complications: All are extremely rare Infection Nerve injury Spinal headache (a headache wore with upright position)  Call if you experience: Fever/chills associated with headache or increased back/neck pain Headache worsened by an upright position New onset weakness or numbness of an extremity below the injection site Hives or difficulty breathing (go to the emergency room) Inflammation or drainage at the injection site(s) Severe back/neck pain greater than usual New symptoms which are concerning to you  Please note:  Although the local anesthetic injected can often make your back or neck feel good for several hours after the injection the pain will likely return.  It takes 3-5 days for steroids to work on the nerve root. You may not notice any pain relief for at least one week.  If effective, we will often do a series of 3 injections spaced 3-6 weeks apart to maximally decrease your pain.    If you have any questions, please call [phone] [facility]

## 2022-04-06 NOTE — Progress Notes (Signed)
Safety precautions to be maintained throughout the outpatient stay will include: orient to surroundings, keep bed in low position, maintain call bell within reach at all times, provide assistance with transfer out of bed and ambulation.   Pt advised to f/u with pcp r/t high BP today
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
## 2022-04-20 ENCOUNTER — Other Ambulatory Visit: Payer: Self-pay | Admitting: Student in an Organized Health Care Education/Training Program

## 2022-04-20 ENCOUNTER — Ambulatory Visit
Admission: RE | Admit: 2022-04-20 | Discharge: 2022-04-20 | Disposition: A | Discharge status: Home / Self Care | Payer: Medicaid Other | Source: Ambulatory Visit | Attending: Student in an Organized Health Care Education/Training Program | Admitting: Student in an Organized Health Care Education/Training Program

## 2022-04-20 DIAGNOSIS — G8929 Other chronic pain: Secondary | ICD-10-CM | POA: Diagnosis present

## 2022-04-20 DIAGNOSIS — M546 Pain in thoracic spine: Secondary | ICD-10-CM | POA: Insufficient documentation

## 2022-04-20 DIAGNOSIS — M25552 Pain in left hip: Secondary | ICD-10-CM | POA: Insufficient documentation

## 2022-04-20 DIAGNOSIS — M545 Low back pain, unspecified: Secondary | ICD-10-CM | POA: Diagnosis present

## 2022-04-20 DIAGNOSIS — M25551 Pain in right hip: Secondary | ICD-10-CM | POA: Insufficient documentation

## 2022-04-20 DIAGNOSIS — M533 Sacrococcygeal disorders, not elsewhere classified: Secondary | ICD-10-CM | POA: Insufficient documentation

## 2022-04-20 DIAGNOSIS — M1711 Unilateral primary osteoarthritis, right knee: Secondary | ICD-10-CM

## 2022-04-20 DIAGNOSIS — M542 Cervicalgia: Secondary | ICD-10-CM | POA: Insufficient documentation

## 2022-04-21 ENCOUNTER — Ambulatory Visit (INDEPENDENT_AMBULATORY_CARE_PROVIDER_SITE_OTHER): Payer: Medicaid Other | Admitting: Orthopaedic Surgery

## 2022-04-21 DIAGNOSIS — G5603 Carpal tunnel syndrome, bilateral upper limbs: Secondary | ICD-10-CM

## 2022-04-21 DIAGNOSIS — M180 Bilateral primary osteoarthritis of first carpometacarpal joints: Secondary | ICD-10-CM | POA: Diagnosis not present

## 2022-04-21 NOTE — Progress Notes (Signed)
Chief Complaint: Bilateral hand or wrist pain     History of Present Illness:    Anna Barker is a 42 y.o. female presents today for follow-up of her bilateral hand pain as well as numbness.  She states that she has been seen previously by Dr. Tempie Donning.  He did trial a period of splinting as well as carpal tunnel injections.  This gave her temporary relief and she is hoping to undergo these again.  She is also complaining of base of thumb pain on both hands.  She is right-hand dominant.  This pain is worse with gripping.  She has previously been seen by Dr. Ernestina Patches where EMG showed mild carpal tunnel syndrome bilaterally    Surgical History:   None  PMH/PSH/Family History/Social History/Meds/Allergies:    Past Medical History:  Diagnosis Date  . Anemia   . Anxiety   . Arthritis   . Depression   . GERD (gastroesophageal reflux disease)   . Hypertension    Past Surgical History:  Procedure Laterality Date  . CHOLECYSTECTOMY    . 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 TUBAL LIGATION    . WISDOM TOOTH EXTRACTION     Social History   Socioeconomic History  . Marital status: Single    Spouse name: Not on file  . Number of children: 3  . Years of education: 44  . Highest education level: High school graduate  Occupational History  . Occupation: not employed   Tobacco Use  . Smoking status: Some Days    Types: Cigarettes  . Smokeless tobacco: Never  Vaping Use  . Vaping Use: Never used  Substance and Sexual Activity  . Alcohol use: Yes    Alcohol/week: 3.0 standard drinks of alcohol    Types: 3 Shots of liquor per week    Comment: rarely  . Drug use: Yes    Frequency: 7.0 times per week    Types: Marijuana    Comment: daily use  . Sexual activity: Yes    Partners: Male    Birth control/protection:  Surgical  Other Topics Concern  . Not on file  Social History Narrative   Patient and her 3 children are currently living at her parents due to a financial hardship and patient being unemployed since May. Patient voices that she has a good relationship with her mom and an unpredictable relationship with her dad due to his issues. Patient reports a close relationship with her sister, but is currently not on good terms with either of her two brothers. Patient reports not having any close friends and is currently not in a relationship.    Social Determinants of Health   Financial Resource Strain: High Risk (01/09/2019)   Overall Financial Resource Strain (CARDIA)   . Difficulty of Paying Living Expenses: Very hard  Food Insecurity: Not on file  Transportation Needs: Not on file  Physical Activity: Not on file  Stress: Stress Concern Present (01/09/2019)   [place]   . Feeling of Stress : Rather much  Social Connections: Moderately Integrated (01/09/2019)   Social Connection and Isolation Panel [NHANES]   . Frequency of Communication with Friends and  Family: More than three times a week   . Frequency of Social Gatherings with Friends and Family: Patient declined   . Attends Religious Services: 1 to 4 times per year   . Active Member of Clubs or Organizations: Yes   . Attends Archivist Meetings: Never   . Marital Status: Never married   No family history on file. No Known Allergies Current Outpatient Medications  Medication Sig Dispense Refill  . Aspirin-Salicylamide-Caffeine (ARTHRITIS STRENGTH BC POWDER PO) Take by mouth.    . gabapentin (NEURONTIN) 100 MG capsule Take 1-3 capsules (100-300 mg total) by mouth at bedtime. 90 capsule 0  . hydrochlorothiazide (HYDRODIURIL) 25 MG tablet Take 1 tablet by mouth daily.     No current facility-administered medications for this visit.   DG Cervical Spine  Complete  Result Date: 04/20/2022 CLINICAL DATA:  Pain EXAM: CERVICAL SPINE - COMPLETE 4+ VIEW COMPARISON:  None Available. FINDINGS: No fracture or malalignment. No neural foraminal narrowing. Mild degenerative changes at C4-5 with a tiny anterior osteophyte. No other significant abnormalities. IMPRESSION: Mild degenerative changes at C4-5. Electronically Signed   By: Dorise Bullion III M.D.   On: 04/20/2022 19:39   DG Lumbar Spine Complete W/Bend  Result Date: 04/20/2022 CLINICAL DATA:  Pain EXAM: LUMBAR SPINE - COMPLETE WITH BENDING VIEWS COMPARISON:  None Available. FINDINGS: The lateral views are limited due to patient body habitus. Trace anterolisthesis of L3 versus L4 on flexion and extension views only. Mild degenerative disc disease most prominent at L3-4 with small anterior osteophytes. Lower lumbar facet degenerative changes not excluded. No other bony or soft tissue abnormalities are noted. IMPRESSION: 1. Trace anterolisthesis of L3 versus L4 on flexion and extension views only. 2. Mild degenerative disc disease most prominent at L3-4. 3. Lower lumbar facet degenerative changes not excluded. Electronically Signed   By: Dorise Bullion III M.D.   On: 04/20/2022 19:37   DG Thoracic Spine 2 View  Result Date: 04/20/2022 CLINICAL DATA:  Pain EXAM: THORACIC SPINE 2 VIEWS COMPARISON:  None Available. FINDINGS: Mild degenerative disc disease of the cervical spine with small anterior osteophytes but no loss of disc space. Mild multilevel degenerative changes in the mid to lower thoracic spine with small anterior osteophytes but no loss of disc space. No fracture or malalignment. IMPRESSION: Mild degenerative changes as above. Electronically Signed   By: Dorise Bullion III M.D.   On: 04/20/2022 19:35   DG HIPS BILAT WITH PELVIS MIN 5 VIEWS  Result Date: 04/20/2022 CLINICAL DATA:  History of chronic pain EXAM: DG HIP (WITH OR WITHOUT PELVIS) 5+V BILAT COMPARISON:  None Available. FINDINGS:  Bilateral assimilation joints at S1. No fractures or dislocations. No significant degenerative change in the hips. No loss of joint space. No bony erosion or bony lesions. IMPRESSION: 1. The hips are unremarkable. 2. Bilateral assimilation joints at S1. Electronically Signed   By: Dorise Bullion III M.D.   On: 04/20/2022 14:49    Review of Systems:   A ROS was performed including pertinent positives and negatives as documented in the HPI.  Physical Exam :   Constitutional: NAD and appears stated age Neurological: Alert and oriented Psych: Appropriate affect and cooperative There were no vitals taken for this visit.   Comprehensive Musculoskeletal Exam:    Bilateral CMC grind is positive, bilateral positive Tinel and Phalen about the wrist.  Full composite fist.  Imaging:     I personally reviewed and interpreted the radiographs.   Assessment:  42 y.o. 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  At today's visit I discussed conservative management which she is already trialed including additional injections versus surgical treatment.  At this time she would like to proceed with continued conservative management.  She has requested bilateral ultrasound-guided injections at today's visit.  Plan :    -Bilateral CMC, bilateral carpal tunnel injection provided significant    Procedure Note  Patient: Anna Barker             Date of Birth: November 01, 1980           MRN: [id number]             Visit Date: 04/21/2022  Procedures: Visit Diagnoses: No diagnosis found.  Small Joint Inj: R thumb CMC on 04/21/2022 11:40 AM   Small Joint Inj: L thumb CMC on 04/21/2022 11:41 AM   Medium Joint Inj: R intercarpal on 04/21/2022 11:41 AM   Medium Joint Inj: L intercarpal on 04/21/2022 11:42 AM         I personally saw and evaluated the patient, and participated in the management and treatment plan.  Vanetta Mulders, MD Attending Physician, Orthopedic  Surgery  This document was dictated using Dragon voice recognition software. A reasonable attempt at proof reading has been made to minimize errors.

## 2022-04-24 ENCOUNTER — Telehealth: Payer: Self-pay | Admitting: Student in an Organized Health Care Education/Training Program

## 2022-04-24 NOTE — Telephone Encounter (Signed)
Patient states she has started taking 2 gabapentin at night. This is not helping the pain during the day. What can she do for her daytime pain. She has appt for procedure on 05-03-22

## 2022-04-25 ENCOUNTER — Ambulatory Visit: Payer: Medicaid Other | Admitting: Orthopaedic Surgery

## 2022-04-25 ENCOUNTER — Encounter (HOSPITAL_BASED_OUTPATIENT_CLINIC_OR_DEPARTMENT_OTHER): Payer: Self-pay | Admitting: Orthopaedic Surgery

## 2022-04-25 NOTE — Telephone Encounter (Signed)
Patient advised.

## 2022-05-03 ENCOUNTER — Encounter: Payer: Self-pay | Admitting: Student in an Organized Health Care Education/Training Program

## 2022-05-03 ENCOUNTER — Ambulatory Visit
Payer: Medicaid Other | Attending: Student in an Organized Health Care Education/Training Program | Admitting: Student in an Organized Health Care Education/Training Program

## 2022-05-03 ENCOUNTER — Ambulatory Visit
Admission: RE | Admit: 2022-05-03 | Discharge: 2022-05-03 | Disposition: A | Discharge status: Home / Self Care | Payer: Medicaid Other | Source: Ambulatory Visit | Attending: Student in an Organized Health Care Education/Training Program | Admitting: Student in an Organized Health Care Education/Training Program

## 2022-05-03 DIAGNOSIS — G8929 Other chronic pain: Secondary | ICD-10-CM

## 2022-05-03 DIAGNOSIS — M1711 Unilateral primary osteoarthritis, right knee: Secondary | ICD-10-CM | POA: Diagnosis present

## 2022-05-03 DIAGNOSIS — M25561 Pain in right knee: Secondary | ICD-10-CM | POA: Diagnosis present

## 2022-05-03 MED ORDER — ROPIVACAINE HCL 2 MG/ML IJ SOLN
9.0000 mL | Freq: Once | INTRAMUSCULAR | Status: AC
  Administered 2022-05-03: 9 mL via PERINEURAL
  Filled 2022-05-03: qty 20

## 2022-05-03 MED ORDER — CELECOXIB 100 MG PO CAPS: 100.0000 mg | ORAL_CAPSULE | Freq: Two times a day (BID) | ORAL | 1 refills | Status: AC

## 2022-05-03 MED ORDER — DEXAMETHASONE SODIUM PHOSPHATE 10 MG/ML IJ SOLN
10.0000 mg | Freq: Once | INTRAMUSCULAR | Status: AC
  Administered 2022-05-03: 10 mg
  Filled 2022-05-03: qty 1

## 2022-05-03 MED ORDER — DIAZEPAM 5 MG PO TABS
ORAL_TABLET | ORAL | Status: AC
  Filled 2022-05-03: qty 1

## 2022-05-03 MED ORDER — DIAZEPAM 5 MG PO TABS
5.0000 mg | ORAL_TABLET | ORAL | Status: AC
  Administered 2022-05-03: 5 mg via ORAL

## 2022-05-03 MED ORDER — LIDOCAINE HCL 2 % IJ SOLN
20.0000 mL | Freq: Once | INTRAMUSCULAR | Status: AC
  Administered 2022-05-03: 400 mg
  Filled 2022-05-03: qty 20

## 2022-05-03 NOTE — Patient Instructions (Addendum)
Consider Magnesium gluconate 500 mg qhs  ____________________________________________________________________________________________  Post-Procedure Discharge Instructions  Instructions: Apply ice:  Purpose: This will minimize any swelling and discomfort after procedure.  When: Day of procedure, as soon as you get home. How: Fill a plastic sandwich bag with crushed ice. Cover it with a small towel and apply to injection site. How long: (15 min on, 15 min off) Apply for 15 minutes then remove x 15 minutes.  Repeat sequence on day of procedure, until you go to bed. Apply heat:  Purpose: To treat any soreness and discomfort from the procedure. When: Starting the next day after the procedure. How: Apply heat to procedure site starting the day following the procedure. How long: May continue to repeat daily, until discomfort goes away. Food intake: Start with clear liquids (like water) and advance to regular food, as tolerated.  Physical activities: Keep activities to a minimum for the first 8 hours after the procedure. After that, then as tolerated. Driving: If you have received any sedation, be responsible and do not drive. You are not allowed to drive for 24 hours after having sedation. Blood thinner: (Applies only to those taking blood thinners) You may restart your blood thinner 6 hours after your procedure. Insulin: (Applies only to Diabetic patients taking insulin) As soon as you can eat, you may resume your normal dosing schedule. Infection prevention: Keep procedure site clean and dry. Shower daily and clean area with soap and water. Post-procedure Pain Diary: Extremely important that this be done correctly and accurately. Recorded information will be used to determine the next step in treatment. For the purpose of accuracy, follow these rules: Evaluate only the area treated. Do not report or include pain from an untreated area. For the purpose of this evaluation, ignore all other areas of  pain, except for the treated area. After your procedure, avoid taking a long nap and attempting to complete the pain diary after you wake up. Instead, set your alarm clock to go off every hour, on the hour, for the initial 8 hours after the procedure. Document the duration of the numbing medicine, and the relief you are getting from it. Do not go to sleep and attempt to complete it later. It will not be accurate. If you received sedation, it is likely that you were given a medication that may cause amnesia. Because of this, completing the diary at a later time may cause the information to be inaccurate. This information is needed to plan your care. Follow-up appointment: Keep your post-procedure follow-up evaluation appointment after the procedure (usually 2 weeks for most procedures, 6 weeks for radiofrequencies). DO NOT FORGET to bring you pain diary with you.   Expect: (What should I expect to see with my procedure?) From numbing medicine (AKA: Local Anesthetics): Numbness or decrease in pain. You may also experience some weakness, which if present, could last for the duration of the local anesthetic. Onset: Full effect within 15 minutes of injected. Duration: It will depend on the type of local anesthetic used. On the average, 1 to 8 hours.  From steroids (Applies only if steroids were used): Decrease in swelling or inflammation. Once inflammation is improved, relief of the pain will follow. Onset of benefits: Depends on the amount of swelling present. The more swelling, the longer it will take for the benefits to be seen. In some cases, up to 10 days. Duration: Steroids will stay in the system x 2 weeks. Duration of benefits will depend on multiple posibilities including  persistent irritating factors. Side-effects: If present, they may typically last 2 weeks (the duration of the steroids). Frequent: Cramps (if they occur, drink Gatorade and take over-the-counter Magnesium 450-500 mg once to twice a  day); water retention with temporary weight gain; increases in blood sugar; decreased immune system response; increased appetite. Occasional: Facial flushing (red, warm cheeks); mood swings; menstrual changes. Uncommon: Long-term decrease or suppression of natural hormones; bone thinning. (These are more common with higher doses or more frequent use. This is why we prefer that our patients avoid having any injection therapies in other practices.)  Very Rare: Severe mood changes; psychosis; aseptic necrosis. From procedure: Some discomfort is to be expected once the numbing medicine wears off. This should be minimal if ice and heat are applied as instructed.  Call if: (When should I call?) You experience numbness and weakness that gets worse with time, as opposed to wearing off. New onset bowel or bladder incontinence. (Applies only to procedures done in the spine)  Emergency Numbers: Durning business hours (Monday - Thursday, 8:00 AM - 4:00 PM) (Friday, 9:00 AM - 12:00 Noon): [phone] After hours: [phone] NOTE: If you are having a problem and are unable connect with, or to talk to a provider, then go to your nearest urgent care or emergency department. If the problem is serious and urgent, please call 911. ____________________________________________________________________________________________

## 2022-05-03 NOTE — Progress Notes (Signed)
PROVIDER NOTE: Information contained herein reflects review and annotations entered in association with encounter. Interpretation of such information and data should be left to medically-trained personnel. Information provided to patient can be located elsewhere in the medical record under "Patient Instructions". Document created using STT-dictation technology, any transcriptional errors that may result from process are unintentional.    Patient: Anna Barker  Service Category: Procedure  Provider: [name], MD  DOB: 1980/11/28  DOS: 05/03/2022  Location: [facility]  MRN: [id number]  Setting: Ambulatory - outpatient  Referring Provider: [name], MD  Type: Established Patient  Specialty: Interventional Pain Management  PCP: Ellene Route   Primary Reason for Visit: Interventional Pain Management Treatment. CC: right knee pain     Procedure:          Anesthesia, Analgesia, Anxiolysis:  Type: Genicular Nerves Block (Superolateral, Superomedial, and Inferomedial Genicular Nerves) #1  CPT: [id number]      Primary Purpose: Diagnostic Region: Lateral, Anterior, and Medial aspects of the knee joint, above and below the knee joint proper. Level: Superior and inferior to the knee joint. Target Area: For Genicular Nerve block(s), the targets are: the superolateral genicular nerve, located in the lateral distal portion of the femoral shaft as it curves to form the lateral epicondyle, in the region of the distal femoral metaphysis; the superomedial genicular nerve, located in the medial distal portion of the femoral shaft as it curves to form the medial epicondyle; and the inferomedial genicular nerve, located in the medial, proximal portion of the tibial shaft, as it curves to form the medial epicondyle, in the region of the proximal tibial metaphysis. Approach: Anterior, percutaneous, ipsilateral approach. Laterality: Right knee  Type: Local Anesthesia with 5 mg PO  Valium Local Anesthetic: Lidocaine 1-2%   Position: Modified Fowler's position with pillows under the targeted knee(s).   Indications: 1. Chronic pain of right knee   2. Primary osteoarthritis of right knee    Pain Score: Pre-procedure: 7 /10 Post-procedure: 0-No pain/10     Pre-op H&P Assessment:  [name] is a 42 y.o. (year old), female patient, seen today for interventional treatment. 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 [name] has a current medication list which includes the following prescription(s): celecoxib, gabapentin, hydrochlorothiazide, and aspirin-salicylamide-caffeine. Her primarily concern today is the Back Pain   Initial Vital Signs:  Pulse/HCG Rate: 78ECG Heart Rate: 77 Temp: (!) 97.2 F (36.2 C) Resp: (!) 8 BP: (!) 140/110 (Dr Holley Raring notifiedDr De Smet notified) SpO2: 100 %  BMI: Estimated body mass index is 45.26 kg/m as calculated from the following:   Height as of this encounter: 5\' 7"  (1.702 m).   Weight as of this encounter: 289 lb (131.1 kg).  Risk Assessment: Allergies: Reviewed. She has No Known Allergies.  Allergy Precautions: None required Coagulopathies: Reviewed. None identified.  Blood-thinner therapy: None at this time Active Infection(s): Reviewed. None identified. [name] is afebrile  Site Confirmation: [name] was asked to confirm the procedure and laterality before marking the site Procedure checklist: Completed Consent: Before the procedure and under the influence of no sedative(s), amnesic(s), or anxiolytics, the patient was informed of the treatment options, risks and possible complications. To fulfill our ethical and legal obligations, as recommended by the American Medical Association's Code of Ethics, I have informed the patient of my clinical impression; the nature and purpose of the treatment or  procedure; the risks, benefits,  and possible complications of the intervention; the alternatives, including doing nothing; the risk(s) and benefit(s) of the alternative treatment(s) or procedure(s); and the risk(s) and benefit(s) of doing nothing. The patient was provided information about the general risks and possible complications associated with the procedure. These may include, but are not limited to: failure to achieve desired goals, infection, bleeding, organ or nerve damage, allergic reactions, paralysis, and death. In addition, the patient was informed of those risks and complications associated to the procedure, such as failure to decrease pain; infection; bleeding; organ or nerve damage with subsequent damage to sensory, motor, and/or autonomic systems, resulting in permanent pain, numbness, and/or weakness of one or several areas of the body; allergic reactions; (i.e.: anaphylactic reaction); and/or death. Furthermore, the patient was informed of those risks and complications associated with the medications. These include, but are not limited to: allergic reactions (i.e.: anaphylactic or anaphylactoid reaction(s)); adrenal axis suppression; blood sugar elevation that in diabetics may result in ketoacidosis or comma; water retention that in patients with history of congestive heart failure may result in shortness of breath, pulmonary edema, and decompensation with resultant heart failure; weight gain; swelling or edema; medication-induced neural toxicity; particulate matter embolism and blood vessel occlusion with resultant organ, and/or nervous system infarction; and/or aseptic necrosis of one or more joints. Finally, the patient was informed that Medicine is not an exact science; therefore, there is also the possibility of unforeseen or unpredictable risks and/or possible complications that may result in a catastrophic outcome. The patient indicated having understood very clearly. We have given the  patient no guarantees and we have made no promises. Enough time was given to the patient to ask questions, all of which were answered to the patient's satisfaction. [name] has indicated that she wanted to continue with the procedure. Attestation: I, the ordering provider, attest that I have discussed with the patient the benefits, risks, side-effects, alternatives, likelihood of achieving goals, and potential problems during recovery for the procedure that I have provided informed consent. Date  Time: 05/03/2022  9:18 AM  Pre-Procedure Preparation:  Monitoring: As per clinic protocol. Respiration, ETCO2, SpO2, BP, heart rate and rhythm monitor placed and checked for adequate function Safety Precautions: Patient was assessed for positional comfort and pressure points before starting the procedure. Time-out: I initiated and conducted the "Time-out" before starting the procedure, as per protocol. The patient was asked to participate by confirming the accuracy of the "Time Out" information. Verification of the correct person, site, and procedure were performed and confirmed by me, the nursing staff, and the patient. "Time-out" conducted as per Joint Commission's Universal Protocol (UP.01.01.01). Time: 1025  Description of Procedure:          Area Prepped: Entire knee area, from mid-thigh to mid-shin, lateral, anterior, and medial aspects. DuraPrep (Iodine Povacrylex [0.7% available iodine] and Isopropyl Alcohol, 74% w/w) Safety Precautions: Aspiration looking for blood return was conducted prior to all injections. At no point did we inject any substances, as a needle was being advanced. No attempts were made at seeking any paresthesias. Safe injection practices and needle disposal techniques used. Medications properly checked for expiration dates. SDV (single dose vial) medications used. Description of the Procedure: Protocol guidelines were followed. The patient was placed in position over the  procedure table. The target area was identified and the area prepped in the usual manner. Skin & deeper tissues infiltrated with local anesthetic. Appropriate amount of time allowed to pass for local anesthetics to take effect. The procedure  needles were then advanced to the target area. Proper needle placement secured. Negative aspiration confirmed. Solution injected in intermittent fashion, asking for systemic symptoms every 0.5cc of injectate. The needles were then removed and the area cleansed, making sure to leave some of the prepping solution back to take advantage of its long term bactericidal properties.  Vitals:   05/03/22 1018 05/03/22 1024 05/03/22 1028 05/03/22 1035  BP: 113/86 125/88 (!) 120/101 (!) 147/121  Pulse:      Resp: 13 12 13 10   Temp:      TempSrc:      SpO2: 98%  99% 100%  Weight:      Height:        Start Time: 1025 hrs. End Time: 1029 hrs. Materials:  Needle(s) Type: Spinal Needle Gauge: 25G Length: 3.5-in Medication(s): Please see orders for medications and dosing details. 6 cc solution made of 5 cc of 0.2% ropivacaine, 1 cc of Decadron 10 mg/cc. 2 cc injected at each level above for the right genicular nerves  Imaging Guidance (Non-Spinal):          Type of Imaging Technique: Fluoroscopy Guidance (Non-Spinal) Indication(s): Assistance in needle guidance and placement for procedures requiring needle placement in or near specific anatomical locations not easily accessible without such assistance. Exposure Time: Please see nurses notes. Contrast: None used. Fluoroscopic Guidance: I was personally present during the use of fluoroscopy. "Tunnel Vision Technique" used to obtain the best possible view of the target area. Parallax error corrected before commencing the procedure. "Direction-depth-direction" technique used to introduce the needle under continuous pulsed fluoroscopy. Once target was reached, antero-posterior, oblique, and lateral fluoroscopic projection  used confirm needle placement in all planes. Images permanently stored in EMR. Interpretation: No contrast injected. I personally interpreted the imaging intraoperatively. Adequate needle placement confirmed in multiple planes. Permanent images saved into the patient's record.  Post-operative Assessment:  Post-procedure Vital Signs:  Pulse/HCG Rate: 7892 Temp: (!) 97.2 F (36.2 C) Resp: 10 BP: (!) 147/121 (MD aware) SpO2: 100 %  EBL: None  Complications: No immediate post-treatment complications observed by team, or reported by patient.  Note: The patient tolerated the entire procedure well. A repeat set of vitals were taken after the procedure and the patient was kept under observation following institutional policy, for this type of procedure. Post-procedural neurological assessment was performed, showing return to baseline, prior to discharge. The patient was provided with post-procedure discharge instructions, including a section on how to identify potential problems. Should any problems arise concerning this procedure, the patient was given instructions to immediately contact us, at any time, without hesitation. In any case, we plan to contact the patient by telephone for a follow-up status report regarding this interventional procedure.  Comments:  No additional relevant information.  Plan of Care  Orders:  Orders Placed This Encounter  Procedures   DG PA[facility] C-ARM 1-60 MIN NO REPORT    Intraoperative interpretation by procedural physician at [facility].    Standing Status:   Standing    Number of Occurrences:   1    Order Specific Question:   Reason for exam:    Answer:   Assistance in needle guidance and placement for procedures requiring needle placement in or near specific anatomical locations not easily accessible without such assistance.    Medications ordered for procedure: Meds ordered this encounter  Medications   lidocaine (XYLOCAINE) 2 % (with  pres) injection 400 mg   diazepam (VALIUM) tablet 5 mg  Make sure Flumazenil is available in the pyxis when using this medication. If oversedation occurs, administer 0.2 mg IV over 15 sec. If after 45 sec no response, administer 0.2 mg again over 1 min; may repeat at 1 min intervals; not to exceed 4 doses (1 mg)   dexamethasone (DECADRON) injection 10 mg   ropivacaine (PF) 2 mg/mL (0.2%) (NAROPIN) injection 9 mL   celecoxib (CELEBREX) 100 MG capsule    Sig: Take 1 capsule (100 mg total) by mouth 2 (two) times daily.    Dispense:  60 capsule    Refill:  1   Medications administered: We administered lidocaine, diazepam, dexamethasone, and ropivacaine (PF) 2 mg/mL (0.2%).  See the medical record for exact dosing, route, and time of administration.  Follow-up plan:   Return in about 8 weeks (around 06/28/2022) for Post Procedure Evaluation, virtual.      Recent Visits Date Type Provider Dept  04/06/22 Office Visit [name], MD [facility]  Showing recent visits within past 90 days and meeting all other requirements Today's Visits Date Type Provider Dept  05/03/22 Procedure visit [name], MD [facility]  Showing today's visits and meeting all other requirements Future Appointments Date Type Provider Dept  06/28/22 Appointment [name], MD [facility]  Showing future appointments within next 90 days and meeting all other requirements  Disposition: Discharge home  Discharge (Date  Time): 05/03/2022; 1040 hrs.   Primary Care Physician: Ellene Route Location: [facility] Note by: [name], MD Date: 05/03/2022; Time: 11:14 AM  Disclaimer:  Medicine is not an exact science. The only guarantee in medicine is that nothing is guaranteed. It is important to note that the decision to proceed with this intervention was based on the information collected from the patient. The Data and conclusions were drawn  from the patient's questionnaire, the interview, and the physical examination. Because the information was provided in large part by the patient, it cannot be guaranteed that it has not been purposely or unconsciously manipulated. Every effort has been made to obtain as much relevant data as possible for this evaluation. It is important to note that the conclusions that lead to this procedure are derived in large part from the available data. Always take into account that the treatment will also be dependent on availability of resources and existing treatment guidelines, considered by other Pain Management Practitioners as being common knowledge and practice, at the time of the intervention. For Medico-Legal purposes, it is also important to point out that variation in procedural techniques and pharmacological choices are the acceptable norm. The indications, contraindications, technique, and results of the above procedure should only be interpreted and judged by a Board-Certified Interventional Pain Specialist with extensive familiarity and expertise in the same exact procedure and technique.

## 2022-05-03 NOTE — Progress Notes (Signed)
Safety precautions to be maintained throughout the outpatient stay will include: orient to surroundings, keep bed in low position, maintain call bell within reach at all times, provide assistance with transfer out of bed and ambulation.  

## 2022-05-04 ENCOUNTER — Telehealth: Payer: Self-pay | Admitting: *Deleted

## 2022-05-04 NOTE — Telephone Encounter (Signed)
No problems post procedure. 

## 2022-05-17 ENCOUNTER — Ambulatory Visit: Payer: Medicaid Other | Attending: Student in an Organized Health Care Education/Training Program

## 2022-05-17 DIAGNOSIS — M25551 Pain in right hip: Secondary | ICD-10-CM | POA: Insufficient documentation

## 2022-05-17 DIAGNOSIS — G8929 Other chronic pain: Secondary | ICD-10-CM | POA: Diagnosis present

## 2022-05-17 DIAGNOSIS — M545 Low back pain, unspecified: Secondary | ICD-10-CM | POA: Insufficient documentation

## 2022-05-17 DIAGNOSIS — M25552 Pain in left hip: Secondary | ICD-10-CM | POA: Insufficient documentation

## 2022-05-17 DIAGNOSIS — M533 Sacrococcygeal disorders, not elsewhere classified: Secondary | ICD-10-CM | POA: Diagnosis present

## 2022-05-17 DIAGNOSIS — M25561 Pain in right knee: Secondary | ICD-10-CM | POA: Insufficient documentation

## 2022-05-17 DIAGNOSIS — M5459 Other low back pain: Secondary | ICD-10-CM

## 2022-05-17 DIAGNOSIS — M542 Cervicalgia: Secondary | ICD-10-CM | POA: Diagnosis not present

## 2022-05-17 DIAGNOSIS — M546 Pain in thoracic spine: Secondary | ICD-10-CM | POA: Diagnosis not present
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
## 2022-05-24 ENCOUNTER — Ambulatory Visit: Payer: Medicaid Other

## 2022-05-24 DIAGNOSIS — M545 Low back pain, unspecified: Secondary | ICD-10-CM | POA: Diagnosis not present

## 2022-05-24 DIAGNOSIS — M5459 Other low back pain: Secondary | ICD-10-CM

## 2022-05-24 DIAGNOSIS — G8929 Other chronic pain: Secondary | ICD-10-CM
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
## 2022-06-05 ENCOUNTER — Ambulatory Visit: Payer: Medicaid Other

## 2022-06-05 DIAGNOSIS — M545 Low back pain, unspecified: Secondary | ICD-10-CM | POA: Diagnosis not present

## 2022-06-05 DIAGNOSIS — G8929 Other chronic pain: Secondary | ICD-10-CM

## 2022-06-05 DIAGNOSIS — M5459 Other low back pain: Secondary | ICD-10-CM
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
## 2022-06-08 ENCOUNTER — Ambulatory Visit
Admission: RE | Admit: 2022-06-08 | Discharge: 2022-06-08 | Disposition: A | Discharge status: Home / Self Care | Payer: Medicaid Other | Source: Ambulatory Visit | Attending: Family Medicine | Admitting: Family Medicine

## 2022-06-08 DIAGNOSIS — Z1231 Encounter for screening mammogram for malignant neoplasm of breast: Secondary | ICD-10-CM | POA: Insufficient documentation

## 2022-06-12 ENCOUNTER — Ambulatory Visit: Payer: Medicaid Other | Attending: Student in an Organized Health Care Education/Training Program

## 2022-06-12 DIAGNOSIS — M5459 Other low back pain: Secondary | ICD-10-CM | POA: Diagnosis present

## 2022-06-12 DIAGNOSIS — M545 Low back pain, unspecified: Secondary | ICD-10-CM | POA: Insufficient documentation

## 2022-06-12 DIAGNOSIS — M533 Sacrococcygeal disorders, not elsewhere classified: Secondary | ICD-10-CM | POA: Diagnosis present

## 2022-06-12 DIAGNOSIS — G8929 Other chronic pain: Secondary | ICD-10-CM
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
## 2022-06-14 ENCOUNTER — Ambulatory Visit: Payer: Medicaid Other

## 2022-06-14 DIAGNOSIS — M5459 Other low back pain: Secondary | ICD-10-CM

## 2022-06-14 DIAGNOSIS — G8929 Other chronic pain: Secondary | ICD-10-CM

## 2022-06-14 DIAGNOSIS — M545 Low back pain, unspecified: Secondary | ICD-10-CM | POA: Diagnosis not present
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
## 2022-06-15 ENCOUNTER — Encounter (HOSPITAL_BASED_OUTPATIENT_CLINIC_OR_DEPARTMENT_OTHER): Payer: Self-pay | Admitting: Orthopaedic Surgery

## 2022-06-19 ENCOUNTER — Ambulatory Visit: Payer: Medicaid Other

## 2022-06-19 DIAGNOSIS — M545 Low back pain, unspecified: Secondary | ICD-10-CM | POA: Diagnosis not present

## 2022-06-19 DIAGNOSIS — G8929 Other chronic pain: Secondary | ICD-10-CM

## 2022-06-19 DIAGNOSIS — M5459 Other low back pain: Secondary | ICD-10-CM
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
## 2022-06-21 ENCOUNTER — Ambulatory Visit: Payer: Medicaid Other

## 2022-06-21 DIAGNOSIS — G8929 Other chronic pain: Secondary | ICD-10-CM

## 2022-06-21 DIAGNOSIS — M545 Low back pain, unspecified: Secondary | ICD-10-CM | POA: Diagnosis not present

## 2022-06-21 DIAGNOSIS — M5459 Other low back pain: Secondary | ICD-10-CM
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
## 2022-06-22 ENCOUNTER — Ambulatory Visit (INDEPENDENT_AMBULATORY_CARE_PROVIDER_SITE_OTHER): Payer: Medicaid Other | Admitting: Orthopaedic Surgery

## 2022-06-22 DIAGNOSIS — M180 Bilateral primary osteoarthritis of first carpometacarpal joints: Secondary | ICD-10-CM | POA: Diagnosis not present

## 2022-06-22 DIAGNOSIS — G5603 Carpal tunnel syndrome, bilateral upper limbs: Secondary | ICD-10-CM

## 2022-06-22 NOTE — Progress Notes (Signed)
Chief Complaint: Bilateral hand or wrist pain     History of Present Illness:   06/22/2022: Presents today for follow-up of her bilateral carpal tunnel syndrome as well as CMC arthritis.  Unfortunately she did not get significant relief from her injections.  She is here today for further discussion  Anna Barker is a 42 y.o. female presents today for follow-up of her bilateral hand pain as well as numbness.  She states that she has been seen previously by Dr. Frazier Butt.  He did trial a period of splinting as well as carpal tunnel injections.  This gave her temporary relief and she is hoping to undergo these again.  She is also complaining of base of thumb pain on both hands.  She is right-hand dominant.  This pain is worse with gripping.  She has previously been seen by Dr. Alvester Morin where EMG showed mild carpal tunnel syndrome bilaterally    Surgical History:   None  PMH/PSH/Family History/Social History/Meds/Allergies:    Past Medical History:  Diagnosis Date   Anemia    Anxiety    Arthritis    Depression    GERD (gastroesophageal reflux disease)    Hypertension    Past Surgical History:  Procedure Laterality Date   CHOLECYSTECTOMY     TOTAL KNEE ARTHROPLASTY Left 07/28/2021   Procedure: TOTAL KNEE ARTHROPLASTY;  Surgeon: Kennedy Bucker, MD;  Location: [facility] ORS;  Service: Orthopedics;  Laterality: Left;   TUBAL LIGATION     WISDOM TOOTH EXTRACTION     Social History   Socioeconomic History   Marital status: Single    Spouse name: Not on file   Number of children: 3   Years of education: 12   Highest education level: High school graduate  Occupational History   Occupation: not employed   Tobacco Use   Smoking status: Some Days    Types: Cigarettes   Smokeless tobacco: Never  Vaping Use   Vaping Use: Never used  Substance and Sexual Activity   Alcohol use: Yes    Alcohol/week: 3.0 standard drinks of alcohol    Types: 3 Shots of  liquor per week    Comment: rarely   Drug use: Yes    Frequency: 7.0 times per week    Types: Marijuana    Comment: daily use   Sexual activity: Yes    Partners: Male    Birth control/protection: Surgical  Other Topics Concern   Not on file  Social History Narrative   Patient and her 3 children are currently living at her parents due to a financial hardship and patient being unemployed since May. Patient voices that she has a good relationship with her mom and an unpredictable relationship with her dad due to his issues. Patient reports a close relationship with her sister, but is currently not on good terms with either of her two brothers. Patient reports not having any close friends and is currently not in a relationship.    Social Determinants of Health   Financial Resource Strain: High Risk (01/09/2019)   Overall Financial Resource Strain (CARDIA)    Difficulty of Paying Living Expenses: Very hard  Food Insecurity: Not on file  Transportation Needs: Not on file  Physical Activity: Not on file  Stress: Stress Concern Present (01/09/2019)   [employer] of Occupational Health -  Occupational Stress Questionnaire    Feeling of Stress : Rather much  Social Connections: Moderately Integrated (01/09/2019)   Social Connection and Isolation Panel [NHANES]    Frequency of Communication with Friends and Family: More than three times a week    Frequency of Social Gatherings with Friends and Family: Patient declined    Attends Religious Services: 1 to 4 times per year    Active Member of [employer] or Organizations: Yes    Attends Banker Meetings: Never    Marital Status: Never married   Family History  Problem Relation Age of Onset   Breast cancer Mother 69   No Known Allergies Current Outpatient Medications  Medication Sig Dispense Refill   Aspirin-Salicylamide-Caffeine (ARTHRITIS STRENGTH BC POWDER PO) Take by mouth.     celecoxib (CELEBREX) 100 MG capsule Take 1  capsule (100 mg total) by mouth 2 (two) times daily. 60 capsule 1   gabapentin (NEURONTIN) 100 MG capsule Take 1-3 capsules (100-300 mg total) by mouth at bedtime. 90 capsule 0   hydrochlorothiazide (HYDRODIURIL) 25 MG tablet Take 1 tablet by mouth daily.     No current facility-administered medications for this visit.   No results found.  Review of Systems:   A ROS was performed including pertinent positives and negatives as documented in the HPI.  Physical Exam :   Constitutional: NAD and appears stated age Neurological: Alert and oriented Psych: Appropriate affect and cooperative There were no vitals taken for this visit.   Comprehensive Musculoskeletal Exam:    Bilateral CMC grind is positive, bilateral positive Tinel and Phalen about the wrist.  Full composite fist.  Imaging:     I personally reviewed and interpreted the radiographs.   Assessment:   42 y.o. 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  Overall she did not get any relief from her injections and bilateral carpal tunnel and bilateral CMC.  She did unfortunately have some labral atrophy and to this point I would not recommend any additional injections.  We did discuss brace usage which she will pursue.  This time she is having left worse than right symptoms.  I would like to plan to referral to my partner Dr. Roda Shutters for discussion of possible left carpal tunnel release and [facility] arthroplasty Plan :    -Plan for referral to Dr. Roda Shutters for possible left carpal tunnel release and [facility] arthroplasty        I personally saw and evaluated the patient, and participated in the management and treatment plan.  Huel Cote, MD Attending Physician, Orthopedic Surgery  This document was dictated using Dragon voice recognition software. A reasonable attempt at proof reading has been made to minimize errors.

## 2022-06-26 ENCOUNTER — Ambulatory Visit: Payer: Medicaid Other

## 2022-06-26 DIAGNOSIS — M545 Low back pain, unspecified: Secondary | ICD-10-CM | POA: Diagnosis not present

## 2022-06-27 ENCOUNTER — Ambulatory Visit (INDEPENDENT_AMBULATORY_CARE_PROVIDER_SITE_OTHER): Payer: Medicaid Other | Admitting: Orthopaedic Surgery

## 2022-06-27 DIAGNOSIS — M1812 Unilateral primary osteoarthritis of first carpometacarpal joint, left hand: Secondary | ICD-10-CM

## 2022-06-27 DIAGNOSIS — G5602 Carpal tunnel syndrome, left upper limb: Secondary | ICD-10-CM
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
## 2022-06-28 ENCOUNTER — Ambulatory Visit: Payer: Medicaid Other

## 2022-06-28 ENCOUNTER — Ambulatory Visit
Payer: Medicaid Other | Attending: Student in an Organized Health Care Education/Training Program | Admitting: Student in an Organized Health Care Education/Training Program

## 2022-06-28 DIAGNOSIS — M25561 Pain in right knee: Secondary | ICD-10-CM | POA: Diagnosis not present

## 2022-06-28 DIAGNOSIS — G8929 Other chronic pain: Secondary | ICD-10-CM | POA: Diagnosis not present

## 2022-06-28 DIAGNOSIS — M1711 Unilateral primary osteoarthritis, right knee: Secondary | ICD-10-CM | POA: Diagnosis not present

## 2022-06-28 DIAGNOSIS — M5459 Other low back pain: Secondary | ICD-10-CM

## 2022-06-28 DIAGNOSIS — M545 Low back pain, unspecified: Secondary | ICD-10-CM
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
## 2022-06-28 NOTE — Progress Notes (Signed)
Patient: Anna Barker  Service Category: E/M  Provider: Edward Jolly, MD  DOB: [birth year]  DOS: 06/28/2022  Location: Office  MRN: [id number]  Setting: Ambulatory outpatient  Referring Provider: Nira Retort  Type: Established Patient  Specialty: Interventional Pain Management  PCP: Nira Retort  Location: Remote location  Delivery: TeleHealth     Virtual Encounter - Pain Management PROVIDER NOTE: Information contained herein reflects review and annotations entered in association with encounter. Interpretation of such information and data should be left to medically-trained personnel. Information provided to patient can be located elsewhere in the medical record under "Patient Instructions". Document created using STT-dictation technology, any transcriptional errors that may result from process are unintentional.    Contact & Pharmacy Preferred: [phone] Home: [phone] (home) Mobile: [phone] (mobile) E-mail: [email or web address]  [facility] [address] ([address]), [place] - [address]) [place] [address] Phone: [phone] Fax: [phone]   Pre-screening  [name] offered "in-person" vs "virtual" encounter. She indicated preferring virtual for this encounter.   Reason COVID-19*  Social distancing based on CDC and AMA recommendations.   I contacted Loren [place] Barker on 06/28/2022 via telephone.      I clearly identified myself as Edward Jolly, MD. I verified that I was speaking with the correct person using two identifiers (Name: Anna Barker, and date of birth: 02/06/1981).  Consent I sought verbal advanced consent from Adelai [place] Barker for virtual visit interactions. I informed [name] of possible security and privacy concerns, risks, and limitations associated with providing "not-in-person" medical evaluation and management services. I also informed [name] of the availability of "in-person"  appointments. Finally, I informed her that there would be a charge for the virtual visit and that she could be  personally, fully or partially, financially responsible for it. [name] expressed understanding and agreed to proceed.   Historic Elements   [name] is a 42 y.o. year old, female patient evaluated today after our last contact on 05/03/2022. [name]  has a past medical history of Anemia, Anxiety, Arthritis, Depression, GERD (gastroesophageal reflux disease), and Hypertension. She also  has a past surgical history that includes Cholecystectomy; Tubal ligation; Wisdom tooth extraction; and Total knee arthroplasty (Left, 07/28/2021). [name] has a current medication list which includes the following prescription(s): aspirin-salicylamide-caffeine, celecoxib, gabapentin, and hydrochlorothiazide. She  reports that she has been smoking cigarettes. She has never used smokeless tobacco. She reports current alcohol use of about 3.0 standard drinks of alcohol per week. She reports current drug use. Frequency: 7.00 times per week. Drug: Marijuana. [name] has no allergies on file.  BMI: Estimated body mass index is 45.26 kg/m as calculated from the following:   Height as of 05/03/22: 5\' 7"  (1.702 m).   Weight as of 05/03/22: 289 lb (131.1 kg). Last encounter: 04/06/2022. Last procedure: 05/03/2022.  HPI  Today, she is being contacted for a post-procedure assessment.   Post-procedure evaluation    Procedure:          Anesthesia, Analgesia, Anxiolysis:  Type: Genicular Nerves Block (Superolateral, Superomedial, and Inferomedial Genicular Nerves) #1  CPT: 64450      Primary Purpose: Diagnostic Region: Lateral, Anterior, and Medial aspects of the knee joint, above and below the knee joint proper. Level: Superior and inferior to the knee joint. Target Area: For Genicular Nerve block(s), the targets are: the superolateral genicular nerve, located in the lateral distal portion of  the femoral shaft as it curves to form the lateral  epicondyle, in the region of the distal femoral metaphysis; the superomedial genicular nerve, located in the medial distal portion of the femoral shaft as it curves to form the medial epicondyle; and the inferomedial genicular nerve, located in the medial, proximal portion of the tibial shaft, as it curves to form the medial epicondyle, in the region of the proximal tibial metaphysis. Approach: Anterior, percutaneous, ipsilateral approach. Laterality: Right knee  Type: Local Anesthesia with 5 mg PO Valium Local Anesthetic: Lidocaine 1-2%   Position: Modified Fowler's position with pillows under the targeted knee(s).   Indications: 1. Chronic pain of right knee   2. Primary osteoarthritis of right knee    Pain Score: Pre-procedure: 7 /10 Post-procedure: 0-No pain/10      Effectiveness:  Initial hour after procedure: 100 %  Subsequent 4-6 hours post-procedure: 100 %  Analgesia past initial 6 hours: 80 % (currently)  Ongoing improvement:  Analgesic:  80% Function: [name] reports improvement in function ROM: [name] reports improvement in ROM   Laboratory Chemistry Profile   Renal Lab Results  Component Value Date   BUN 12 07/29/2021   CREATININE 0.67 07/29/2021   GFRAA >60 04/18/2016   GFRNONAA >60 07/29/2021    Hepatic Lab Results  Component Value Date   AST 12 (L) 07/19/2021   ALT 11 07/19/2021   ALBUMIN 4.0 07/19/2021   ALKPHOS 58 07/19/2021    Electrolytes Lab Results  Component Value Date   NA 140 07/29/2021   K 3.4 (L) 07/29/2021   CL 108 07/29/2021   CALCIUM 9.0 07/29/2021    Bone No results found for: "VD25OH", "VD125OH2TOT", "ZO1096EA5", "WU9811BJ4", "25OHVITD1", "25OHVITD2", "25OHVITD3", "TESTOFREE", "TESTOSTERONE"  Inflammation (CRP: Acute Phase) (ESR: Chronic Phase) No results found for: "CRP", "ESRSEDRATE", "LATICACIDVEN"       Note: Above Lab results reviewed.  Imaging  MM 3D SCREEN  BREAST BILATERAL CLINICAL DATA:  Screening.  EXAM: DIGITAL SCREENING BILATERAL MAMMOGRAM WITH TOMOSYNTHESIS AND CAD  TECHNIQUE: Bilateral screening digital craniocaudal and mediolateral oblique mammograms were obtained. Bilateral screening digital breast tomosynthesis was performed. The images were evaluated with computer-aided detection.  COMPARISON:  None available.  ACR Breast Density Category b: There are scattered areas of fibroglandular density.  FINDINGS: There are no findings suspicious for malignancy.  IMPRESSION: No mammographic evidence of malignancy. A result letter of this screening mammogram will be mailed directly to the patient.  RECOMMENDATION: Screening mammogram in one year. (Code:SM-B-01Y)  BI-RADS CATEGORY  1: Negative.  Electronically Signed   By: Bary Richard M.D.   On: 06/09/2022 10:21  Assessment  The primary encounter diagnosis was Chronic pain of right knee. A diagnosis of Primary osteoarthritis of right knee was also pertinent to this visit.  Plan of Care  80% pain relief that is ongoing after right knee genicular nerve block.  Encourage patient to continue to monitor her symptoms.  She still feels instability of her right knee and I encouraged her to get a knee brace.  Follow-up as needed.  Follow-up plan:   Return for PRN.      Left knee genicular nerve block 10/13/20, bilateral GNB 01/17/21          Recent Visits Date Type Provider Dept  05/03/22 Procedure visit Edward Jolly, MD [facility]  04/06/22 Office Visit Edward Jolly, MD [facility]  Showing recent visits within past 90 days and meeting all other requirements Today's Visits Date Type Provider Dept  06/28/22 Office Visit Edward Jolly, MD [facility]  Showing today's visits and meeting all other requirements Future Appointments No visits were found meeting these conditions. Showing future appointments within next 90 days and meeting all  other requirements  I discussed the assessment and treatment plan with the patient. The patient was provided an opportunity to ask questions and all were answered. The patient agreed with the plan and demonstrated an understanding of the instructions.  Patient advised to call back or seek an in-person evaluation if the symptoms or condition worsens.  Duration of encounter: .  Note by: Edward Jolly, MD Date: 06/28/2022; Time: 1:57 PM

## 2022-07-05 ENCOUNTER — Ambulatory Visit: Payer: Medicaid Other

## 2022-07-05 DIAGNOSIS — M545 Low back pain, unspecified: Secondary | ICD-10-CM | POA: Diagnosis not present

## 2022-07-05 DIAGNOSIS — G8929 Other chronic pain: Secondary | ICD-10-CM

## 2022-07-05 DIAGNOSIS — M5459 Other low back pain: Secondary | ICD-10-CM
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
## 2022-07-10 ENCOUNTER — Ambulatory Visit: Payer: Medicaid Other

## 2022-07-12 ENCOUNTER — Ambulatory Visit: Payer: Medicaid Other | Attending: Student in an Organized Health Care Education/Training Program

## 2022-07-12 DIAGNOSIS — M545 Low back pain, unspecified: Secondary | ICD-10-CM | POA: Diagnosis present

## 2022-07-12 DIAGNOSIS — M533 Sacrococcygeal disorders, not elsewhere classified: Secondary | ICD-10-CM | POA: Diagnosis present

## 2022-07-12 DIAGNOSIS — M5459 Other low back pain: Secondary | ICD-10-CM

## 2022-07-12 DIAGNOSIS — G8929 Other chronic pain: Secondary | ICD-10-CM | POA: Diagnosis present
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
## 2022-07-17 ENCOUNTER — Ambulatory Visit: Payer: Medicaid Other

## 2022-07-17 DIAGNOSIS — G8929 Other chronic pain: Secondary | ICD-10-CM

## 2022-07-17 DIAGNOSIS — M545 Low back pain, unspecified: Secondary | ICD-10-CM | POA: Diagnosis not present

## 2022-07-17 DIAGNOSIS — M5459 Other low back pain: Secondary | ICD-10-CM
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
## 2022-07-18 ENCOUNTER — Encounter (HOSPITAL_BASED_OUTPATIENT_CLINIC_OR_DEPARTMENT_OTHER): Payer: Self-pay | Admitting: Orthopaedic Surgery

## 2022-07-18 ENCOUNTER — Other Ambulatory Visit: Payer: Self-pay

## 2022-07-19 ENCOUNTER — Ambulatory Visit: Payer: Medicaid Other

## 2022-07-19 DIAGNOSIS — M545 Low back pain, unspecified: Secondary | ICD-10-CM | POA: Diagnosis not present

## 2022-07-19 DIAGNOSIS — M5459 Other low back pain: Secondary | ICD-10-CM
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
## 2022-07-21 ENCOUNTER — Encounter (HOSPITAL_BASED_OUTPATIENT_CLINIC_OR_DEPARTMENT_OTHER)
Admission: RE | Admit: 2022-07-21 | Discharge: 2022-07-21 | Disposition: A | Discharge status: Home / Self Care | Payer: Medicaid Other | Source: Ambulatory Visit | Attending: Orthopaedic Surgery | Admitting: Orthopaedic Surgery

## 2022-07-21 DIAGNOSIS — Z01812 Encounter for preprocedural laboratory examination: Secondary | ICD-10-CM | POA: Insufficient documentation

## 2022-07-21 LAB — BASIC METABOLIC PANEL
Anion gap: 10 (ref 5–15)
BUN: 9 mg/dL (ref 6–20)
CO2: 23 mmol/L (ref 22–32)
Calcium: 9.1 mg/dL (ref 8.9–10.3)
Chloride: 104 mmol/L (ref 98–111)
Creatinine, Ser: 0.81 mg/dL (ref 0.44–1.00)
GFR, Estimated: >60 mL/min (ref >60)
Glucose, Bld: 81 mg/dL (ref 70–99)
Potassium: 3.9 mmol/L (ref 3.5–5.1)
Sodium: 137 mmol/L (ref 135–145)

## 2022-07-21 NOTE — Progress Notes (Signed)

## 2022-07-24 ENCOUNTER — Ambulatory Visit: Payer: Medicaid Other

## 2022-07-24 ENCOUNTER — Encounter (HOSPITAL_COMMUNITY): Payer: Self-pay | Admitting: Anesthesiology

## 2022-07-24 DIAGNOSIS — M5459 Other low back pain: Secondary | ICD-10-CM

## 2022-07-24 DIAGNOSIS — M545 Low back pain, unspecified: Secondary | ICD-10-CM | POA: Diagnosis not present

## 2022-07-24 DIAGNOSIS — G8929 Other chronic pain: Secondary | ICD-10-CM

## 2022-07-24 NOTE — Anesthesia Preprocedure Evaluation (Signed)
Anesthesia Evaluation    Reviewed: Allergy & Precautions, Patient's Chart, lab work & pertinent test results  Airway        Dental   Pulmonary sleep apnea , Current Smoker          Cardiovascular hypertension, Pt. on medications      Neuro/Psych  PSYCHIATRIC DISORDERS Anxiety Depression    negative neurological ROS     GI/Hepatic ,GERD  Controlled and Medicated,,(+)     substance abuse (daily marijuana)  marijuana use  Endo/Other    Morbid obesityBMI 45  Renal/GU negative Renal ROS  negative genitourinary   Musculoskeletal  (+) Arthritis , Osteoarthritis,    Abdominal  (+) + obese  Peds  Hematology negative hematology ROS (+)   Anesthesia Other Findings   Reproductive/Obstetrics negative OB ROS                             Anesthesia Physical Anesthesia Plan  ASA: 3  Anesthesia Plan: MAC and Regional   Post-op Pain Management: Regional block* and Tylenol PO (pre-op)*   Induction:   PONV Risk Score and Plan: 2 and Propofol infusion and TIVA  Airway Management Planned: Natural Airway and Simple Face Mask  Additional Equipment: None  Intra-op Plan:   Post-operative Plan:   Informed Consent:   Plan Discussed with:   Anesthesia Plan Comments:        Anesthesia Quick Evaluation
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
## 2022-07-26 ENCOUNTER — Ambulatory Visit (HOSPITAL_BASED_OUTPATIENT_CLINIC_OR_DEPARTMENT_OTHER)
Admission: RE | Admit: 2022-07-26 | Discharge: 2022-07-26 | Disposition: A | Discharge status: Home / Self Care | Payer: Medicaid Other | Attending: Orthopaedic Surgery | Admitting: Orthopaedic Surgery

## 2022-07-26 ENCOUNTER — Other Ambulatory Visit: Payer: Self-pay

## 2022-07-26 ENCOUNTER — Encounter (HOSPITAL_BASED_OUTPATIENT_CLINIC_OR_DEPARTMENT_OTHER): Payer: Self-pay | Admitting: Orthopaedic Surgery

## 2022-07-26 ENCOUNTER — Encounter (HOSPITAL_BASED_OUTPATIENT_CLINIC_OR_DEPARTMENT_OTHER)
Admission: RE | Disposition: A | Discharge status: Home / Self Care | Payer: Self-pay | Source: Home / Self Care | Attending: Orthopaedic Surgery

## 2022-07-26 DIAGNOSIS — M1812 Unilateral primary osteoarthritis of first carpometacarpal joint, left hand: Secondary | ICD-10-CM | POA: Diagnosis not present

## 2022-07-26 DIAGNOSIS — Z539 Procedure and treatment not carried out, unspecified reason: Secondary | ICD-10-CM | POA: Diagnosis not present

## 2022-07-26 DIAGNOSIS — Z5986 Financial insecurity: Secondary | ICD-10-CM | POA: Insufficient documentation

## 2022-07-26 DIAGNOSIS — I10 Essential (primary) hypertension: Secondary | ICD-10-CM | POA: Diagnosis not present

## 2022-07-26 DIAGNOSIS — Z79899 Other long term (current) drug therapy: Secondary | ICD-10-CM

## 2022-07-26 DIAGNOSIS — Z01818 Encounter for other preprocedural examination: Secondary | ICD-10-CM

## 2022-07-26 DIAGNOSIS — G5602 Carpal tunnel syndrome, left upper limb: Secondary | ICD-10-CM | POA: Insufficient documentation

## 2022-07-26 HISTORY — DX: Sleep apnea, unspecified: G47.30

## 2022-07-26 LAB — POCT PREGNANCY, URINE: Preg Test, Ur: NEGATIVE

## 2022-07-26 SURGERY — CARPAL TUNNEL RELEASE
Anesthesia: Monitor Anesthesia Care | Laterality: Left

## 2022-07-26 MED ORDER — LACTATED RINGERS IV SOLN: INTRAVENOUS | Status: DC

## 2022-07-26 MED ORDER — ACETAMINOPHEN 500 MG PO TABS
1000.0000 mg | ORAL_TABLET | Freq: Once | ORAL | Status: AC
  Administered 2022-07-26: 1000 mg via ORAL

## 2022-07-26 MED ORDER — ACETAMINOPHEN 500 MG PO TABS
ORAL_TABLET | ORAL | Status: AC
  Filled 2022-07-26: qty 1

## 2022-07-26 NOTE — Progress Notes (Signed)
Surgery cancelled due to BPs in the 150s/120s.  Patient will see PCP to get HTN under control and will reach out to our office to reschedule surgery.  Patient voiced understanding.

## 2022-07-26 NOTE — H&P (Signed)
PREOPERATIVE H&P  Chief Complaint: left carpal tunnel syndrome, left thumb carpometacarpal osteoarthritis  HPI: Anna Barker is a 42 y.o. female who presents for surgical treatment of left carpal tunnel syndrome, left thumb carpometacarpal osteoarthritis.  She denies any changes in medical history.  Past Medical History:  Diagnosis Date   Anemia    Anxiety    Arthritis    Depression    GERD (gastroesophageal reflux disease)    Hypertension    Sleep apnea    Past Surgical History:  Procedure Laterality Date   CHOLECYSTECTOMY     TOTAL KNEE ARTHROPLASTY Left 07/28/2021   Procedure: TOTAL KNEE ARTHROPLASTY;  Surgeon: Kennedy Bucker, MD;  Location: [facility] ORS;  Service: Orthopedics;  Laterality: Left;   TUBAL LIGATION     WISDOM TOOTH EXTRACTION     Social History   Socioeconomic History   Marital status: Single    Spouse name: Not on file   Number of children: 3   Years of education: 12   Highest education level: High school graduate  Occupational History   Occupation: not employed   Tobacco Use   Smoking status: Some Days    Types: Cigarettes   Smokeless tobacco: Never  Vaping Use   Vaping Use: Never used  Substance and Sexual Activity   Alcohol use: Yes    Alcohol/week: 3.0 standard drinks of alcohol    Types: 3 Shots of liquor per week    Comment: rarely   Drug use: Yes    Frequency: 7.0 times per week    Types: Marijuana    Comment: daily use   Sexual activity: Yes    Partners: Male    Birth control/protection: Surgical  Other Topics Concern   Not on file  Social History Narrative   Patient and her 3 children are currently living at her parents due to a financial hardship and patient being unemployed since May. Patient voices that she has a good relationship with her mom and an unpredictable relationship with her dad due to his issues. Patient reports a close relationship with her sister, but is currently not on good terms with either of her two brothers.  Patient reports not having any close friends and is currently not in a relationship.    Social Determinants of Health   Financial Resource Strain: High Risk (01/09/2019)   Overall Financial Resource Strain (CARDIA)    Difficulty of Paying Living Expenses: Very hard  Food Insecurity: Not on file  Transportation Needs: Not on file  Physical Activity: Not on file  Stress: Stress Concern Present (01/09/2019)   [employer] of Occupational Health - Occupational Stress Questionnaire    Feeling of Stress : Rather much  Social Connections: Moderately Integrated (01/09/2019)   Social Connection and Isolation Panel [NHANES]    Frequency of Communication with Friends and Family: More than three times a week    Frequency of Social Gatherings with Friends and Family: Patient declined    Attends Religious Services: 1 to 4 times per year    Active Member of [employer] or Organizations: Yes    Attends Banker Meetings: Never    Marital Status: Never married   Family History  Problem Relation Age of Onset   Breast cancer Mother 18   No Known Allergies Prior to Admission medications   Medication Sig Start Date End Date Taking? Authorizing Provider  Aspirin-Salicylamide-Caffeine (ARTHRITIS STRENGTH BC POWDER PO) Take by mouth.   Yes [provider]  hydrochlorothiazide (HYDRODIURIL) 25  MG tablet Take 1 tablet by mouth daily. 08/27/20 07/18/22 Yes [provider]  omeprazole (PRILOSEC) 20 MG capsule Take 20 mg by mouth daily.   Yes [provider]     Positive ROS: All other systems have been reviewed and were otherwise negative with the exception of those mentioned in the HPI and as above.  Physical Exam: General: Alert, no acute distress Cardiovascular: No pedal edema Respiratory: No cyanosis, no use of accessory musculature GI: abdomen soft Skin: No lesions in the area of chief complaint Neurologic: Sensation intact distally Psychiatric: Patient is  competent for consent with normal mood and affect Lymphatic: no lymphedema  MUSCULOSKELETAL: exam stable  Assessment: left carpal tunnel syndrome, left thumb carpometacarpal osteoarthritis  Plan: Plan for Procedure(s): LEFT CARPAL TUNNEL RELEASE LEFT CARPOMETACARPAL ARTHROPLASTY  The risks benefits and alternatives were discussed with the patient including but not limited to the risks of nonoperative treatment, versus surgical intervention including infection, bleeding, nerve injury,  blood clots, cardiopulmonary complications, morbidity, mortality, among others, and they were willing to proceed.   Glee Arvin, MD 07/26/2022 11:09 AM

## 2022-07-31 ENCOUNTER — Encounter: Payer: Self-pay | Admitting: Orthopaedic Surgery

## 2022-08-01 NOTE — Telephone Encounter (Signed)
Have her come back in to see [place] since we're looking at the other hand.  Thanks.

## 2022-08-09 ENCOUNTER — Ambulatory Visit (INDEPENDENT_AMBULATORY_CARE_PROVIDER_SITE_OTHER): Payer: Medicaid Other | Admitting: Orthopaedic Surgery

## 2022-08-09 ENCOUNTER — Encounter: Payer: Self-pay | Admitting: Orthopaedic Surgery

## 2022-08-09 ENCOUNTER — Other Ambulatory Visit (INDEPENDENT_AMBULATORY_CARE_PROVIDER_SITE_OTHER): Payer: Medicaid Other

## 2022-08-09 DIAGNOSIS — M1811 Unilateral primary osteoarthritis of first carpometacarpal joint, right hand: Secondary | ICD-10-CM

## 2022-08-09 DIAGNOSIS — G5601 Carpal tunnel syndrome, right upper limb: Secondary | ICD-10-CM

## 2022-08-09 MED ORDER — PREDNISONE 10 MG (21) PO TBPK
ORAL_TABLET | ORAL | 0 refills | Status: DC
Start: 2022-08-09 — End: 2023-03-08

## 2022-08-09 MED ORDER — METHOCARBAMOL 750 MG PO TABS: 750.0000 mg | ORAL_TABLET | Freq: Two times a day (BID) | ORAL | 2 refills | Status: DC | PRN
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
## 2022-09-06 ENCOUNTER — Ambulatory Visit (INDEPENDENT_AMBULATORY_CARE_PROVIDER_SITE_OTHER): Payer: Medicaid Other | Admitting: Orthopedic Surgery

## 2022-09-06 ENCOUNTER — Encounter: Payer: Self-pay | Admitting: Orthopedic Surgery

## 2022-09-06 VITALS — BP 145/100 | HR 112 | Ht 67.0 in | Wt 302.0 lb

## 2022-09-06 DIAGNOSIS — G8929 Other chronic pain: Secondary | ICD-10-CM | POA: Diagnosis not present

## 2022-09-06 DIAGNOSIS — M533 Sacrococcygeal disorders, not elsewhere classified: Secondary | ICD-10-CM | POA: Diagnosis not present

## 2022-09-06 NOTE — Progress Notes (Addendum)
Orthopedic Spine Surgery Office Note  Assessment: Patient is a 42 y.o. female with low back pain with no radicular symptoms   Plan: -Explained that initially conservative treatment is tried as a significant number of patients may experience relief with these treatment modalities. Discussed that the conservative treatments include:  -activity modification  -physical therapy  -over the counter pain medications  -medrol dosepak  -steroid injections -Patient has tried PT, Tylenol, NSAIDs, oral steroids, TENS unit  -Recommended diagnostic/therapeutic bilateral SI joint injections with Dr. Shon Baton -Discussed weight loss and how that would be helpful for her back pain in addition to helping with her overall health.  She has seen PT recently and was encouraged to work on core strengthening.  I told her I agree with that as well for her back pain -Would need to lose weight before any elective spine surgery -Patient should return to office in 6 weeks, x-rays at next visit: None   Patient expressed understanding of the plan and all questions were answered to the patient's satisfaction.   ___________________________________________________________________________   History:  Patient is a 42 y.o. female who presents today for lumbar spine.  Patient has a history of chronic low back pain that has gotten progressively worse since a knee replacement in June 2023.  She says that the pain can be as severe as childbirth at time.  She gets spasms in the low back.  She sometimes feels like it locks up.  She has not had any pain radiating into either lower extremity.  She notes that it is worse if she is standing for a long time like washing the dishes.  It does get better when she lays or sits down.   Weakness: Yes, she feels that her left leg is weaker since getting knee replacement Symptoms of imbalance: Has felt off balance since her knee replacement as well Paresthesias and numbness: Denies Bowel or  bladder incontinence: Has history of functional urinary incontinence.  No other bowel or bladder incontinence.  No recent changes in her bowel or bladder habits Saddle anesthesia: Denies  Treatments tried: PT, Tylenol, NSAIDs, oral steroids, TENS unit  Review of systems: Denies fevers and chills, night sweats, unexplained weight loss, history of cancer.  Has had pain that wakes her at night  Past medical history: Anemia Depression/anxiety GERD HTN OSA Vertigo  Allergies: NKDA  Past surgical history:  Left TKA Cholecystectomy Tubal ligation  Social history: Denies use of nicotine product (smoking, vaping, patches, smokeless) Alcohol use: Yes, approximately 3 drinks per week Denies recreational drug use   Physical Exam:  BMI of 47.3   General: no acute distress, appears stated age Neurologic: alert, answering questions appropriately, following commands Respiratory: unlabored breathing on room air, symmetric chest rise Psychiatric: appropriate affect, normal cadence to speech   MSK (spine):  -Strength exam      Left  Right EHL    5/5  5/5 TA    5/5  5/5 GSC    5/5  5/5 Knee extension  5/5  5/5 Hip flexion   5/5  5/5  -Sensory exam    Sensation intact to light touch in L3-S1 nerve distributions of bilateral lower extremities  -Achilles DTR: 2/4 on the left, 2/4 on the right -Patellar tendon DTR: 2/4 on the left, 2/4 on the right  -Straight leg raise: Negative bilaterally -Femoral nerve stretch test: Negative bilaterally -Clonus: no beats bilaterally -No unsteadiness with gait, ambulates without assistive devices  -Left hip exam: No pain through range of motion,  negative FADIR, positive FABER, positive SI joint compression test, positive Gaenslen's -Right hip exam: No pain through range of motion, negative FADIR, positive FABER, positive SI joint compression test, positive Gaenslen's  Imaging: XR of the lumbar spine from 04/20/2022 was independently  reviewed and interpreted, showing disc height loss at L3/4.  No fracture or dislocation seen.  No evidence of instability on flexion/extension views.   Patient name: Anna Barker Patient MRN: [id number] Date of visit: 09/06/22

## 2022-09-07 ENCOUNTER — Encounter: Payer: Self-pay | Admitting: Orthopedic Surgery

## 2022-09-07 MED ORDER — TIZANIDINE HCL 4 MG PO TABS: 4.0000 mg | ORAL_TABLET | Freq: Four times a day (QID) | ORAL | 1 refills | Status: DC | PRN

## 2022-09-14 ENCOUNTER — Encounter: Payer: Self-pay | Admitting: Sports Medicine

## 2022-09-14 ENCOUNTER — Other Ambulatory Visit: Payer: Self-pay

## 2022-09-14 ENCOUNTER — Ambulatory Visit (INDEPENDENT_AMBULATORY_CARE_PROVIDER_SITE_OTHER): Payer: Medicaid Other | Admitting: Sports Medicine

## 2022-09-14 DIAGNOSIS — M533 Sacrococcygeal disorders, not elsewhere classified: Secondary | ICD-10-CM

## 2022-09-14 DIAGNOSIS — G8929 Other chronic pain: Secondary | ICD-10-CM

## 2022-09-14 NOTE — Progress Notes (Signed)
   Procedure Note  Patient: Anna Barker             Date of Birth: Oct 28, 1980           MRN: [id number]             Visit Date: 09/14/2022  Procedures: Visit Diagnoses:  1. Chronic left SI joint pain   2. Chronic right SI joint pain    U/S-guided SI-joint injection, right   After discussion of risk/benefits/indications, informed verbal consent was obtained. A timeout was then performed. The patient was positioned in a prone position on exam room table with a pillow placed under the pelvis for mild hip flexion. The SI joint area was cleaned and prepped with betadine and alcohol swabs. Sterile ultrasound gel was applied and the ultrasound transducer was placed in an anatomic axial plane over the PSIS, then moved distally over the SI-joint. Using ultrasound guidance, a 22-gauge, 3.5" needle was inserted from a medial to lateral approach utilizing an in-plane approach and directed into the SI-joint. The SI-joint was then injected with a mixture of 4:1 lidocaine:depomedrol with visualization of the injectate flow into the SI-joint under ultrasound visualization. The patient tolerated the procedure well without immediate complications.  U/S-guided SI-joint injection, left   After discussion of risk/benefits/indications, informed verbal consent was obtained. A timeout was then performed. The patient was positioned in a prone position on exam room table with a pillow placed under the pelvis for mild hip flexion. The SI joint area was cleaned and prepped with betadine and alcohol swabs. Sterile ultrasound gel was applied and the ultrasound transducer was placed in an anatomic axial plane over the PSIS, then moved distally over the SI-joint. Using ultrasound guidance, a 22-gauge, 3.5" needle was inserted from a medial to lateral approach utilizing an in-plane approach and directed into the SI-joint. The SI-joint was then injected with a mixture of 4:1 lidocaine:depomedrol with visualization of the  injectate flow into the SI-joint under ultrasound visualization. The patient tolerated the procedure well without immediate complications.  - I evaluated the patient about 5 minutes post-injection and she had some improvement in pain and range of motion, no AE's - follow-up with Dr. Christell Constant as indicated; I am happy to see them as needed  Madelyn Brunner, DO Primary Care Sports Medicine Physician  [facility]  This note was dictated using Dragon naturally speaking software and may contain errors in syntax, spelling, or content which have not been identified prior to signing this note.

## 2022-10-18 ENCOUNTER — Ambulatory Visit: Payer: Medicaid Other | Admitting: Orthopedic Surgery

## 2022-10-18 DIAGNOSIS — G8929 Other chronic pain: Secondary | ICD-10-CM | POA: Diagnosis not present

## 2022-10-18 DIAGNOSIS — M545 Low back pain, unspecified: Secondary | ICD-10-CM

## 2022-10-18 NOTE — Progress Notes (Signed)
Orthopedic Spine Surgery Office Note   Assessment: Patient is a 42 y.o. female with low back pain with no radicular symptoms     Plan: -Patient has tried PT, Tylenol, NSAIDs, oral steroids, TENS unit, SI injections -I told her that since the SI joint injections did not provide her any relief, that points against the SI joints as the etiology of her pain -Since she has had pain for over 6 weeks despite trying conservative treatments, recommended MRI of the lumbar spine to evaluate further -Referred her to pain management since she has tried most of the conservative treatments that I have to offer -I still feel that weight loss would be of benefit to her overall health and would likely help with her back pain -Would need to lose weight before any elective spine surgery -Patient should return to office in 4-5 weeks, x-rays at next visit: none     Patient expressed understanding of the plan and all questions were answered to the patient's satisfaction.    ___________________________________________________________________________     History:   Patient is a 42 y.o. female who presents today for follow-up on her lumbar spine.  After our last visit, she got bilateral SI joint injections with Dr. Shon Baton.  She said she did not get any relief with those injections.  She actually feels that her pain was worse after those injections.  She is still feeling pain in her lower back.  The pain can still be quite severe and feel almost as painful as childbirth.  She does not have any pain radiating to either lower extremity.  Has a history of functional incontinence.  No recent changes in bowel or bladder habits.  Denies saddle anesthesia.  No weakness noted in either lower extremity.     Treatments tried: PT, Tylenol, NSAIDs, oral steroids, TENS unit, SI joint injections    Physical Exam:   General: no acute distress, appears stated age Neurologic: alert, answering questions appropriately, following  commands Respiratory: unlabored breathing on room air, symmetric chest rise Psychiatric: appropriate affect, normal cadence to speech     MSK (spine):   -Strength exam                                                   Left                  Right EHL                              5/5                  5/5 TA                                 5/5                  5/5 GSC                             5/5                  5/5 Knee extension            5/5  5/5 Hip flexion                    5/5                  5/5   -Sensory exam                           Sensation intact to light touch in L3-S1 nerve distributions of bilateral lower extremities     Imaging: XR of the lumbar spine from 04/20/2022 was previously independently reviewed and interpreted, showing disc height loss at L3/4.  No fracture or dislocation seen.  No evidence of instability on flexion/extension views.     Patient name: Anna Barker Patient MRN: [id number] Date of visit: 10/18/22

## 2022-11-02 ENCOUNTER — Ambulatory Visit
Admission: RE | Admit: 2022-11-02 | Discharge: 2022-11-02 | Disposition: A | Discharge status: Home / Self Care | Payer: Medicaid Other | Source: Ambulatory Visit | Attending: Orthopedic Surgery | Admitting: Orthopedic Surgery

## 2022-11-02 DIAGNOSIS — G8929 Other chronic pain: Secondary | ICD-10-CM | POA: Diagnosis present

## 2022-11-02 DIAGNOSIS — M545 Low back pain, unspecified: Secondary | ICD-10-CM | POA: Diagnosis present

## 2022-11-07 ENCOUNTER — Encounter: Payer: Self-pay | Admitting: Student in an Organized Health Care Education/Training Program

## 2022-11-07 ENCOUNTER — Ambulatory Visit
Payer: Medicaid Other | Attending: Student in an Organized Health Care Education/Training Program | Admitting: Student in an Organized Health Care Education/Training Program

## 2022-11-07 VITALS — BP 140/93 | HR 63 | Temp 97.5°F | Resp 16 | Ht 67.0 in | Wt 300.0 lb

## 2022-11-07 DIAGNOSIS — M25562 Pain in left knee: Secondary | ICD-10-CM | POA: Diagnosis present

## 2022-11-07 DIAGNOSIS — G8929 Other chronic pain: Secondary | ICD-10-CM | POA: Diagnosis present

## 2022-11-07 DIAGNOSIS — M25561 Pain in right knee: Secondary | ICD-10-CM | POA: Diagnosis not present

## 2022-11-07 DIAGNOSIS — M1711 Unilateral primary osteoarthritis, right knee: Secondary | ICD-10-CM | POA: Insufficient documentation

## 2022-11-07 DIAGNOSIS — M25551 Pain in right hip: Secondary | ICD-10-CM | POA: Insufficient documentation

## 2022-11-07 DIAGNOSIS — M25552 Pain in left hip: Secondary | ICD-10-CM | POA: Insufficient documentation

## 2022-11-07 DIAGNOSIS — M546 Pain in thoracic spine: Secondary | ICD-10-CM | POA: Insufficient documentation

## 2022-11-07 DIAGNOSIS — M533 Sacrococcygeal disorders, not elsewhere classified: Secondary | ICD-10-CM | POA: Insufficient documentation

## 2022-11-07 DIAGNOSIS — M545 Low back pain, unspecified: Secondary | ICD-10-CM | POA: Diagnosis not present

## 2022-11-07 MED ORDER — KETOROLAC TROMETHAMINE 30 MG/ML IJ SOLN
30.0000 mg | Freq: Once | INTRAMUSCULAR | Status: AC
  Administered 2022-11-07: 30 mg via INTRAMUSCULAR
  Filled 2022-11-07: qty 1

## 2022-11-07 MED ORDER — METHOCARBAMOL 1000 MG/10ML IJ SOLN
200.0000 mg | Freq: Once | INTRAMUSCULAR | Status: AC
  Administered 2022-11-07: 1000 mg via INTRAMUSCULAR
  Filled 2022-11-07: qty 10
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
## 2022-11-24 ENCOUNTER — Telehealth: Payer: Self-pay | Admitting: Student in an Organized Health Care Education/Training Program

## 2022-11-24 NOTE — Telephone Encounter (Signed)
PT called stated that she was calling to let Lateef know her MRI results are in. FYI

## 2022-11-27 ENCOUNTER — Telehealth: Payer: Self-pay

## 2022-11-27 NOTE — Telephone Encounter (Signed)
Patient notifed of MRI results.  States she wants to hold off ont he Physical therapy for now but will take the Naproxen as ordered by Dr Cherylann Ratel.

## 2022-11-28 ENCOUNTER — Telehealth: Payer: Self-pay | Admitting: Student in an Organized Health Care Education/Training Program

## 2022-11-28 DIAGNOSIS — G8929 Other chronic pain: Secondary | ICD-10-CM

## 2022-11-28 NOTE — Telephone Encounter (Signed)
Patient wants to discuss her MRI she didn't understand what was told to her yesterday. Also has some questions about medications. Please call asap

## 2022-11-29 NOTE — Addendum Note (Signed)
Addended by: Edward Jolly on: 11/29/2022 01:01 PM   Modules accepted: Orders

## 2022-12-26 ENCOUNTER — Encounter: Payer: Self-pay | Admitting: Student in an Organized Health Care Education/Training Program

## 2023-01-02 ENCOUNTER — Ambulatory Visit
Payer: Medicaid Other | Attending: Student in an Organized Health Care Education/Training Program | Admitting: Student in an Organized Health Care Education/Training Program

## 2023-01-02 ENCOUNTER — Encounter: Payer: Self-pay | Admitting: Student in an Organized Health Care Education/Training Program

## 2023-01-02 VITALS — BP 124/107 | HR 97 | Temp 97.0°F | Resp 16 | Ht 67.0 in | Wt 280.0 lb

## 2023-01-02 DIAGNOSIS — M47816 Spondylosis without myelopathy or radiculopathy, lumbar region: Secondary | ICD-10-CM

## 2023-01-02 DIAGNOSIS — M1711 Unilateral primary osteoarthritis, right knee: Secondary | ICD-10-CM

## 2023-01-02 DIAGNOSIS — M25561 Pain in right knee: Secondary | ICD-10-CM | POA: Diagnosis present

## 2023-01-02 DIAGNOSIS — G8929 Other chronic pain: Secondary | ICD-10-CM | POA: Diagnosis not present

## 2023-01-02 MED ORDER — KETOROLAC TROMETHAMINE 30 MG/ML IJ SOLN
30.0000 mg | Freq: Once | INTRAMUSCULAR | Status: AC
  Administered 2023-01-02: 30 mg via INTRAMUSCULAR
  Filled 2023-01-02: qty 1

## 2023-01-02 MED ORDER — METHOCARBAMOL 1000 MG/10ML IJ SOLN
200.0000 mg | Freq: Once | INTRAMUSCULAR | Status: AC
  Administered 2023-01-02: 200 mg via INTRAMUSCULAR
  Filled 2023-01-02: qty 10

## 2023-01-02 NOTE — Patient Instructions (Signed)
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
## 2023-01-10 ENCOUNTER — Telehealth: Payer: Self-pay | Admitting: Student in an Organized Health Care Education/Training Program

## 2023-01-10 NOTE — Telephone Encounter (Signed)
Patient is still having pain and wants to see what she can do until her 12-23 RFA appt

## 2023-01-12 ENCOUNTER — Other Ambulatory Visit: Payer: Self-pay | Admitting: Family Medicine

## 2023-01-12 DIAGNOSIS — Z1231 Encounter for screening mammogram for malignant neoplasm of breast: Secondary | ICD-10-CM

## 2023-01-29 ENCOUNTER — Encounter: Payer: Self-pay | Admitting: Student in an Organized Health Care Education/Training Program

## 2023-01-29 ENCOUNTER — Ambulatory Visit
Admission: RE | Admit: 2023-01-29 | Discharge: 2023-01-29 | Disposition: A | Discharge status: Home / Self Care | Payer: Medicare Other | Source: Ambulatory Visit | Attending: Student in an Organized Health Care Education/Training Program | Admitting: Student in an Organized Health Care Education/Training Program

## 2023-01-29 ENCOUNTER — Ambulatory Visit
Payer: Medicare Other | Attending: Student in an Organized Health Care Education/Training Program | Admitting: Student in an Organized Health Care Education/Training Program

## 2023-01-29 VITALS — BP 126/95 | HR 80 | Temp 98.2°F | Resp 14 | Ht 67.0 in | Wt 306.0 lb

## 2023-01-29 DIAGNOSIS — M1711 Unilateral primary osteoarthritis, right knee: Secondary | ICD-10-CM | POA: Insufficient documentation

## 2023-01-29 DIAGNOSIS — G8929 Other chronic pain: Secondary | ICD-10-CM | POA: Insufficient documentation

## 2023-01-29 DIAGNOSIS — M25561 Pain in right knee: Secondary | ICD-10-CM | POA: Insufficient documentation

## 2023-01-29 DIAGNOSIS — M47816 Spondylosis without myelopathy or radiculopathy, lumbar region: Secondary | ICD-10-CM | POA: Insufficient documentation

## 2023-01-29 MED ORDER — DEXAMETHASONE SODIUM PHOSPHATE 10 MG/ML IJ SOLN
20.0000 mg | Freq: Once | INTRAMUSCULAR | Status: AC
  Administered 2023-01-29: 20 mg

## 2023-01-29 MED ORDER — LACTATED RINGERS IV SOLN: Freq: Once | INTRAVENOUS | Status: DC

## 2023-01-29 MED ORDER — LIDOCAINE HCL 2 % IJ SOLN
INTRAMUSCULAR | Status: AC
  Filled 2023-01-29: qty 20

## 2023-01-29 MED ORDER — MIDAZOLAM HCL 2 MG/2ML IJ SOLN
0.5000 mg | Freq: Once | INTRAMUSCULAR | Status: AC
  Administered 2023-01-29: 2 mg via INTRAVENOUS

## 2023-01-29 MED ORDER — ROPIVACAINE HCL 2 MG/ML IJ SOLN
18.0000 mL | Freq: Once | INTRAMUSCULAR | Status: AC
  Administered 2023-01-29: 18 mL via PERINEURAL

## 2023-01-29 MED ORDER — ROPIVACAINE HCL 2 MG/ML IJ SOLN
INTRAMUSCULAR | Status: AC
Start: 2023-01-29 — End: ?
  Filled 2023-01-29: qty 20

## 2023-01-29 MED ORDER — DEXAMETHASONE SODIUM PHOSPHATE 10 MG/ML IJ SOLN
INTRAMUSCULAR | Status: AC
  Filled 2023-01-29: qty 2

## 2023-01-29 MED ORDER — DEXAMETHASONE SODIUM PHOSPHATE 10 MG/ML IJ SOLN: 10.0000 mg | Freq: Once | INTRAMUSCULAR | Status: DC

## 2023-01-29 MED ORDER — LIDOCAINE HCL 2 % IJ SOLN
20.0000 mL | Freq: Once | INTRAMUSCULAR | Status: AC
  Administered 2023-01-29: 400 mg

## 2023-01-29 MED ORDER — ROPIVACAINE HCL 2 MG/ML IJ SOLN: 9.0000 mL | Freq: Once | INTRAMUSCULAR | Status: DC

## 2023-01-29 MED ORDER — MIDAZOLAM HCL 2 MG/2ML IJ SOLN
INTRAMUSCULAR | Status: AC
  Filled 2023-01-29: qty 2

## 2023-01-29 NOTE — Progress Notes (Signed)
PROVIDER NOTE: Interpretation of information contained herein should be left to medically-trained personnel. Specific patient instructions are provided elsewhere under "Patient Instructions" section of medical record. This document was created in part using STT-dictation technology, any transcriptional errors that may result from this process are unintentional.  Patient: Anna Barker Type: Established DOB: [birth year] MRN: [id number] PCP: Nira Retort  Service: Procedure DOS: 01/29/2023 Setting: Ambulatory Location: Ambulatory outpatient facility Delivery: Face-to-face Provider: Edward Jolly, MD Specialty: Interventional Pain Management Specialty designation: 09 Location: Outpatient facility Ref. Prov.: Edward Jolly, MD    Primary Reason for Visit: Interventional Pain Management Treatment. CC: Back Pain (lower) and Knee Pain (right)    Procedure:          Anesthesia, Analgesia, Anxiolysis:  Type: Therapeutic Superolateral, Superomedial, and Inferomedial, Genicular Nerve Radiofrequency Ablation (destruction).   #1  Region: Lateral, Anterior, and Medial aspects of the knee joint, above and below the knee joint proper. Level: Superior and inferior to the knee joint. Laterality: Right  Anesthesia: Local (1-2% Lidocaine)  Anxiolysis: IV Versed 2 mg x 1  Sedation: Minimal  Guidance: Fluoroscopy           Position: Supine   Indications: Right knee OA  Anna Barker has been dealing with the above chronic pain for longer than three months and has either failed to respond, was unable to tolerate, or simply did not get enough benefit from other more conservative therapies including, but not limited to: 1. Over-the-counter medications 2. Anti-inflammatory medications 3. Muscle relaxants 4. Membrane stabilizers 5. Opioids 6. Physical therapy and/or chiropractic manipulation 7. Modalities (Heat, ice, etc.) 8. Invasive techniques such as nerve blocks. Anna Barker has attained more  than 50% relief of the pain from a series of diagnostic injections conducted in separate occasions.  Pain Score: Pre-procedure: 5 /10 Post-procedure:  (2-back, 0-knee)/10    Pre-op H&P Assessment:  Anna Barker is a 42 y.o. (year old), female patient, seen today for interventional treatment. 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 Anna Barker has a current medication list which includes the following prescription(s): amlodipine, omeprazole, tizanidine, hydrochlorothiazide, mobic, and prednisone, and the following Facility-Administered Medications: lactated ringers and ropivacaine (pf) 2 mg/ml (0.2%). Her primarily concern today is the Back Pain (lower) and Knee Pain (right)  Initial Vital Signs:  Pulse/HCG Rate: 80ECG Heart Rate: 81 Temp: 98.2 F (36.8 C) Resp: 16 BP: (!) 127/93 SpO2: 100 %  BMI: Estimated body mass index is 47.93 kg/m as calculated from the following:   Height as of this encounter: 5\' 7"  (1.702 m).   Weight as of this encounter: 306 lb (138.8 kg).  Risk Assessment: Allergies: Reviewed. She has no known allergies.  Allergy Precautions: None required Coagulopathies: Reviewed. None identified.  Blood-thinner therapy: None at this time Active Infection(s): Reviewed. None identified. Anna Barker is afebrile  Site Confirmation: Anna Barker was asked to confirm the procedure and laterality before marking the site Procedure checklist: Completed Consent: Before the procedure and under the influence of no sedative(s), amnesic(s), or anxiolytics, the patient was informed of the treatment options, risks and possible complications. To fulfill our ethical and legal obligations, as recommended by the American Medical Association's Code of Ethics, I have informed the patient of my clinical impression; the nature and purpose of the treatment or procedure; the risks,  benefits, and possible complications of the intervention; the alternatives, including doing nothing; the risk(s) and benefit(s) of the alternative treatment(s)  or procedure(s); and the risk(s) and benefit(s) of doing nothing. The patient was provided information about the general risks and possible complications associated with the procedure. These may include, but are not limited to: failure to achieve desired goals, infection, bleeding, organ or nerve damage, allergic reactions, paralysis, and death. In addition, the patient was informed of those risks and complications associated to the procedure, such as failure to decrease pain; infection; bleeding; organ or nerve damage with subsequent damage to sensory, motor, and/or autonomic systems, resulting in permanent pain, numbness, and/or weakness of one or several areas of the body; allergic reactions; (i.e.: anaphylactic reaction); and/or death. Furthermore, the patient was informed of those risks and complications associated with the medications. These include, but are not limited to: allergic reactions (i.e.: anaphylactic or anaphylactoid reaction(s)); adrenal axis suppression; blood sugar elevation that in diabetics may result in ketoacidosis or comma; water retention that in patients with history of congestive heart failure may result in shortness of breath, pulmonary edema, and decompensation with resultant heart failure; weight gain; swelling or edema; medication-induced neural toxicity; particulate matter embolism and blood vessel occlusion with resultant organ, and/or nervous system infarction; and/or aseptic necrosis of one or more joints. Finally, the patient was informed that Medicine is not an exact science; therefore, there is also the possibility of unforeseen or unpredictable risks and/or possible complications that may result in a catastrophic outcome. The patient indicated having understood very clearly. We have given the patient no guarantees  and we have made no promises. Enough time was given to the patient to ask questions, all of which were answered to the patient's satisfaction. Anna Barker has indicated that she wanted to continue with the procedure. Attestation: I, the ordering provider, attest that I have discussed with the patient the benefits, risks, side-effects, alternatives, likelihood of achieving goals, and potential problems during recovery for the procedure that I have provided informed consent. Date  Time: 01/29/2023  8:03 AM  Pre-Procedure Preparation:  Monitoring: As per clinic protocol. Respiration, ETCO2, SpO2, BP, heart rate and rhythm monitor placed and checked for adequate function Safety Precautions: Patient was assessed for positional comfort and pressure points before starting the procedure. Time-out: I initiated and conducted the "Time-out" before starting the procedure, as per protocol. The patient was asked to participate by confirming the accuracy of the "Time Out" information. Verification of the correct person, site, and procedure were performed and confirmed by me, the nursing staff, and the patient. "Time-out" conducted as per Joint Commission's Universal Protocol (UP.01.01.01). Time: 0913  Description of Procedure:          Target Area: For Genicular Nerve radiofrequency ablation (destruction), the targets are: the superolateral genicular nerve, located in the lateral distal portion of the femoral shaft as it curves to form the lateral epicondyle, in the region of the distal femoral metaphysis; the superomedial genicular nerve, located in the medial distal portion of the femoral shaft as it curves to form the medial epicondyle; and the inferomedial genicular nerve, located in the medial, proximal portion of the tibial shaft, as it curves to form the medial epicondyle, in the region of the proximal tibial metaphysis. Approach: Anterior, ipsilateral approach. Area Prepped: Entire knee area, from mid-thigh  to mid-shin, lateral, anterior, and medial aspects. DuraPrep (Iodine Povacrylex [0.7% available iodine] and Isopropyl Alcohol, 74% w/w) Safety Precautions: Aspiration looking for blood return was conducted prior to all injections. At no point did we inject any substances, as a needle was being advanced. No attempts were  made at seeking any paresthesias. Safe injection practices and needle disposal techniques used. Medications properly checked for expiration dates. SDV (single dose vial) medications used. Description of the Procedure: Protocol guidelines were followed. The patient was placed in position over the procedure table. The target area was identified and the area prepped in the usual manner. The skin and muscle were infiltrated with local anesthetic. Appropriate amount of time allowed to pass for local anesthetics to take effect. Radiofrequency needles were introduced to the target area using fluoroscopic guidance. Using the NeuroTherm NT1100 Radiofrequency Generator, sensory stimulation using 50 Hz was used to locate & identify the nerve, making sure that the needle was positioned such that there was no sensory stimulation below 0.3 V or above 0.7 V. Stimulation using 2 Hz was used to evaluate the motor component. Care was taken not to lesion any nerves that demonstrated motor stimulation of the lower extremities at an output of less than 2.5 times that of the sensory threshold, or a maximum of 2.0 V. Once satisfactory placement of the needles was achieved, the numbing solution was slowly injected after negative aspiration. After waiting for at least 2 minutes, the ablation was performed at 80 degrees C for 60 seconds, using regular Radiofrequency settings. Once the procedure was completed, the needles were then removed and the area cleansed, making sure to leave some of the prepping solution back to take advantage of its long term bactericidal properties. Intra-operative Compliance:  Compliant      Vitals:   01/29/23 0900 01/29/23 0914 01/29/23 0920 01/29/23 0925  BP: 95/84 (!) 122/94 (!) 121/92 (!) 126/95  Pulse:      Resp: (!) 21 14 11 14   Temp:      SpO2: 98% 100% 100% 100%  Weight:      Height:        Start Time: 0913 hrs. End Time: 0918 hrs. Materials & Medications:  Needle(s) Type: Teflon-coated, curved tip, Radiofrequency needle(s) Gauge: 22G Length: 10cm Medication(s): Please see orders for medications and dosing details. 9 cc solution made of 8 cc of 0.2% ropivacaine, 1 cc of Decadron 10 mg cc 3 cc injected each level above prior to lesioning   Imaging Guidance (Non-Spinal):          Type of Imaging Technique: Fluoroscopy Guidance (Non-Spinal) Indication(s): Assistance in needle guidance and placement for procedures requiring needle placement in or near specific anatomical locations not easily accessible without such assistance. Exposure Time: Please see nurses notes. Contrast: Before injecting any contrast, we confirmed that the patient did not have an allergy to iodine, shellfish, or radiological contrast. Once satisfactory needle placement was completed at the desired level, radiological contrast was injected. Contrast injected under live fluoroscopy. No contrast complications. See chart for type and volume of contrast used. Fluoroscopic Guidance: I was personally present during the use of fluoroscopy. "Tunnel Vision Technique" used to obtain the best possible view of the target area. Parallax error corrected before commencing the procedure. "Direction-depth-direction" technique used to introduce the needle under continuous pulsed fluoroscopy. Once target was reached, antero-posterior, oblique, and lateral fluoroscopic projection used confirm needle placement in all planes. Images permanently stored in EMR. Interpretation: I personally interpreted the imaging intraoperatively. Adequate needle placement confirmed in multiple planes. Appropriate spread  of contrast into desired area was observed. No evidence of afferent or efferent intravascular uptake. Permanent images saved into the patient's record.  Antibiotic Prophylaxis:   Anti-infectives (From admission, onward)    None      Indication(s):  None identified  Post-operative Assessment:  Post-procedure Vital Signs:  Pulse/HCG Rate: 8079 Temp:  98.2 F (36.8 C) Resp: 14 BP:  (!) 126/95 SpO2: 100 %  EBL: None  Complications: No immediate post-treatment complications observed by team, or reported by patient.  Note: The patient tolerated the entire procedure well. A repeat set of vitals were taken after the procedure and the patient was kept under observation following institutional policy, for this type of procedure. Post-procedural neurological assessment was performed, showing return to baseline, prior to discharge. The patient was provided with post-procedure discharge instructions, including a section on how to identify potential problems. Should any problems arise concerning this procedure, the patient was given instructions to immediately contact us, at any time, without hesitation. In any case, we plan to contact the patient by telephone for a follow-up status report regarding this interventional procedure.  Comments:  No additional relevant information.  Plan of Care  Orders:  Orders Placed This Encounter  Procedures   DG PAIN CLINIC C-ARM 1-60 MIN NO REPORT    Intraoperative interpretation by procedural physician at [facility].    Standing Status:   Standing    Number of Occurrences:   1    Reason for exam::   Assistance in needle guidance and placement for procedures requiring needle placement in or near specific anatomical locations not easily accessible without such assistance.    Medications ordered for procedure: Meds ordered this encounter  Medications   lidocaine (XYLOCAINE) 2 % (with pres) injection 400 mg   lactated ringers infusion    midazolam (VERSED) injection 0.5-2 mg    Make sure Flumazenil is available in the pyxis when using this medication. If oversedation occurs, administer 0.2 mg IV over 15 sec. If after 45 sec no response, administer 0.2 mg again over 1 min; may repeat at 1 min intervals; not to exceed 4 doses (1 mg)   ropivacaine (PF) 2 mg/mL (0.2%) (NAROPIN) injection 18 mL   dexamethasone (DECADRON) injection 20 mg   ropivacaine (PF) 2 mg/mL (0.2%) (NAROPIN) injection 9 mL   DISCONTD: dexamethasone (DECADRON) injection 10 mg   Medications administered: We administered lidocaine, midazolam, ropivacaine (PF) 2 mg/mL (0.2%), and dexamethasone.  See the medical record for exact dosing, route, and time of administration.  Follow-up plan:   Return in about 10 weeks (around 04/09/2023) for PPE, F2F.       Left knee genicular nerve block 10/13/20, bilateral GNB 01/17/21     Recent Visits Date Type Provider Dept  01/02/23 Office Visit Edward Jolly, MD [facility]  11/07/22 Office Visit Edward Jolly, MD [facility]  Showing recent visits within past 90 days and meeting all other requirements Today's Visits Date Type Provider Dept  01/29/23 Procedure visit Edward Jolly, MD [facility]  Showing today's visits and meeting all other requirements Future Appointments Date Type Provider Dept  04/09/23 Appointment Edward Jolly, MD [facility]  Showing future appointments within next 90 days and meeting all other requirements  Disposition: Discharge home  Discharge (Date  Time): 01/29/2023; 0935 hrs.   Primary Care Physician: Nira Retort Location: [facility] Note by: Edward Jolly, MD Date: 01/29/2023; Time: 9:41 AM  Disclaimer:  Medicine is not an exact science. The only guarantee in medicine is that nothing is guaranteed. It is important to note that the decision to proceed with this intervention was based on the information  collected from the patient. The Data and conclusions were drawn  from the patient's questionnaire, the interview, and the physical examination. Because the information was provided in large part by the patient, it cannot be guaranteed that it has not been purposely or unconsciously manipulated. Every effort has been made to obtain as much relevant data as possible for this evaluation. It is important to note that the conclusions that lead to this procedure are derived in large part from the available data. Always take into account that the treatment will also be dependent on availability of resources and existing treatment guidelines, considered by other Pain Management Practitioners as being common knowledge and practice, at the time of the intervention. For Medico-Legal purposes, it is also important to point out that variation in procedural techniques and pharmacological choices are the acceptable norm. The indications, contraindications, technique, and results of the above procedure should only be interpreted and judged by a Board-Certified Interventional Pain Specialist with extensive familiarity and expertise in the same exact procedure and technique.

## 2023-01-29 NOTE — Progress Notes (Signed)
PROVIDER NOTE: Interpretation of information contained herein should be left to medically-trained personnel. Specific patient instructions are provided elsewhere under "Patient Instructions" section of medical record. This document was created in part using STT-dictation technology, any transcriptional errors that may result from this process are unintentional.  Patient: Anna Barker [place] Type: Established DOB: 04/22/1980 MRN: [id number] PCP: Nira Retort  Service: Procedure DOS: 01/29/2023 Setting: Ambulatory Location: Ambulatory outpatient facility Delivery: Face-to-face Provider: Edward Jolly, MD Specialty: Interventional Pain Management Specialty designation: 09 Location: Outpatient facility Ref. Prov.: Edward Jolly, MD       Interventional Therapy   Type: Lumbar Facet, Medial Branch Block(s) (w/ fluoroscopic mapping) #1  Laterality: Right  Level: L3, L4, and L5 Medial Branch Level(s). Injecting these levels blocks the L3-4 and L4-5 lumbar facet joints. Imaging: Fluoroscopic guidance         Anesthesia: Local anesthesia (1-2% Lidocaine) DOS: 01/29/2023 Performed by: Edward Jolly, MD  Primary Purpose: Diagnostic/Therapeutic Indications: Low back pain severe enough to impact quality of life or function. Lumbar facet joint syndrome  NAS-11 Pain score:   Pre-procedure: 5 /10   Post-procedure:  (2-back, 0-knee)/10     Position / Prep / Materials:  Position: Prone  Prep solution: ChloraPrep (2% chlorhexidine gluconate and 70% isopropyl alcohol) Area Prepped: Posterolateral Lumbosacral Spine (Wide prep: From the lower border of the scapula down to the end of the tailbone and from flank to flank.)  Materials:  Tray: Block Needle(s):  Type: Spinal  Gauge (G): 22  Length: 5-in Qty: 2      H&P (Pre-op Assessment):  [name] is a 42 y.o. (year old), female patient, seen today for interventional treatment. 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 [name] has a current medication list which includes the following prescription(s): amlodipine, omeprazole, tizanidine, hydrochlorothiazide, mobic, and prednisone, and the following Facility-Administered Medications: lactated ringers and ropivacaine (pf) 2 mg/ml (0.2%). Her primarily concern today is the Back Pain (lower) and Knee Pain (right)  Initial Vital Signs:  Pulse/HCG Rate: 80ECG Heart Rate: 81 Temp: 98.2 F (36.8 C) Resp: 16 BP: (!) 127/93 SpO2: 100 %  BMI: Estimated body mass index is 47.93 kg/m as calculated from the following:   Height as of this encounter: 5\' 7"  (1.702 m).   Weight as of this encounter: 306 lb (138.8 kg).  Risk Assessment: Allergies: Reviewed. She has no known allergies.  Allergy Precautions: None required Coagulopathies: Reviewed. None identified.  Blood-thinner therapy: None at this time Active Infection(s): Reviewed. None identified. [name] is afebrile  Site Confirmation: [name] was asked to confirm the procedure and laterality before marking the site Procedure checklist: Completed Consent: Before the procedure and under the influence of no sedative(s), amnesic(s), or anxiolytics, the patient was informed of the treatment options, risks and possible complications. To fulfill our ethical and legal obligations, as recommended by the American Medical Association's Code of Ethics, I have informed the patient of my clinical impression; the nature and purpose of the treatment or procedure; the risks, benefits, and possible complications of the intervention; the alternatives, including doing nothing; the risk(s) and benefit(s) of the alternative treatment(s) or procedure(s); and the risk(s) and benefit(s) of doing nothing. The patient was provided information about the general risks and possible complications associated with the procedure. These may include, but are  not limited to: failure to achieve desired goals, infection, bleeding, organ or nerve damage, allergic reactions, paralysis, and death.  In addition, the patient was informed of those risks and complications associated to Spine-related procedures, such as failure to decrease pain; infection (i.e.: Meningitis, epidural or intraspinal abscess); bleeding (i.e.: epidural hematoma, subarachnoid hemorrhage, or any other type of intraspinal or peri-dural bleeding); organ or nerve damage (i.e.: Any type of peripheral nerve, nerve root, or spinal cord injury) with subsequent damage to sensory, motor, and/or autonomic systems, resulting in permanent pain, numbness, and/or weakness of one or several areas of the body; allergic reactions; (i.e.: anaphylactic reaction); and/or death. Furthermore, the patient was informed of those risks and complications associated with the medications. These include, but are not limited to: allergic reactions (i.e.: anaphylactic or anaphylactoid reaction(s)); adrenal axis suppression; blood sugar elevation that in diabetics may result in ketoacidosis or comma; water retention that in patients with history of congestive heart failure may result in shortness of breath, pulmonary edema, and decompensation with resultant heart failure; weight gain; swelling or edema; medication-induced neural toxicity; particulate matter embolism and blood vessel occlusion with resultant organ, and/or nervous system infarction; and/or aseptic necrosis of one or more joints. Finally, the patient was informed that Medicine is not an exact science; therefore, there is also the possibility of unforeseen or unpredictable risks and/or possible complications that may result in a catastrophic outcome. The patient indicated having understood very clearly. We have given the patient no guarantees and we have made no promises. Enough time was given to the patient to ask questions, all of which were answered to the patient's  satisfaction. [name] has indicated that she wanted to continue with the procedure. Attestation: I, the ordering provider, attest that I have discussed with the patient the benefits, risks, side-effects, alternatives, likelihood of achieving goals, and potential problems during recovery for the procedure that I have provided informed consent. Date  Time: 01/29/2023  8:03 AM   Pre-Procedure Preparation:  Monitoring: As per clinic protocol. Respiration, ETCO2, SpO2, BP, heart rate and rhythm monitor placed and checked for adequate function Safety Precautions: Patient was assessed for positional comfort and pressure points before starting the procedure. Time-out: I initiated and conducted the "Time-out" before starting the procedure, as per protocol. The patient was asked to participate by confirming the accuracy of the "Time Out" information. Verification of the correct person, site, and procedure were performed and confirmed by me, the nursing staff, and the patient. "Time-out" conducted as per Joint Commission's Universal Protocol (UP.01.01.01). Time: 0913 Start Time: 0913 hrs.  Description of Procedure:          Laterality: (see above) Targeted Levels: (see above)  Safety Precautions: Aspiration looking for blood return was conducted prior to all injections. At no point did we inject any substances, as a needle was being advanced. Before injecting, the patient was told to immediately notify me if she was experiencing any new onset of "ringing in the ears, or metallic taste in the mouth". No attempts were made at seeking any paresthesias. Safe injection practices and needle disposal techniques used. Medications properly checked for expiration dates. SDV (single dose vial) medications used. After the completion of the procedure, all disposable equipment used was discarded in the proper designated medical waste containers. Local Anesthesia: Protocol guidelines were followed. The patient was  positioned over the fluoroscopy table. The area was prepped in the usual manner. The time-out was completed. The target area was identified using fluoroscopy. A 12-in long, straight, sterile hemostat was used with fluoroscopic guidance to locate the targets for each level blocked. Once located, the skin  was marked with an approved surgical skin marker. Once all sites were marked, the skin (epidermis, dermis, and hypodermis), as well as deeper tissues (fat, connective tissue and muscle) were infiltrated with a small amount of a short-acting local anesthetic, loaded on a 10cc syringe with a 25G, 1.5-in  Needle. An appropriate amount of time was allowed for local anesthetics to take effect before proceeding to the next step. Local Anesthetic: Lidocaine 2.0% The unused portion of the local anesthetic was discarded in the proper designated containers. Technical description of process:    L3 Medial Branch Nerve Block (MBB): The target area for the L3 medial branch is at the junction of the postero-lateral aspect of the superior articular process and the superior, posterior, and medial edge of the transverse process of L4. Under fluoroscopic guidance, a Quincke needle was inserted until contact was made with os over the superior postero-lateral aspect of the pedicular shadow (target area). After negative aspiration for blood, 2mL of the nerve block solution was injected without difficulty or complication. The needle was removed intact. L4 Medial Branch Nerve Block (MBB): The target area for the L4 medial branch is at the junction of the postero-lateral aspect of the superior articular process and the superior, posterior, and medial edge of the transverse process of L5. Under fluoroscopic guidance, a Quincke needle was inserted until contact was made with os over the superior postero-lateral aspect of the pedicular shadow (target area). After negative aspiration for blood, 2mL of the nerve block solution was injected  without difficulty or complication. The needle was removed intact. L5 Medial Branch Nerve Block (MBB): The target area for the L5 medial branch is at the junction of the postero-lateral aspect of the superior articular process and the superior, posterior, and medial edge of the sacral ala. Under fluoroscopic guidance, a Quincke needle was inserted until contact was made with os over the superior postero-lateral aspect of the pedicular shadow (target area). After negative aspiration for blood, 2mL of the nerve block solution was injected without difficulty or complication. The needle was removed intact.   Once the entire procedure was completed, the treated area was cleaned, making sure to leave some of the prepping solution back to take advantage of its long term bactericidal properties.         Illustration of the posterior view of the lumbar spine and the posterior neural structures. Laminae of L2 through S1 are labeled. DPRL5, dorsal primary ramus of L5; DPRS1, dorsal primary ramus of S1; DPR3, dorsal primary ramus of L3; FJ, facet (zygapophyseal) joint L3-L4; I, inferior articular process of L4; LB1, lateral branch of dorsal primary ramus of L1; IAB, inferior articular branches from L3 medial branch (supplies L4-L5 facet joint); IBP, intermediate branch plexus; MB3, medial branch of dorsal primary ramus of L3; NR3, third lumbar nerve root; S, superior articular process of L5; SAB, superior articular branches from L4 (supplies L4-5 facet joint also); TP3, transverse process of L3.   Facet Joint Innervation (* possible contribution)  L1-2 T12, L1 (L2*)  Medial Branch  L2-3 L1, L2 (L3*)         "          "  L3-4 L2, L3 (L4*)         "          "  L4-5 L3, L4 (L5*)         "          "  L5-S1 L4, L5, S1          "          "  Vitals:   01/29/23 0900 01/29/23 0914 01/29/23 0920 01/29/23 0925  BP: 95/84 (!) 122/94 (!) 121/92 (!) 126/95  Pulse:      Resp: (!) 21 14 11 14   Temp:      SpO2:  98% 100% 100% 100%  Weight:      Height:         End Time: 0918 hrs.  Imaging Guidance (Spinal):          Type of Imaging Technique: Fluoroscopy Guidance (Spinal) Indication(s): Fluoroscopy guidance for needle placement to enhance accuracy in procedures requiring precise needle localization for targeted delivery of medication in or near specific anatomical locations not easily accessible without such real-time imaging assistance. Exposure Time: Please see nurses notes. Contrast: None used. Fluoroscopic Guidance: I was personally present during the use of fluoroscopy. "Tunnel Vision Technique" used to obtain the best possible view of the target area. Parallax error corrected before commencing the procedure. "Direction-depth-direction" technique used to introduce the needle under continuous pulsed fluoroscopy. Once target was reached, antero-posterior, oblique, and lateral fluoroscopic projection used confirm needle placement in all planes. Images permanently stored in EMR. Interpretation: No contrast injected. I personally interpreted the imaging intraoperatively. Adequate needle placement confirmed in multiple planes. Permanent images saved into the patient's record.  Post-operative Assessment:  Post-procedure Vital Signs:  Pulse/HCG Rate: 8079 Temp: 98.2 F (36.8 C) Resp: 14 BP: (!) 126/95 SpO2: 100 %  EBL: None  Complications: No immediate post-treatment complications observed by team, or reported by patient.  Note: The patient tolerated the entire procedure well. A repeat set of vitals were taken after the procedure and the patient was kept under observation following institutional policy, for this type of procedure. Post-procedural neurological assessment was performed, showing return to baseline, prior to discharge. The patient was provided with post-procedure discharge instructions, including a section on how to identify potential problems. Should any problems arise concerning this  procedure, the patient was given instructions to immediately contact us, at any time, without hesitation. In any case, we plan to contact the patient by telephone for a follow-up status report regarding this interventional procedure.  Comments:  No additional relevant information.  Plan of Care (POC)  Orders:  Orders Placed This Encounter  Procedures   DG PAIN [facility] C-ARM 1-60 MIN NO REPORT    Intraoperative interpretation by procedural physician at [facility].    Standing Status:   Standing    Number of Occurrences:   1    Reason for exam::   Assistance in needle guidance and placement for procedures requiring needle placement in or near specific anatomical locations not easily accessible without such assistance.    Medications ordered for procedure: Meds ordered this encounter  Medications   lidocaine (XYLOCAINE) 2 % (with pres) injection 400 mg   lactated ringers infusion   midazolam (VERSED) injection 0.5-2 mg    Make sure Flumazenil is available in the pyxis when using this medication. If oversedation occurs, administer 0.2 mg IV over 15 sec. If after 45 sec no response, administer 0.2 mg again over 1 min; may repeat at 1 min intervals; not to exceed 4 doses (1 mg)   ropivacaine (PF) 2 mg/mL (0.2%) (NAROPIN) injection 18 mL   dexamethasone (DECADRON) injection 20 mg   ropivacaine (PF) 2 mg/mL (0.2%) (NAROPIN) injection 9 mL   DISCONTD: dexamethasone (DECADRON) injection 10 mg   Medications administered: We administered lidocaine, midazolam, ropivacaine (PF) 2 mg/mL (0.2%), and dexamethasone.  See the medical record for exact  dosing, route, and time of administration.  Follow-up plan:   Return in about 10 weeks (around 04/09/2023) for PPE, F2F.       Recent Visits Date Type Provider Dept  01/02/23 Office Visit Edward Jolly, MD [facility]  11/07/22 Office Visit Edward Jolly, MD [facility]  Showing recent visits within past 90 days and  meeting all other requirements Today's Visits Date Type Provider Dept  01/29/23 Procedure visit Edward Jolly, MD [facility]  Showing today's visits and meeting all other requirements Future Appointments Date Type Provider Dept  04/09/23 Appointment Edward Jolly, MD [facility]  Showing future appointments within next 90 days and meeting all other requirements  Disposition: Discharge home  Discharge (Date  Time): 01/29/2023; 0935 hrs.   Primary Care Physician: Nira Retort Location: [facility] Note by: Edward Jolly, MD (TTS technology used. I apologize for any typographical errors that were not detected and corrected.) Date: 01/29/2023; Time: 9:42 AM  Disclaimer:  Medicine is not an Visual merchandiser. The only guarantee in medicine is that nothing is guaranteed. It is important to note that the decision to proceed with this intervention was based on the information collected from the patient. The Data and conclusions were drawn from the patient's questionnaire, the interview, and the physical examination. Because the information was provided in large part by the patient, it cannot be guaranteed that it has not been purposely or unconsciously manipulated. Every effort has been made to obtain as much relevant data as possible for this evaluation. It is important to note that the conclusions that lead to this procedure are derived in large part from the available data. Always take into account that the treatment will also be dependent on availability of resources and existing treatment guidelines, considered by other Pain Management Practitioners as being common knowledge and practice, at the time of the intervention. For Medico-Legal purposes, it is also important to point out that variation in procedural techniques and pharmacological choices are the acceptable norm. The indications, contraindications, technique, and results of the above  procedure should only be interpreted and judged by a Board-Certified Interventional Pain Specialist with extensive familiarity and expertise in the same exact procedure and technique.

## 2023-01-29 NOTE — Patient Instructions (Signed)

## 2023-01-29 NOTE — Progress Notes (Signed)
Safety precautions to be maintained throughout the outpatient stay will include: orient to surroundings, keep bed in low position, maintain call bell within reach at all times, provide assistance with transfer out of bed and ambulation.  

## 2023-01-30 ENCOUNTER — Telehealth: Payer: Self-pay

## 2023-01-30 NOTE — Telephone Encounter (Signed)
Called PP. Denies any needs at this time. Instructed to call back if needed.

## 2023-02-15 ENCOUNTER — Telehealth: Payer: Self-pay | Admitting: Student in an Organized Health Care Education/Training Program

## 2023-02-15 NOTE — Telephone Encounter (Signed)
 PT stated that she hasn't have any changes in her back since the procedure that she had done in Dec. 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 Please give patient a call. TY

## 2023-03-08 ENCOUNTER — Encounter: Payer: Self-pay | Admitting: Student in an Organized Health Care Education/Training Program

## 2023-03-08 ENCOUNTER — Ambulatory Visit
Payer: Medicare Other | Attending: Student in an Organized Health Care Education/Training Program | Admitting: Student in an Organized Health Care Education/Training Program

## 2023-03-08 VITALS — BP 129/100 | HR 104 | Temp 97.8°F | Resp 16 | Ht 67.0 in | Wt 300.0 lb

## 2023-03-08 DIAGNOSIS — M25561 Pain in right knee: Secondary | ICD-10-CM | POA: Diagnosis not present

## 2023-03-08 DIAGNOSIS — M25562 Pain in left knee: Secondary | ICD-10-CM | POA: Insufficient documentation

## 2023-03-08 DIAGNOSIS — M1711 Unilateral primary osteoarthritis, right knee: Secondary | ICD-10-CM | POA: Insufficient documentation

## 2023-03-08 DIAGNOSIS — F1290 Cannabis use, unspecified, uncomplicated: Secondary | ICD-10-CM | POA: Insufficient documentation

## 2023-03-08 DIAGNOSIS — G8929 Other chronic pain: Secondary | ICD-10-CM | POA: Insufficient documentation

## 2023-03-08 DIAGNOSIS — M546 Pain in thoracic spine: Secondary | ICD-10-CM | POA: Diagnosis not present

## 2023-03-08 DIAGNOSIS — Z87891 Personal history of nicotine dependence: Secondary | ICD-10-CM | POA: Insufficient documentation

## 2023-03-08 DIAGNOSIS — M545 Low back pain, unspecified: Secondary | ICD-10-CM | POA: Diagnosis not present

## 2023-03-08 DIAGNOSIS — G4700 Insomnia, unspecified: Secondary | ICD-10-CM | POA: Insufficient documentation

## 2023-03-08 DIAGNOSIS — Z79899 Other long term (current) drug therapy: Secondary | ICD-10-CM | POA: Diagnosis not present

## 2023-03-08 DIAGNOSIS — Z91410 Personal history of adult physical and sexual abuse: Secondary | ICD-10-CM | POA: Diagnosis not present

## 2023-03-08 DIAGNOSIS — M47816 Spondylosis without myelopathy or radiculopathy, lumbar region: Secondary | ICD-10-CM | POA: Diagnosis present

## 2023-03-08 DIAGNOSIS — Z91411 Personal history of adult psychological abuse: Secondary | ICD-10-CM | POA: Diagnosis not present

## 2023-03-08 MED ORDER — DICLOFENAC SODIUM 75 MG PO TBEC: 75.0000 mg | DELAYED_RELEASE_TABLET | Freq: Two times a day (BID) | ORAL | 1 refills | Status: DC

## 2023-03-08 MED ORDER — GABAPENTIN 300 MG PO CAPS: ORAL_CAPSULE | ORAL | 0 refills | Status: DC

## 2023-03-08 NOTE — Progress Notes (Signed)
Safety precautions to be maintained throughout the outpatient stay will include: orient to surroundings, keep bed in low position, maintain call bell within reach at all times, provide assistance with transfer out of bed and ambulation.
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
## 2023-03-08 NOTE — Patient Instructions (Signed)

## 2023-03-17 ENCOUNTER — Encounter: Payer: Self-pay | Admitting: Student in an Organized Health Care Education/Training Program

## 2023-03-19 NOTE — Telephone Encounter (Signed)
 She began Diclofenac  and Gabapentin  on 03-08-23.

## 2023-04-09 ENCOUNTER — Ambulatory Visit: Payer: Medicare Other | Admitting: Student in an Organized Health Care Education/Training Program

## 2023-04-11 ENCOUNTER — Encounter: Payer: Self-pay | Admitting: Student in an Organized Health Care Education/Training Program

## 2023-04-11 ENCOUNTER — Ambulatory Visit
Payer: Medicare Other | Attending: Student in an Organized Health Care Education/Training Program | Admitting: Student in an Organized Health Care Education/Training Program

## 2023-04-11 ENCOUNTER — Telehealth: Payer: Self-pay | Admitting: Student in an Organized Health Care Education/Training Program

## 2023-04-11 VITALS — BP 122/91 | HR 103 | Temp 98.1°F | Resp 16 | Ht 67.0 in | Wt 316.0 lb

## 2023-04-11 DIAGNOSIS — M461 Sacroiliitis, not elsewhere classified: Secondary | ICD-10-CM | POA: Diagnosis not present

## 2023-04-11 DIAGNOSIS — M545 Low back pain, unspecified: Secondary | ICD-10-CM | POA: Insufficient documentation

## 2023-04-11 DIAGNOSIS — F4323 Adjustment disorder with mixed anxiety and depressed mood: Secondary | ICD-10-CM | POA: Diagnosis not present

## 2023-04-11 DIAGNOSIS — M25571 Pain in right ankle and joints of right foot: Secondary | ICD-10-CM | POA: Insufficient documentation

## 2023-04-11 DIAGNOSIS — M533 Sacrococcygeal disorders, not elsewhere classified: Secondary | ICD-10-CM | POA: Insufficient documentation

## 2023-04-11 DIAGNOSIS — Z91410 Personal history of adult physical and sexual abuse: Secondary | ICD-10-CM | POA: Diagnosis not present

## 2023-04-11 DIAGNOSIS — M1712 Unilateral primary osteoarthritis, left knee: Secondary | ICD-10-CM | POA: Diagnosis not present

## 2023-04-11 DIAGNOSIS — M25561 Pain in right knee: Secondary | ICD-10-CM | POA: Diagnosis not present

## 2023-04-11 DIAGNOSIS — G5701 Lesion of sciatic nerve, right lower limb: Secondary | ICD-10-CM | POA: Diagnosis not present

## 2023-04-11 MED ORDER — METHOCARBAMOL 1000 MG/10ML IJ SOLN
200.0000 mg | Freq: Once | INTRAMUSCULAR | Status: AC
Start: 2023-04-11 — End: 2023-04-11
  Administered 2023-04-11: 200 mg via INTRAMUSCULAR
  Filled 2023-04-11: qty 10

## 2023-04-11 MED ORDER — KETOROLAC TROMETHAMINE 30 MG/ML IJ SOLN
30.0000 mg | Freq: Once | INTRAMUSCULAR | Status: AC
  Administered 2023-04-11: 30 mg via INTRAMUSCULAR
  Filled 2023-04-11: qty 1

## 2023-04-11 MED ORDER — CYCLOBENZAPRINE HCL 5 MG PO TABS: 5.0000 mg | ORAL_TABLET | Freq: Three times a day (TID) | ORAL | 0 refills | Status: DC | PRN
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
## 2023-04-11 NOTE — Progress Notes (Signed)
 Safety precautions to be maintained throughout the outpatient stay will include: orient to surroundings, keep bed in low position, maintain call bell within reach at all times, provide assistance with transfer out of bed and ambulation.

## 2023-04-11 NOTE — Patient Instructions (Signed)
 ______________________________________________________________________    Preparing for your procedure  Appointments: If you think you may not be able to keep your appointment, call 24-48 hours in advance to cancel. We need time to make it available to others.  Procedure visits are for procedures only. During your procedure appointment there will be: NO Prescription Refills*. NO medication changes or discussions*. NO discussion of disability issues*. NO unrelated pain problem evaluations*. NO evaluations to order other pain procedures*. *These will be addressed at a separate and distinct evaluation encounter on the provider's evaluation schedule and not during procedure days.  Instructions: Food intake: Avoid eating anything solid for at least 8 hours prior to your procedure. Clear liquid intake: You may take clear liquids such as water up to 2 hours prior to your procedure. (No carbonated drinks. No soda.) Transportation: Unless otherwise stated by your physician, bring a driver. (Driver cannot be a Market researcher, Pharmacist, community, or any other form of public transportation.) Morning Medicines: Except for blood thinners, take all of your other morning medications with a sip of water. Make sure to take your heart and blood pressure medicines. If your blood pressure's lower number is above 100, the case will be rescheduled. Blood thinners: Make sure to stop your blood thinners as instructed.  If you take a blood thinner, but were not instructed to stop it, call our office [phone] and ask to talk to a nurse. Not stopping a blood thinner prior to certain procedures could lead to serious complications. Diabetics on insulin: Notify the staff so that you can be scheduled 1st case in the morning. If your diabetes requires high dose insulin, take only  of your normal insulin dose the morning of the procedure and notify the staff that you have done so. Preventing infections: Shower with an antibacterial soap the  morning of your procedure.  Build-up your immune system: Take 1000 mg of Vitamin C with every meal (3 times a day) the day prior to your procedure. Antibiotics: Inform the nursing staff if you are taking any antibiotics or if you have any conditions that may require antibiotics prior to procedures. (Example: recent joint implants)   Pregnancy: If you are pregnant make sure to notify the nursing staff. Not doing so may result in injury to the fetus, including death.  Sickness: If you have a cold, fever, or any active infections, call and cancel or reschedule your procedure. Receiving steroids while having an infection may result in complications. Arrival: You must be in the facility at least 30 minutes prior to your scheduled procedure. Tardiness: Your scheduled time is also the cutoff time. If you do not arrive at least 15 minutes prior to your procedure, you will be rescheduled.  Children: Do not bring any children with you. Make arrangements to keep them home. Dress appropriately: There is always a possibility that your clothing may get soiled. Avoid long dresses. Valuables: Do not bring any jewelry or valuables.  Reasons to call and reschedule or cancel your procedure: (Following these recommendations will minimize the risk of a serious complication.) Surgeries: Avoid having procedures within 2 weeks of any surgery. (Avoid for 2 weeks before or after any surgery). Flu Shots: Avoid having procedures within 2 weeks of a flu shots or . (Avoid for 2 weeks before or after immunizations). Barium: Avoid having a procedure within 7-10 days after having had a radiological study involving the use of radiological contrast. (Myelograms, Barium swallow or enema study). Heart attacks: Avoid any elective procedures or surgeries for the  initial 6 months after a "Myocardial Infarction" (Heart Attack). Blood thinners: It is imperative that you stop these medications before procedures. Let us know if you if you take  any blood thinner.  Infection: Avoid procedures during or within two weeks of an infection (including chest colds or gastrointestinal problems). Symptoms associated with infections include: Localized redness, fever, chills, night sweats or profuse sweating, burning sensation when voiding, cough, congestion, stuffiness, runny nose, sore throat, diarrhea, nausea, vomiting, cold or Flu symptoms, recent or current infections. It is specially important if the infection is over the area that we intend to treat. Heart and lung problems: Symptoms that may suggest an active cardiopulmonary problem include: cough, chest pain, breathing difficulties or shortness of breath, dizziness, ankle swelling, uncontrolled high or unusually low blood pressure, and/or palpitations. If you are experiencing any of these symptoms, cancel your procedure and contact your primary care physician for an evaluation.  Remember:  Regular Business hours are:  Monday to Thursday 8:00 AM to 4:00 PM  Provider's Schedule: Delano Metz, MD:  Procedure days: Tuesday and Thursday 7:30 AM to 4:00 PM  Edward Jolly, MD:  Procedure days: Monday and Wednesday 7:30 AM to 4:00 PM Last  Updated: 01/16/2023 ______________________________________________________________________    Sacroiliac (SI) Joint Injection Patient Information  Description: The sacroiliac joint connects the scrum (very low back and tailbone) to the ilium (a pelvic bone which also forms half of the hip joint).  Normally this joint experiences very little motion.  When this joint becomes inflamed or unstable low back and or hip and pelvis pain may result.  Injection of this joint with local anesthetics (numbing medicines) and steroids can provide diagnostic information and reduce pain.  This injection is performed with the aid of x-ray guidance into the tailbone area while you are lying on your stomach.   You may experience an electrical sensation down the leg while this  is being done.  You may also experience numbness.  We also may ask if we are reproducing your normal pain during the injection.  Conditions which may be treated SI injection:  Low back, buttock, hip or leg pain  Preparation for the Injection:  Do not eat any solid food or dairy products within 8 hours of your appointment.  You may drink clear liquids up to 3 hours before appointment.  Clear liquids include water, black coffee, juice or soda.  No milk or cream please. You may take your regular medications, including pain medications with a sip of water before your appointment.  Diabetics should hold regular insulin (if take separately) and take 1/2 normal NPH dose the morning of the procedure.  Carry some sugar containing items with you to your appointment. A driver must accompany you and be prepared to drive you home after your procedure. Bring all of your current medications with you. An IV may be inserted and sedation may be given at the discretion of the physician. A blood pressure cuff, EKG and other monitors will often be applied during the procedure.  Some patients may need to have extra oxygen administered for a short period.  You will be asked to provide medical information, including your allergies, prior to the procedure.  We must know immediately if you are taking blood thinners (like Coumadin/Warfarin) or if you are allergic to IV iodine contrast (dye).  We must know if you could possible be pregnant.  Possible side effects:  Bleeding from needle site Infection (rare, may require surgery) Nerve injury (rare) Numbness &  tingling (temporary) A brief convulsion or seizure Light-headedness (temporary) Pain at injection site (several days) Decreased blood pressure (temporary) Weakness in the leg (temporary)   Call if you experience:  New onset weakness or numbness of an extremity below the injection site that last more than 8 hours. Hives or difficulty breathing ( go to the  emergency room) Inflammation or drainage at the injection site Any new symptoms which are concerning to you  Please note:  Although the local anesthetic injected can often make your back/ hip/ buttock/ leg feel good for several hours after the injections, the pain will likely return.  It takes 3-7 days for steroids to work in the sacroiliac area.  You may not notice any pain relief for at least that one week.  If effective, we will often do a series of three injections spaced 3-6 weeks apart to maximally decrease your pain.  After the initial series, we generally will wait some months before a repeat injection of the same type.  If you have any questions, please call [phone] [facility]

## 2023-04-11 NOTE — Telephone Encounter (Signed)
 Called patient and told her that Dr Cherylann Ratel will send Flexerill to her pharmacy but she will not need to start taking it for 48 hours.( Per verbal order). Patient with understanding.

## 2023-04-11 NOTE — Telephone Encounter (Signed)
 Patient states DR Cherylann Ratel told her he would send in some Flexiril. She is calling about the script ?

## 2023-05-14 IMAGING — CR DG BONE LENGTH
1 series · 4 of 4 positions shown · non-contrast
Comparison: Left ankle radiograph 09/29/2019, left knee radiograph
01/04/2019, right knee radiograph 12/16/2017

CLINICAL DATA: Chronic pain of both knees

EXAM:
BONE LENGTH

[Series 1: long bone ap · 0.14mm/px · 4 of 4 slices shown]
[im 1/4]
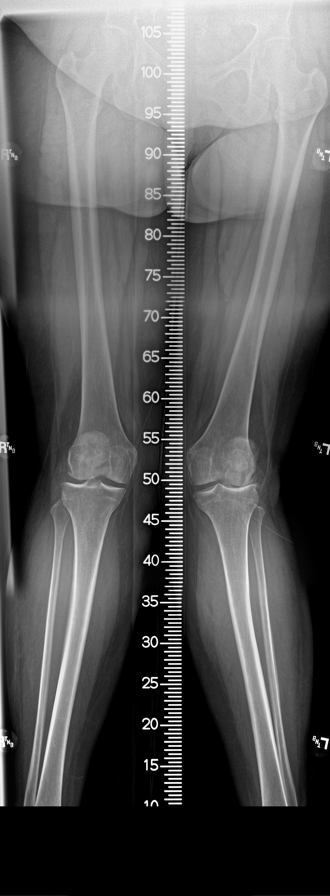
[im 2/4]
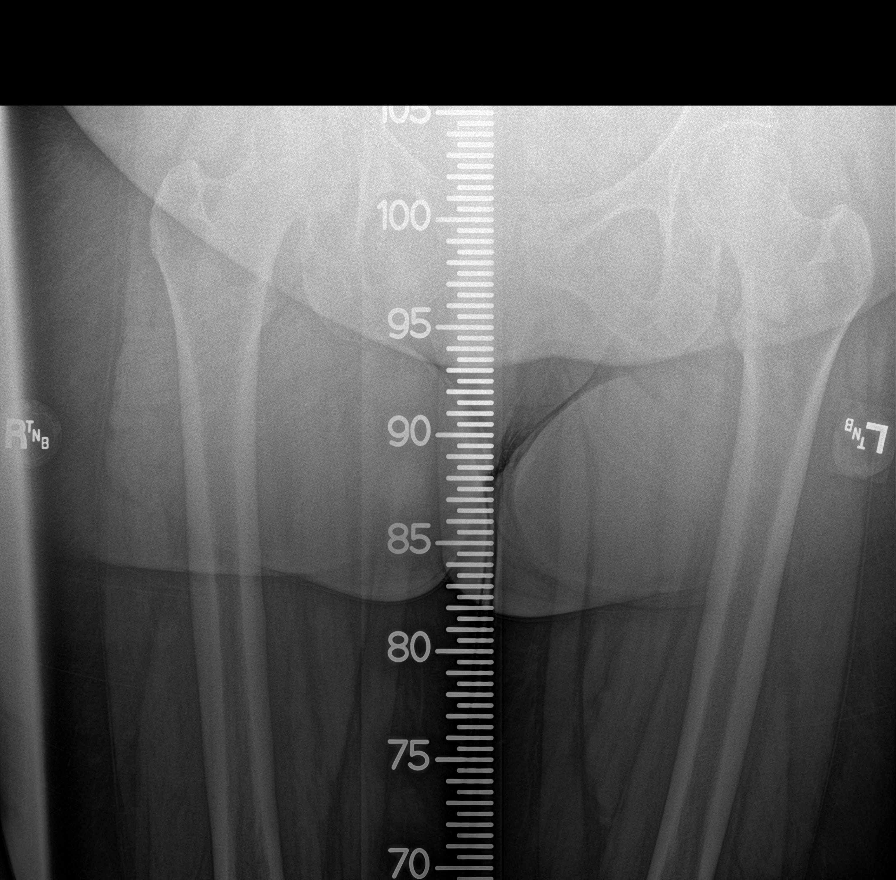
[im 3/4]
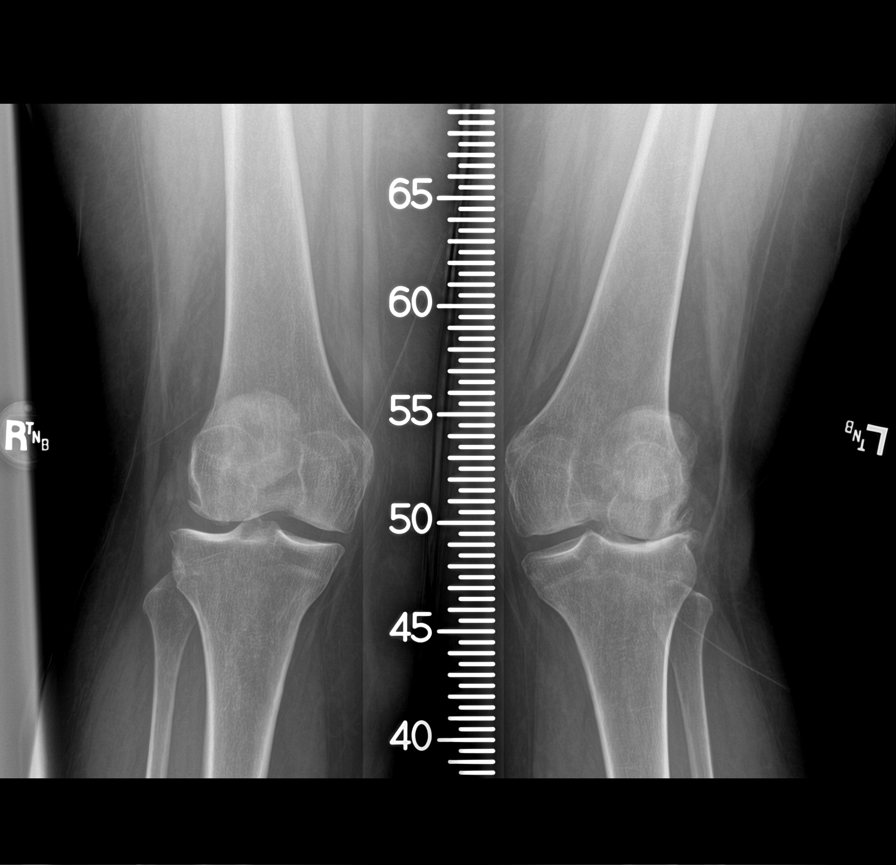
[im 4/4]
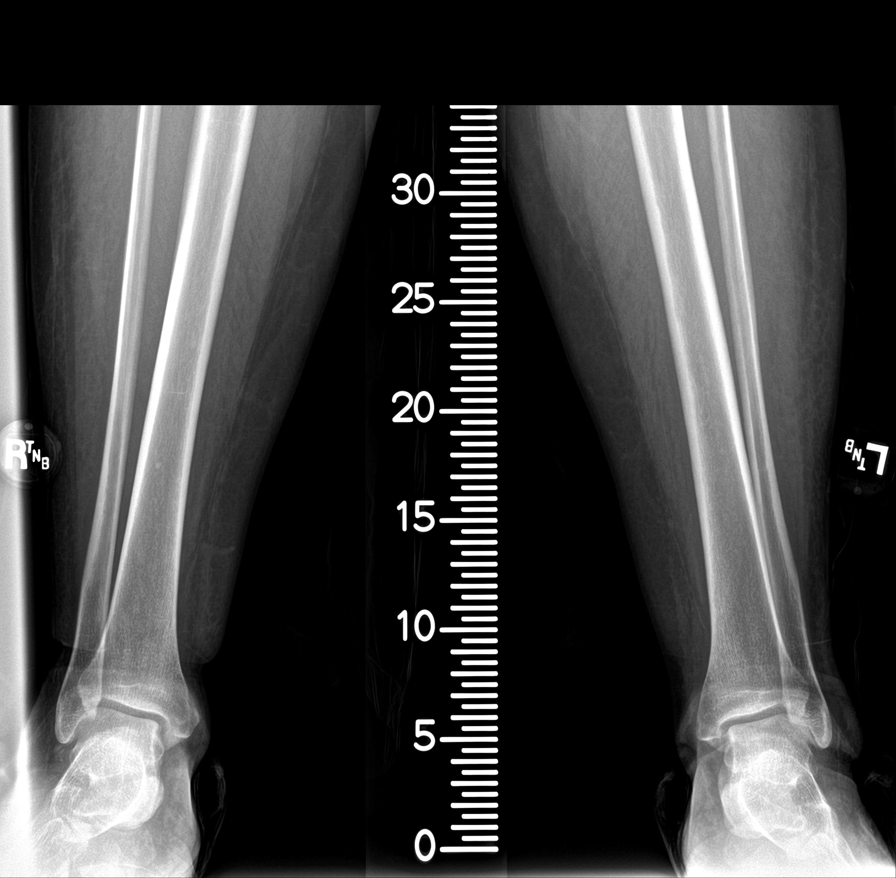

[4 of 4 positions shown; findings below may reference images not displayed]

FINDINGS: There is significant valgus alignment of the knees bilaterally. The
center line on the left passes approximately and 7.2 cm lateral to
the intercondylar eminence in the central line on the left passes
2.2 cm lateral to the intercondylar eminence. There is lateral
compartment osteoarthritis bilateral with osteophyte formation and
joint space narrowing, moderate-severe on the left and mild on the
right.

Left femur length is approximately 55.5 cm. Left tibial length
approximately 45.3 cm.

Right femoral length 56.2 cm. Right tibial length approximately
cm.

There is a left downward pelvic tilt of approximately 1.5 cm.

There is no evidence of acute fracture.
IMPRESSION: Valgus alignment of the knees bilaterally, left worse than right.
Moderate-severe left and mild right lateral compartment predominant
osteoarthritis of the knees.

Limb length discrepancy of 0.7 cm, right greater than left due to
slightly greater length of the right femur.

Left downward pelvic tilt of approximately 1.5 cm.

## 2023-05-16 ENCOUNTER — Ambulatory Visit: Admitting: Student in an Organized Health Care Education/Training Program

## 2023-05-21 ENCOUNTER — Ambulatory Visit
Attending: Student in an Organized Health Care Education/Training Program | Admitting: Student in an Organized Health Care Education/Training Program

## 2023-05-21 ENCOUNTER — Encounter: Payer: Self-pay | Admitting: Student in an Organized Health Care Education/Training Program

## 2023-05-21 ENCOUNTER — Ambulatory Visit
Admission: RE | Admit: 2023-05-21 | Discharge: 2023-05-21 | Disposition: A | Discharge status: Home / Self Care | Source: Ambulatory Visit | Attending: Student in an Organized Health Care Education/Training Program | Admitting: Student in an Organized Health Care Education/Training Program

## 2023-05-21 VITALS — BP 145/83 | HR 98 | Temp 98.4°F | Resp 17 | Ht 67.0 in | Wt 316.0 lb

## 2023-05-21 DIAGNOSIS — M545 Low back pain, unspecified: Secondary | ICD-10-CM | POA: Diagnosis not present

## 2023-05-21 DIAGNOSIS — M533 Sacrococcygeal disorders, not elsewhere classified: Secondary | ICD-10-CM | POA: Insufficient documentation

## 2023-05-21 DIAGNOSIS — G8929 Other chronic pain: Secondary | ICD-10-CM | POA: Insufficient documentation

## 2023-05-21 DIAGNOSIS — G5701 Lesion of sciatic nerve, right lower limb: Secondary | ICD-10-CM | POA: Diagnosis not present

## 2023-05-21 DIAGNOSIS — Z96652 Presence of left artificial knee joint: Secondary | ICD-10-CM | POA: Diagnosis not present

## 2023-05-21 DIAGNOSIS — Z79899 Other long term (current) drug therapy: Secondary | ICD-10-CM | POA: Insufficient documentation

## 2023-05-21 DIAGNOSIS — M1711 Unilateral primary osteoarthritis, right knee: Secondary | ICD-10-CM | POA: Insufficient documentation

## 2023-05-21 DIAGNOSIS — Z9889 Other specified postprocedural states: Secondary | ICD-10-CM | POA: Diagnosis not present

## 2023-05-21 DIAGNOSIS — M461 Sacroiliitis, not elsewhere classified: Secondary | ICD-10-CM

## 2023-05-21 MED ORDER — IOHEXOL 180 MG/ML  SOLN
10.0000 mL | Freq: Once | INTRAMUSCULAR | Status: AC
  Administered 2023-05-21: 10 mL via INTRA_ARTICULAR
  Filled 2023-05-21: qty 20

## 2023-05-21 MED ORDER — IOPAMIDOL (ISOVUE-M 200) INJECTION 41%: 10.0000 mL | Freq: Once | INTRAMUSCULAR | Status: DC

## 2023-05-21 MED ORDER — LIDOCAINE HCL 2 % IJ SOLN
20.0000 mL | Freq: Once | INTRAMUSCULAR | Status: AC
  Administered 2023-05-21: 100 mg
  Filled 2023-05-21: qty 40

## 2023-05-21 MED ORDER — ROPIVACAINE HCL 2 MG/ML IJ SOLN
9.0000 mL | Freq: Once | INTRAMUSCULAR | Status: AC
  Administered 2023-05-21: 9 mL via INTRA_ARTICULAR
  Filled 2023-05-21: qty 20

## 2023-05-21 MED ORDER — DIAZEPAM 5 MG PO TABS
10.0000 mg | ORAL_TABLET | ORAL | Status: AC
  Administered 2023-05-21: 10 mg via ORAL

## 2023-05-21 MED ORDER — METHYLPREDNISOLONE ACETATE 80 MG/ML IJ SUSP
80.0000 mg | Freq: Once | INTRAMUSCULAR | Status: AC
  Administered 2023-05-21: 80 mg via INTRA_ARTICULAR
  Filled 2023-05-21: qty 1

## 2023-05-21 NOTE — Patient Instructions (Signed)
____________________________________________________________________________________________  Post-Procedure Discharge Instructions  Instructions: Apply ice:  Purpose: This will minimize any swelling and discomfort after procedure.  When: Day of procedure, as soon as you get home. How: Fill a plastic sandwich bag with crushed ice. Cover it with a small towel and apply to injection site. How long: (15 min on, 15 min off) Apply for 15 minutes then remove x 15 minutes.  Repeat sequence on day of procedure, until you go to bed. Apply heat:  Purpose: To treat any soreness and discomfort from the procedure. When: Starting the next day after the procedure. How: Apply heat to procedure site starting the day following the procedure. How long: May continue to repeat daily, until discomfort goes away. Food intake: Start with clear liquids (like water) and advance to regular food, as tolerated.  Physical activities: Keep activities to a minimum for the first 8 hours after the procedure. After that, then as tolerated. Driving: If you have received any sedation, be responsible and do not drive. You are not allowed to drive for 24 hours after having sedation. Blood thinner: (Applies only to those taking blood thinners) You may restart your blood thinner 6 hours after your procedure. Insulin: (Applies only to Diabetic patients taking insulin) As soon as you can eat, you may resume your normal dosing schedule. Infection prevention: Keep procedure site clean and dry. Shower daily and clean area with soap and water. Post-procedure Pain Diary: Extremely important that this be done correctly and accurately. Recorded information will be used to determine the next step in treatment. For the purpose of accuracy, follow these rules: Evaluate only the area treated. Do not report or include pain from an untreated area. For the purpose of this evaluation, ignore all other areas of pain, except for the treated area. After  your procedure, avoid taking a long nap and attempting to complete the pain diary after you wake up. Instead, set your alarm clock to go off every hour, on the hour, for the initial 8 hours after the procedure. Document the duration of the numbing medicine, and the relief you are getting from it. Do not go to sleep and attempt to complete it later. It will not be accurate. If you received sedation, it is likely that you were given a medication that may cause amnesia. Because of this, completing the diary at a later time may cause the information to be inaccurate. This information is needed to plan your care. Follow-up appointment: Keep your post-procedure follow-up evaluation appointment after the procedure (usually 2 weeks for most procedures, 6 weeks for radiofrequencies). DO NOT FORGET to bring you pain diary with you.   Expect: (What should I expect to see with my procedure?) From numbing medicine (AKA: Local Anesthetics): Numbness or decrease in pain. You may also experience some weakness, which if present, could last for the duration of the local anesthetic. Onset: Full effect within 15 minutes of injected. Duration: It will depend on the type of local anesthetic used. On the average, 1 to 8 hours.  From steroids (Applies only if steroids were used): Decrease in swelling or inflammation. Once inflammation is improved, relief of the pain will follow. Onset of benefits: Depends on the amount of swelling present. The more swelling, the longer it will take for the benefits to be seen. In some cases, up to 10 days. Duration: Steroids will stay in the system x 2 weeks. Duration of benefits will depend on multiple posibilities including persistent irritating factors. Side-effects: If present, they   may typically last 2 weeks (the duration of the steroids). Frequent: Cramps (if they occur, drink Gatorade and take over-the-counter Magnesium 450-500 mg once to twice a day); water retention with temporary  weight gain; increases in blood sugar; decreased immune system response; increased appetite. Occasional: Facial flushing (red, warm cheeks); mood swings; menstrual changes. Uncommon: Long-term decrease or suppression of natural hormones; bone thinning. (These are more common with higher doses or more frequent use. This is why we prefer that our patients avoid having any injection therapies in other practices.)  Very Rare: Severe mood changes; psychosis; aseptic necrosis. From procedure: Some discomfort is to be expected once the numbing medicine wears off. This should be minimal if ice and heat are applied as instructed.  Call if: (When should I call?) You experience numbness and weakness that gets worse with time, as opposed to wearing off. New onset bowel or bladder incontinence. (Applies only to procedures done in the spine)  Emergency Numbers: Durning business hours (Monday - Thursday, 8:00 AM - 4:00 PM) (Friday, 9:00 AM - 12:00 Noon): [phone] After hours: [phone] NOTE: If you are having a problem and are unable connect with, or to talk to a provider, then go to your nearest urgent care or emergency department. If the problem is serious and urgent, please call 911. ____________________________________________________________________________________________  Sacroiliac (SI) Joint Injection Patient Information  Description: The sacroiliac joint connects the scrum (very low back and tailbone) to the ilium (a pelvic bone which also forms half of the hip joint).  Normally this joint experiences very little motion.  When this joint becomes inflamed or unstable low back and or hip and pelvis pain may result.  Injection of this joint with local anesthetics (numbing medicines) and steroids can provide diagnostic information and reduce pain.  This injection is performed with the aid of x-ray guidance into the tailbone area while you are lying on your stomach.   You may experience an  electrical sensation down the leg while this is being done.  You may also experience numbness.  We also may ask if we are reproducing your normal pain during the injection.  Conditions which may be treated SI injection:  Low back, buttock, hip or leg pain  Preparation for the Injection:  Do not eat any solid food or dairy products within 8 hours of your appointment.  You may drink clear liquids up to 3 hours before appointment.  Clear liquids include water, black coffee, juice or soda.  No milk or cream please. You may take your regular medications, including pain medications with a sip of water before your appointment.  Diabetics should hold regular insulin (if take separately) and take 1/2 normal NPH dose the morning of the procedure.  Carry some sugar containing items with you to your appointment. A driver must accompany you and be prepared to drive you home after your procedure. Bring all of your current medications with you. An IV may be inserted and sedation may be given at the discretion of the physician. A blood pressure cuff, EKG and other monitors will often be applied during the procedure.  Some patients may need to have extra oxygen administered for a short period.  You will be asked to provide medical information, including your allergies, prior to the procedure.  We must know immediately if you are taking blood thinners (like Coumadin/Warfarin) or if you are allergic to IV iodine contrast (dye).  We must know if you could possible be pregnant.  Possible side   effects:  Bleeding from needle site Infection (rare, may require surgery) Nerve injury (rare) Numbness & tingling (temporary) A brief convulsion or seizure Light-headedness (temporary) Pain at injection site (several days) Decreased blood pressure (temporary) Weakness in the leg (temporary)   Call if you experience:  New onset weakness or numbness of an extremity below the injection site that last more than 8  hours. Hives or difficulty breathing ( go to the emergency room) Inflammation or drainage at the injection site Any new symptoms which are concerning to you  Please note:  Although the local anesthetic injected can often make your back/ hip/ buttock/ leg feel good for several hours after the injections, the pain will likely return.  It takes 3-7 days for steroids to work in the sacroiliac area.  You may not notice any pain relief for at least that one week.  If effective, we will often do a series of three injections spaced 3-6 weeks apart to maximally decrease your pain.  After the initial series, we generally will wait some months before a repeat injection of the same type.  If you have any questions, please call [phone] [facility]   

## 2023-05-21 NOTE — Progress Notes (Signed)
 Safety precautions to be maintained throughout the outpatient stay will include: orient to surroundings, keep bed in low position, maintain call bell within reach at all times, provide assistance with transfer out of bed and ambulation.

## 2023-05-21 NOTE — Progress Notes (Signed)
 PROVIDER NOTE: Interpretation of information contained herein should be left to medically-trained personnel. Specific patient instructions are provided elsewhere under "Patient Instructions" section of medical record. This document was created in part using STT-dictation technology, any transcriptional errors that may result from this process are unintentional.  Patient: Anna Barker Type: Established DOB: [birth year] MRN: [id number] PCP: Anna Barker  Service: Procedure DOS: 05/21/2023 Setting: Ambulatory Location: Ambulatory outpatient facility Delivery: Face-to-face Provider: Cephus Collin, MD Specialty: Interventional Pain Management Specialty designation: 09 Location: Outpatient facility Ref. Prov.: [facility]       Interventional Therapy   Procedure: Sacroiliac Joint Steroid Injection #1   and right piriformis TPI Laterality: Right     Level: PSIS (Posterior inferior iliac Spine)  Target: Interarticular sacroiliac joint. Location: Medial to the postero-medial edge of iliac spine. Region: Lumbosacral-sacrococcygeal. Approach: Inferior postero-medial percutaneous approach. Type of procedure: Percutaneous joint injection.  Imaging: Fluoroscopy-guided Non-spinal ([id number]) Anesthesia: Local anesthesia (1-2% Lidocaine) Sedation: Minimal Sedation                       DOS: 05/21/2023  Performed by: Cephus Collin, MD  Purpose: Diagnostic/Therapeutic Indications: Sacroiliac joint pain in the lower back and hip area severe enough to impact quality of life or function. Rationale (medical necessity): procedure needed and proper for the diagnosis and/or treatment of Anna Barker's medical symptoms and needs. 1. SI joint arthritis (HCC)   2. Sacroiliac joint pain   3. Piriformis syndrome of right side   4. Primary osteoarthritis of right knee   5. Chronic pain of right knee    NAS-11 Pain score:   Pre-procedure: 9 /10   Post-procedure: 9 /10      Position / Prep  / Materials:  Position: Prone  Prep solution: ChloraPrep (2% chlorhexidine gluconate and 70% isopropyl alcohol) Prep Area: Entire posterior lumbosacral area  Materials:  Tray: Block Needle(s):  Type: Spinal  Gauge (G): 22  Length: 3.5-in Qty: One (1) per procedure side.  H&P (Pre-op Assessment):  Anna Barker is a 43 y.o. (year old), female patient, seen today for interventional treatment. 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 Anna Barker has a current medication list which includes the following prescription(s): amlodipine, cyclobenzaprine, diclofenac, hydrochlorothiazide, and omeprazole, and the following Facility-Administered Medications: iopamidol. Her primarily concern today is the Back Pain (Lower worse on right side)  Initial Vital Signs:  Pulse/HCG Rate: 98ECG Heart Rate: 85 Temp: 98.4 F (36.9 C) Resp: 18 BP: (!) 132/97 SpO2: 98 %  BMI: Estimated body mass index is 49.49 kg/m as calculated from the following:   Height as of this encounter: 5\' 7"  (1.702 m).   Weight as of this encounter: 316 lb (143.3 kg).  Risk Assessment: Allergies: Reviewed. She has no known allergies.  Allergy Precautions: None required Coagulopathies: Reviewed. None identified.  Blood-thinner therapy: None at this time Active Infection(s): Reviewed. None identified. Anna Barker is afebrile  Site Confirmation: Anna Barker was asked to confirm the procedure and laterality before marking the site Procedure checklist: Completed Consent: Before the procedure and under the influence of no sedative(s), amnesic(s), or anxiolytics, the patient was informed of the treatment options, risks and possible complications. To fulfill our ethical and legal obligations, as recommended by the American Medical Association's Code of Ethics, I have informed the patient of my clinical impression; the nature and  purpose of the treatment or procedure; the risks, benefits, and possible  complications of the intervention; the alternatives, including doing nothing; the risk(s) and benefit(s) of the alternative treatment(s) or procedure(s); and the risk(s) and benefit(s) of doing nothing. The patient was provided information about the general risks and possible complications associated with the procedure. These may include, but are not limited to: failure to achieve desired goals, infection, bleeding, organ or nerve damage, allergic reactions, paralysis, and death. In addition, the patient was informed of those risks and complications associated to the procedure, such as failure to decrease pain; infection; bleeding; organ or nerve damage with subsequent damage to sensory, motor, and/or autonomic systems, resulting in permanent pain, numbness, and/or weakness of one or several areas of the body; allergic reactions; (i.e.: anaphylactic reaction); and/or death. Furthermore, the patient was informed of those risks and complications associated with the medications. These include, but are not limited to: allergic reactions (i.e.: anaphylactic or anaphylactoid reaction(s)); adrenal axis suppression; blood sugar elevation that in diabetics may result in ketoacidosis or comma; water retention that in patients with history of congestive heart failure may result in shortness of breath, pulmonary edema, and decompensation with resultant heart failure; weight gain; swelling or edema; medication-induced neural toxicity; particulate matter embolism and blood vessel occlusion with resultant organ, and/or nervous system infarction; and/or aseptic necrosis of one or more joints. Finally, the patient was informed that Medicine is not an exact science; therefore, there is also the possibility of unforeseen or unpredictable risks and/or possible complications that may result in a catastrophic outcome. The patient indicated having understood very  clearly. We have given the patient no guarantees and we have made no promises. Enough time was given to the patient to ask questions, all of which were answered to the patient's satisfaction. Anna Barker has indicated that she wanted to continue with the procedure. Attestation: I, the ordering provider, attest that I have discussed with the patient the benefits, risks, side-effects, alternatives, likelihood of achieving goals, and potential problems during recovery for the procedure that I have provided informed consent. Date  Time: 05/21/2023  9:03 AM  Pre-Procedure Preparation:  Monitoring: As per clinic protocol. Respiration, ETCO2, SpO2, BP, heart rate and rhythm monitor placed and checked for adequate function Safety Precautions: Patient was assessed for positional comfort and pressure points before starting the procedure. Time-out: I initiated and conducted the "Time-out" before starting the procedure, as per protocol. The patient was asked to participate by confirming the accuracy of the "Time Out" information. Verification of the correct person, site, and procedure were performed and confirmed by me, the nursing staff, and the patient. "Time-out" conducted as per Joint Commission's Universal Protocol (UP.01.01.01). Time: 0941 Start Time: 0941 hrs.  Description/Narrative of Procedure:          Start Time: 0941 hrs.  Rationale (medical necessity): procedure needed and proper for the diagnosis and/or treatment of the patient's medical symptoms and needs. Procedural Technique Safety Precautions: Aspiration looking for blood return was conducted prior to all injections. At no point did we inject any substances, as a needle was being advanced. No attempts were made at seeking any paresthesias. Safe injection practices and needle disposal techniques used. Medications properly checked for expiration dates. SDV (single dose vial) medications used. Description of the Procedure: Protocol guidelines were  followed. The patient was assisted into a comfortable position. The target area was identified and the area prepped in the usual manner. Skin & deeper tissues infiltrated with local anesthetic. Appropriate amount of time allowed to pass for local anesthetics to take effect. The  procedure needles were then advanced to the target area. Proper needle placement secured. Negative aspiration confirmed. Solution injected in intermittent fashion, asking for systemic symptoms every 0.5cc of injectate. The needles were then removed and the area cleansed, making sure to leave some of the prepping solution back to take advantage of its long term bactericidal properties.  Technical description of procedure:  Fluoroscopy using a posterior anterior 45 degree angle from the midline aiming at the anterolateral aspect of the patient was used to find a direct path into the sacroiliac joint, the superior medial to posterior superior iliac spine.  The skin was marked where the desired target and the skin infiltrated with local anesthetics.  The procedure needle was then advanced until the joint was entered.  Once inside of the joint, we then proceeded to inject the desired solution.  10 cc solution made of 9 cc of 0.2% ropivacaine, 1 cc of methylprednisolone, 80 mg/cc.  5 cc injected into the right SI joint.   Afterwards a right piriformis trigger point injection was done 1 cm inferior, 1 cm deep, 1 cm lateral to the inferior fissure of the SI joint.  Contrast was injected to confirm piriformis muscle striation.  While injecting, patient did not complain of any pain radiating down his leg.  5 cc of above nerve block solution injected into the right piriformis.     Vitals:   05/21/23 0931 05/21/23 0935 05/21/23 0941 05/21/23 0944  BP: (!) 145/104 (!) 140/101 (!) 136/106 (!) 145/83  Pulse:      Resp: 18 17 18 17   Temp:      SpO2: 99% 97% 98% 99%  Weight:      Height:         End Time: 0944 hrs.  Imaging Guidance  (Non-Spinal):          Type of Imaging Technique: Fluoroscopy Guidance (Non-Spinal) Indication(s): Fluoroscopy guidance for needle placement to enhance accuracy in procedures requiring precise needle localization for targeted delivery of medication in or near specific anatomical locations not easily accessible without such real-time imaging assistance. Exposure Time: Please see nurses notes. Contrast: None used. Fluoroscopic Guidance: I was personally present during the use of fluoroscopy. "Tunnel Vision Technique" used to obtain the best possible view of the target area. Parallax error corrected before commencing the procedure. "Direction-depth-direction" technique used to introduce the needle under continuous pulsed fluoroscopy. Once target was reached, antero-posterior, oblique, and lateral fluoroscopic projection used confirm needle placement in all planes. Images permanently stored in EMR. Interpretation: No contrast injected. I personally interpreted the imaging intraoperatively. Adequate needle placement confirmed in multiple planes. Permanent images saved into the patient's record.  Post-operative Assessment:  Post-procedure Vital Signs:  Pulse/HCG Rate: 9885 Temp: 98.4 F (36.9 C) Resp: 17 BP: (!) 145/83 SpO2: 99 %  EBL: None  Complications: No immediate post-treatment complications observed by team, or reported by patient.  Note: The patient tolerated the entire procedure well. A repeat set of vitals were taken after the procedure and the patient was kept under observation following institutional policy, for this type of procedure. Post-procedural neurological assessment was performed, showing return to baseline, prior to discharge. The patient was provided with post-procedure discharge instructions, including a section on how to identify potential problems. Should any problems arise concerning this procedure, the patient was given instructions to immediately contact us, at any time,  without hesitation. In any case, we plan to contact the patient by telephone for a follow-up status report regarding this interventional procedure.  Comments:  No additional relevant information.  Plan of Care (POC)  Orders:  Orders Placed This Encounter  Procedures   Radiofrequency,Genicular    Standing Status:   Future    Expected Date:   06/18/2023    Expiration Date:   08/20/2023    Scheduling Instructions:     Procedure: Genicular nerve(s) Cooled Radiofrequency Ablation (CRFA)     Side(s): RIGHT     Level(s): Superior-Lateral, Superior-Medial, and Inferior-Medial Genicular Nerve(s)     Sedation: IV Versed     Scheduling Timeframe: As soon as pre-approved    Where will this procedure be performed?:   [facility]   [facility] C-ARM 1-60 MIN NO REPORT    Intraoperative interpretation by procedural physician at [facility].    Standing Status:   Standing    Number of Occurrences:   1    Reason for exam::   Assistance in needle guidance and placement for procedures requiring needle placement in or near specific anatomical locations not easily accessible without such assistance.   Patient is complaining of creased right knee pain related to right knee osteoarthritis.  She is status post right genicular nerve RFA on 01/29/2023.  Given return of pain, she would like to repeat this.  Medications ordered for procedure: Meds ordered this encounter  Medications   iohexol (OMNIPAQUE) 180 MG/ML injection 10 mL    Must be Myelogram-compatible. If not available, you may substitute with a water-soluble, non-ionic, hypoallergenic, myelogram-compatible radiological contrast medium.   iopamidol (ISOVUE-M) 41 % intrathecal injection 10 mL    Must be Myelogram-compatible. If not available, you may substitute with a water-soluble, non-ionic, hypoallergenic, myelogram-compatible radiological contrast medium.   lidocaine (XYLOCAINE) 2 % (with pres) injection 400 mg   diazepam  (VALIUM) tablet 10 mg    Make sure Flumazenil is available in the pyxis when using this medication. If oversedation occurs, administer 0.2 mg IV over 15 sec. If after 45 sec no response, administer 0.2 mg again over 1 min; may repeat at 1 min intervals; not to exceed 4 doses (1 mg)   ropivacaine (PF) 2 mg/mL (0.2%) (NAROPIN) injection 9 mL   methylPREDNISolone acetate (DEPO-MEDROL) injection 80 mg   Medications administered: We administered iohexol, lidocaine, diazepam, ropivacaine (PF) 2 mg/mL (0.2%), and methylPREDNISolone acetate.  See the medical record for exact dosing, route, and time of administration.  Follow-up plan:   Return in about 8 weeks (around 07/16/2023) for PPE, F2F.       Left knee genicular nerve block 10/13/20, bilateral GNB 01/17/21         Recent Visits Date Type Provider Dept  04/11/23 Office Visit Cephus Collin, MD [facility]  03/08/23 Office Visit Cephus Collin, MD [facility]  Showing recent visits within past 90 days and meeting all other requirements Today's Visits Date Type Provider Dept  05/21/23 Procedure visit Cephus Collin, MD [facility]  Showing today's visits and meeting all other requirements Future Appointments Date Type Provider Dept  07/16/23 Appointment Cephus Collin, MD [facility]  Showing future appointments within next 90 days and meeting all other requirements  Disposition: Discharge home  Discharge (Date  Time): 05/21/2023; 0950 hrs.   Primary Care Physician: Anna Barker Location: [facility] Note by: Cephus Collin, MD (TTS technology used. I apologize for any typographical errors that were not detected and corrected.) Date: 05/21/2023; Time: 9:54 AM  Disclaimer:  Medicine is not an Visual merchandiser. The only  guarantee in medicine is that nothing is guaranteed. It is important to note that the decision to proceed with this intervention was based on the  information collected from the patient. The Data and conclusions were drawn from the patient's questionnaire, the interview, and the physical examination. Because the information was provided in large part by the patient, it cannot be guaranteed that it has not been purposely or unconsciously manipulated. Every effort has been made to obtain as much relevant data as possible for this evaluation. It is important to note that the conclusions that lead to this procedure are derived in large part from the available data. Always take into account that the treatment will also be dependent on availability of resources and existing treatment guidelines, considered by other Pain Management Practitioners as being common knowledge and practice, at the time of the intervention. For Medico-Legal purposes, it is also important to point out that variation in procedural techniques and pharmacological choices are the acceptable norm. The indications, contraindications, technique, and results of the above procedure should only be interpreted and judged by a Board-Certified Interventional Pain Specialist with extensive familiarity and expertise in the same exact procedure and technique.

## 2023-05-22 ENCOUNTER — Telehealth: Payer: Self-pay

## 2023-05-22 NOTE — Telephone Encounter (Signed)
 No issues post-procedure.

## 2023-05-30 ENCOUNTER — Ambulatory Visit (INDEPENDENT_AMBULATORY_CARE_PROVIDER_SITE_OTHER): Admitting: Orthopaedic Surgery

## 2023-05-30 DIAGNOSIS — M1811 Unilateral primary osteoarthritis of first carpometacarpal joint, right hand: Secondary | ICD-10-CM | POA: Diagnosis not present

## 2023-05-30 DIAGNOSIS — G5601 Carpal tunnel syndrome, right upper limb: Secondary | ICD-10-CM | POA: Diagnosis not present
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
## 2023-06-03 ENCOUNTER — Ambulatory Visit (INDEPENDENT_AMBULATORY_CARE_PROVIDER_SITE_OTHER)

## 2023-06-03 ENCOUNTER — Ambulatory Visit
Admission: RE | Admit: 2023-06-03 | Discharge: 2023-06-03 | Disposition: A | Discharge status: Home / Self Care | Source: Ambulatory Visit | Attending: Physician Assistant | Admitting: Physician Assistant

## 2023-06-03 VITALS — BP 130/89 | HR 78 | Temp 97.5°F | Resp 14 | Ht 67.0 in | Wt 315.9 lb

## 2023-06-03 DIAGNOSIS — M25462 Effusion, left knee: Secondary | ICD-10-CM | POA: Diagnosis not present

## 2023-06-03 DIAGNOSIS — S8992XA Unspecified injury of left lower leg, initial encounter: Secondary | ICD-10-CM

## 2023-06-03 DIAGNOSIS — M25562 Pain in left knee: Secondary | ICD-10-CM

## 2023-06-03 DIAGNOSIS — M79605 Pain in left leg: Secondary | ICD-10-CM | POA: Diagnosis not present

## 2023-06-03 DIAGNOSIS — Z96652 Presence of left artificial knee joint: Secondary | ICD-10-CM | POA: Diagnosis not present

## 2023-06-03 MED ORDER — NAPROXEN 500 MG PO TABS: 500.0000 mg | ORAL_TABLET | Freq: Two times a day (BID) | ORAL | 0 refills | Status: DC

## 2023-06-03 MED ORDER — KETOROLAC TROMETHAMINE 60 MG/2ML IM SOLN
30.0000 mg | Freq: Once | INTRAMUSCULAR | Status: AC
  Administered 2023-06-03: 30 mg via INTRAMUSCULAR

## 2023-06-03 MED ORDER — HYDROCODONE-ACETAMINOPHEN 5-325 MG PO TABS: 1.0000 | ORAL_TABLET | Freq: Three times a day (TID) | ORAL | 0 refills | Status: AC | PRN

## 2023-06-03 NOTE — ED Triage Notes (Signed)
 Patient states that she slipped on her floors at her apartment last night.  Patient c/o left knee and left leg pain.  Patient denies hitting her head.

## 2023-06-03 NOTE — Discharge Instructions (Signed)
-   X-rays were negative for fracture or dislocation.  As discussed, x-rays cannot evaluate ligament and meniscus and there is some concern for possible medial ligament or meniscus tear or sprain.  Advised to follow-up with your orthopedic provider or summa the office below.  Might need an MRI especially if this is not improving. - Try to avoid weightbearing as much as possible.  Elevate and ice the knee. - I sent anti-inflammatory medicine and pain medicine to the pharmacy.  You have a condition requiring you to follow up with Orthopedics so please call one of the following office for appointment:   [facility] Address: [address] [place], [place] [address] Phone: [phone]  [facility] [address], [place], [place] [address] Phone: [phone]  [facility] [address], [place], [place] [address] Phone: [phone]
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
## 2023-06-05 DIAGNOSIS — M1711 Unilateral primary osteoarthritis, right knee: Secondary | ICD-10-CM | POA: Diagnosis not present

## 2023-06-05 DIAGNOSIS — M25561 Pain in right knee: Secondary | ICD-10-CM | POA: Diagnosis not present

## 2023-06-20 ENCOUNTER — Other Ambulatory Visit: Payer: Self-pay | Admitting: Nurse Practitioner

## 2023-06-20 ENCOUNTER — Telehealth: Payer: Self-pay | Admitting: Student in an Organized Health Care Education/Training Program

## 2023-06-20 ENCOUNTER — Other Ambulatory Visit: Payer: Self-pay

## 2023-06-20 ENCOUNTER — Encounter (HOSPITAL_BASED_OUTPATIENT_CLINIC_OR_DEPARTMENT_OTHER): Payer: Self-pay | Admitting: Orthopaedic Surgery

## 2023-06-20 MED ORDER — TIZANIDINE HCL 4 MG PO TABS: 4.0000 mg | ORAL_TABLET | Freq: Three times a day (TID) | ORAL | 0 refills | Status: DC

## 2023-06-20 NOTE — Progress Notes (Signed)
   06/20/23 1232  PAT Phone Screen  Is the patient taking a GLP-1 receptor agonist? No  Do You Have Diabetes? No  Do You Have Hypertension? (S)  Yes  Have You Ever Been to the ER for Asthma? No  Have You Taken Oral Steroids in the Past 3 Months? No  Do you Take Phenteramine or any Other Diet Drugs? No  Recent  Lab Work, EKG, CXR? No  Do you have a history of heart problems? No  Any Recent Hospitalizations? (S)  No (Pt had a fall on 06/03/23, slipped on water. Pt was not hospitalized and is able to move forward with surgery)  Height 5\' 7"  (1.702 m)  Weight (!) (S)  143.3 kg (Anesthesia consult requested)  Pat Appointment Scheduled (S)  Yes (BMP, EKG, Weight/ anesthesia consult)

## 2023-06-20 NOTE — Telephone Encounter (Signed)
 Patient says last procedure did not help at all. She is having muscle spasms and wants to know if Dr Rhesa Celeste will call in something for muscle spasms.  This needs routed to Marthe Slain, NP per Dr Rhesa Celeste

## 2023-06-21 ENCOUNTER — Other Ambulatory Visit: Payer: Self-pay | Admitting: Physician Assistant

## 2023-06-21 MED ORDER — ONDANSETRON HCL 4 MG PO TABS: 4.0000 mg | ORAL_TABLET | Freq: Three times a day (TID) | ORAL | 0 refills | Status: DC | PRN

## 2023-06-21 MED ORDER — HYDROCODONE-ACETAMINOPHEN 5-325 MG PO TABS: 1.0000 | ORAL_TABLET | Freq: Three times a day (TID) | ORAL | 0 refills | Status: DC | PRN

## 2023-06-25 ENCOUNTER — Encounter (HOSPITAL_BASED_OUTPATIENT_CLINIC_OR_DEPARTMENT_OTHER)
Admission: RE | Admit: 2023-06-25 | Discharge: 2023-06-25 | Disposition: A | Discharge status: Home / Self Care | Source: Ambulatory Visit | Attending: Orthopaedic Surgery | Admitting: Orthopaedic Surgery

## 2023-06-25 DIAGNOSIS — I10 Essential (primary) hypertension: Secondary | ICD-10-CM | POA: Insufficient documentation

## 2023-06-25 DIAGNOSIS — Z01818 Encounter for other preprocedural examination: Secondary | ICD-10-CM | POA: Insufficient documentation

## 2023-06-25 LAB — BASIC METABOLIC PANEL WITH GFR
Anion gap: 13 (ref 5–15)
BUN: 11 mg/dL (ref 6–20)
CO2: 23 mmol/L (ref 22–32)
Calcium: 9.3 mg/dL (ref 8.9–10.3)
Chloride: 101 mmol/L (ref 98–111)
Creatinine, Ser: 0.9 mg/dL (ref 0.44–1.00)
GFR, Estimated: >60 mL/min (ref >60)
Glucose, Bld: 98 mg/dL (ref 70–99)
Potassium: 3.3 mmol/L — ABNORMAL LOW (ref 3.5–5.1)
Sodium: 137 mmol/L (ref 135–145)

## 2023-06-25 NOTE — Progress Notes (Signed)
 Anesthesia consult with Dr. Oneal Bienenstock for [facility]

## 2023-06-25 NOTE — Anesthesia Preprocedure Evaluation (Addendum)
 Anesthesia Evaluation  Patient identified by MRN, date of birth, ID band Patient awake    Reviewed: Allergy & Precautions, H&P , NPO status , Patient's Chart, lab work & pertinent test results  Airway Mallampati: II  TM Distance: >3 FB Neck ROM: Full   Comment: Posterior displaced upper canines Dental no notable dental hx. (+) Teeth Intact, Dental Advisory Given, Missing,    Pulmonary sleep apnea , former smoker   Pulmonary exam normal breath sounds clear to auscultation       Cardiovascular Exercise Tolerance: Good hypertension, Pt. on medications Normal cardiovascular exam Rhythm:Regular Rate:Normal     Neuro/Psych  PSYCHIATRIC DISORDERS Anxiety Depression     Neuromuscular disease    GI/Hepatic Neg liver ROS,GERD  ,,  Endo/Other    Class 4 obesity  Renal/GU negative Renal ROS  negative genitourinary   Musculoskeletal  (+) Arthritis ,    Abdominal   Peds  Hematology negative hematology ROS (+)   Anesthesia Other Findings   Reproductive/Obstetrics negative OB ROS                             Anesthesia Physical Anesthesia Plan  ASA: 2  Anesthesia Plan: General   Post-op Pain Management: Tylenol  PO (pre-op)*, Regional block* and Toradol  IV (intra-op)*   Induction: Intravenous  PONV Risk Score and Plan: 3 and Midazolam , Dexamethasone  and Ondansetron   Airway Management Planned: LMA  Additional Equipment: None  Intra-op Plan:   Post-operative Plan:   Informed Consent: I have reviewed the patients History and Physical, chart, labs and discussed the procedure including the risks, benefits and alternatives for the proposed anesthesia with the patient or authorized representative who has indicated his/her understanding and acceptance.     Dental advisory given  Plan Discussed with: Anesthesiologist and CRNA  Anesthesia Plan Comments:         Anesthesia Quick  Evaluation

## 2023-06-27 ENCOUNTER — Encounter (HOSPITAL_BASED_OUTPATIENT_CLINIC_OR_DEPARTMENT_OTHER)
Admission: RE | Disposition: A | Discharge status: Home / Self Care | Payer: Self-pay | Source: Home / Self Care | Attending: Orthopaedic Surgery

## 2023-06-27 ENCOUNTER — Other Ambulatory Visit: Payer: Self-pay

## 2023-06-27 ENCOUNTER — Encounter (HOSPITAL_BASED_OUTPATIENT_CLINIC_OR_DEPARTMENT_OTHER): Payer: Self-pay | Admitting: Orthopaedic Surgery

## 2023-06-27 ENCOUNTER — Ambulatory Visit (HOSPITAL_BASED_OUTPATIENT_CLINIC_OR_DEPARTMENT_OTHER): Payer: Self-pay | Admitting: Anesthesiology

## 2023-06-27 ENCOUNTER — Ambulatory Visit (HOSPITAL_BASED_OUTPATIENT_CLINIC_OR_DEPARTMENT_OTHER)

## 2023-06-27 ENCOUNTER — Ambulatory Visit (HOSPITAL_BASED_OUTPATIENT_CLINIC_OR_DEPARTMENT_OTHER)
Admission: RE | Admit: 2023-06-27 | Discharge: 2023-06-27 | Disposition: A | Discharge status: Home / Self Care | Attending: Orthopaedic Surgery | Admitting: Orthopaedic Surgery

## 2023-06-27 DIAGNOSIS — G5601 Carpal tunnel syndrome, right upper limb: Secondary | ICD-10-CM | POA: Diagnosis present

## 2023-06-27 DIAGNOSIS — Z6841 Body Mass Index (BMI) 40.0 and over, adult: Secondary | ICD-10-CM | POA: Diagnosis not present

## 2023-06-27 DIAGNOSIS — Z87891 Personal history of nicotine dependence: Secondary | ICD-10-CM | POA: Insufficient documentation

## 2023-06-27 DIAGNOSIS — I10 Essential (primary) hypertension: Secondary | ICD-10-CM

## 2023-06-27 DIAGNOSIS — G8918 Other acute postprocedural pain: Secondary | ICD-10-CM | POA: Diagnosis not present

## 2023-06-27 DIAGNOSIS — Z5986 Financial insecurity: Secondary | ICD-10-CM | POA: Diagnosis not present

## 2023-06-27 DIAGNOSIS — M1811 Unilateral primary osteoarthritis of first carpometacarpal joint, right hand: Secondary | ICD-10-CM | POA: Diagnosis not present

## 2023-06-27 DIAGNOSIS — M1990 Unspecified osteoarthritis, unspecified site: Secondary | ICD-10-CM | POA: Insufficient documentation

## 2023-06-27 DIAGNOSIS — G4730 Sleep apnea, unspecified: Secondary | ICD-10-CM | POA: Diagnosis not present

## 2023-06-27 DIAGNOSIS — K219 Gastro-esophageal reflux disease without esophagitis: Secondary | ICD-10-CM | POA: Diagnosis not present

## 2023-06-27 DIAGNOSIS — E6689 Other obesity not elsewhere classified: Secondary | ICD-10-CM | POA: Insufficient documentation

## 2023-06-27 DIAGNOSIS — Z79899 Other long term (current) drug therapy: Secondary | ICD-10-CM | POA: Insufficient documentation

## 2023-06-27 DIAGNOSIS — Z01818 Encounter for other preprocedural examination: Secondary | ICD-10-CM

## 2023-06-27 LAB — POCT PREGNANCY, URINE: Preg Test, Ur: NEGATIVE

## 2023-06-27 SURGERY — CARPAL TUNNEL RELEASE
Anesthesia: General | Site: Wrist | Laterality: Right

## 2023-06-27 MED ORDER — ONDANSETRON HCL 4 MG/2ML IJ SOLN
INTRAMUSCULAR | Status: AC
  Filled 2023-06-27: qty 2

## 2023-06-27 MED ORDER — FENTANYL CITRATE (PF) 100 MCG/2ML IJ SOLN
INTRAMUSCULAR | Status: DC | PRN
  Administered 2023-06-27: 50 ug via INTRAVENOUS

## 2023-06-27 MED ORDER — ONDANSETRON HCL 4 MG/2ML IJ SOLN
4.0000 mg | Freq: Once | INTRAMUSCULAR | Status: AC
  Administered 2023-06-27: 4 mg via INTRAVENOUS

## 2023-06-27 MED ORDER — OXYCODONE HCL 5 MG PO TABS
5.0000 mg | ORAL_TABLET | Freq: Once | ORAL | Status: AC
  Administered 2023-06-27: 5 mg via ORAL

## 2023-06-27 MED ORDER — OXYCODONE HCL 5 MG PO TABS
ORAL_TABLET | ORAL | Status: AC
  Filled 2023-06-27: qty 1

## 2023-06-27 MED ORDER — LIDOCAINE 2% (20 MG/ML) 5 ML SYRINGE
INTRAMUSCULAR | Status: AC
  Filled 2023-06-27: qty 5

## 2023-06-27 MED ORDER — ACETAMINOPHEN 500 MG PO TABS
1000.0000 mg | ORAL_TABLET | Freq: Once | ORAL | Status: AC
  Administered 2023-06-27: 1000 mg via ORAL

## 2023-06-27 MED ORDER — MIDAZOLAM HCL 5 MG/5ML IJ SOLN
INTRAMUSCULAR | Status: DC | PRN
  Administered 2023-06-27 (×2): 1 mg via INTRAVENOUS

## 2023-06-27 MED ORDER — DEXAMETHASONE SODIUM PHOSPHATE 10 MG/ML IJ SOLN
INTRAMUSCULAR | Status: DC | PRN
  Administered 2023-06-27: 8 mg via INTRAVENOUS

## 2023-06-27 MED ORDER — DEXAMETHASONE SODIUM PHOSPHATE 10 MG/ML IJ SOLN
INTRAMUSCULAR | Status: AC
  Filled 2023-06-27: qty 1

## 2023-06-27 MED ORDER — FENTANYL CITRATE (PF) 100 MCG/2ML IJ SOLN: 25.0000 ug | INTRAMUSCULAR | Status: DC | PRN

## 2023-06-27 MED ORDER — FENTANYL CITRATE (PF) 100 MCG/2ML IJ SOLN
INTRAMUSCULAR | Status: AC
  Filled 2023-06-27: qty 2

## 2023-06-27 MED ORDER — FENTANYL CITRATE (PF) 100 MCG/2ML IJ SOLN
100.0000 ug | Freq: Once | INTRAMUSCULAR | Status: AC
  Administered 2023-06-27: 100 ug via INTRAVENOUS

## 2023-06-27 MED ORDER — AMISULPRIDE (ANTIEMETIC) 5 MG/2ML IV SOLN
INTRAVENOUS | Status: AC
Start: 2023-06-27 — End: ?
  Filled 2023-06-27: qty 4

## 2023-06-27 MED ORDER — CEFAZOLIN SODIUM-DEXTROSE 3-4 GM/150ML-% IV SOLN
3.0000 g | INTRAVENOUS | Status: AC
  Administered 2023-06-27: 3 g via INTRAVENOUS

## 2023-06-27 MED ORDER — SUCCINYLCHOLINE CHLORIDE 200 MG/10ML IV SOSY
PREFILLED_SYRINGE | INTRAVENOUS | Status: DC | PRN
  Administered 2023-06-27: 140 mg via INTRAVENOUS

## 2023-06-27 MED ORDER — LACTATED RINGERS IV SOLN: INTRAVENOUS | Status: DC

## 2023-06-27 MED ORDER — ROCURONIUM BROMIDE 10 MG/ML (PF) SYRINGE
PREFILLED_SYRINGE | INTRAVENOUS | Status: DC | PRN
  Administered 2023-06-27: 20 mg via INTRAVENOUS

## 2023-06-27 MED ORDER — PROPOFOL 10 MG/ML IV BOLUS
INTRAVENOUS | Status: DC | PRN
  Administered 2023-06-27: 170 mg via INTRAVENOUS

## 2023-06-27 MED ORDER — LIDOCAINE 2% (20 MG/ML) 5 ML SYRINGE
INTRAMUSCULAR | Status: DC | PRN
  Administered 2023-06-27: 100 mg via INTRAVENOUS

## 2023-06-27 MED ORDER — MIDAZOLAM HCL 2 MG/2ML IJ SOLN
2.0000 mg | Freq: Once | INTRAMUSCULAR | Status: AC
  Administered 2023-06-27: 2 mg via INTRAVENOUS

## 2023-06-27 MED ORDER — BUPIVACAINE-EPINEPHRINE (PF) 0.5% -1:200000 IJ SOLN
INTRAMUSCULAR | Status: DC | PRN
  Administered 2023-06-27: 30 mL via PERINEURAL

## 2023-06-27 MED ORDER — SUCCINYLCHOLINE CHLORIDE 200 MG/10ML IV SOSY
PREFILLED_SYRINGE | INTRAVENOUS | Status: AC
  Filled 2023-06-27: qty 10

## 2023-06-27 MED ORDER — 0.9 % SODIUM CHLORIDE (POUR BTL) OPTIME
TOPICAL | Status: DC | PRN
  Administered 2023-06-27: 1000 mL

## 2023-06-27 MED ORDER — ONDANSETRON HCL 4 MG/2ML IJ SOLN: 4.0000 mg | Freq: Once | INTRAMUSCULAR | Status: DC | PRN

## 2023-06-27 MED ORDER — CEFAZOLIN SODIUM-DEXTROSE 3-4 GM/150ML-% IV SOLN
INTRAVENOUS | Status: AC
  Filled 2023-06-27: qty 150

## 2023-06-27 MED ORDER — SUGAMMADEX SODIUM 200 MG/2ML IV SOLN
INTRAVENOUS | Status: DC | PRN
  Administered 2023-06-27: 300 mg via INTRAVENOUS

## 2023-06-27 MED ORDER — LIDOCAINE-EPINEPHRINE (PF) 1 %-1:200000 IJ SOLN
INTRAMUSCULAR | Status: AC
  Filled 2023-06-27: qty 30

## 2023-06-27 MED ORDER — AMISULPRIDE (ANTIEMETIC) 5 MG/2ML IV SOLN
10.0000 mg | Freq: Once | INTRAVENOUS | Status: AC | PRN
  Administered 2023-06-27: 10 mg via INTRAVENOUS

## 2023-06-27 MED ORDER — MIDAZOLAM HCL 2 MG/2ML IJ SOLN
INTRAMUSCULAR | Status: AC
Start: 2023-06-27 — End: ?
  Filled 2023-06-27: qty 2

## 2023-06-27 MED ORDER — BUPIVACAINE HCL (PF) 0.25 % IJ SOLN
INTRAMUSCULAR | Status: AC
  Filled 2023-06-27: qty 30

## 2023-06-27 MED ORDER — MIDAZOLAM HCL 2 MG/2ML IJ SOLN
INTRAMUSCULAR | Status: AC
  Filled 2023-06-27: qty 2

## 2023-06-27 MED ORDER — ROCURONIUM BROMIDE 10 MG/ML (PF) SYRINGE
PREFILLED_SYRINGE | INTRAVENOUS | Status: AC
  Filled 2023-06-27: qty 10

## 2023-06-27 MED ORDER — ACETAMINOPHEN 500 MG PO TABS
ORAL_TABLET | ORAL | Status: AC
  Filled 2023-06-27: qty 2

## 2023-06-27 MED ORDER — ONDANSETRON HCL 4 MG/2ML IJ SOLN
INTRAMUSCULAR | Status: DC | PRN
  Administered 2023-06-27: 4 mg via INTRAVENOUS

## 2023-06-27 MED ORDER — CLONIDINE HCL (ANALGESIA) 100 MCG/ML EP SOLN
EPIDURAL | Status: DC | PRN
Start: 2023-06-27 — End: 2023-06-27
  Administered 2023-06-27: 50 ug

## 2023-06-27 SURGICAL SUPPLY — 54 items
BAND RUBBER #18 3X1/16 STRL (MISCELLANEOUS) ×4 IMPLANT
BLADE MINI RND TIP GREEN BEAV (BLADE) ×2 IMPLANT
BLADE SURG 15 STRL LF DISP TIS (BLADE) ×4 IMPLANT
BNDG ELASTIC 2INX 5YD STR LF (GAUZE/BANDAGES/DRESSINGS) IMPLANT
BNDG ELASTIC 3INX 5YD STR LF (GAUZE/BANDAGES/DRESSINGS) ×2 IMPLANT
BNDG ESMARK 4X9 LF (GAUZE/BANDAGES/DRESSINGS) ×2 IMPLANT
BRUSH SCRUB EZ PLAIN DRY (MISCELLANEOUS) ×2 IMPLANT
CORD BIPOLAR FORCEPS 12FT (ELECTRODE) ×2 IMPLANT
COVER BACK TABLE 60X90IN (DRAPES) ×2 IMPLANT
CUFF TOURN SGL QUICK 18X4 (TOURNIQUET CUFF) ×2 IMPLANT
DRAPE EXTREMITY T 121X128X90 (DISPOSABLE) ×2 IMPLANT
DRAPE IMP U-DRAPE 54X76 (DRAPES) ×2 IMPLANT
DRAPE OEC MINIVIEW 54X84 (DRAPES) IMPLANT
DRAPE SURG 17X23 STRL (DRAPES) ×2 IMPLANT
DRAPE U-SHAPE 47X51 STRL (DRAPES) ×2 IMPLANT
DURAPREP 26ML APPLICATOR (WOUND CARE) ×2 IMPLANT
GAUZE SPONGE 4X4 12PLY STRL (GAUZE/BANDAGES/DRESSINGS) ×2 IMPLANT
GAUZE XEROFORM 1X8 LF (GAUZE/BANDAGES/DRESSINGS) ×2 IMPLANT
GLOVE BIOGEL PI IND STRL 7.0 (GLOVE) IMPLANT
GLOVE BIOGEL PI IND STRL 7.5 (GLOVE) ×2 IMPLANT
GLOVE ECLIPSE 7.0 STRL STRAW (GLOVE) ×2 IMPLANT
GLOVE INDICATOR 7.0 STRL GRN (GLOVE) ×2 IMPLANT
GLOVE SURG SYN 7.5 E (GLOVE) ×4 IMPLANT
GLOVE SURG SYN 7.5 PF PI (GLOVE) ×4 IMPLANT
GOWN STRL REUS W/ TWL LRG LVL3 (GOWN DISPOSABLE) ×2 IMPLANT
GOWN STRL REUS W/ TWL XL LVL3 (GOWN DISPOSABLE) ×2 IMPLANT
GOWN STRL SURGICAL XL XLNG (GOWN DISPOSABLE) ×4 IMPLANT
MANIFOLD NEPTUNE II (INSTRUMENTS) IMPLANT
NDL HYPO 25X1 1.5 SAFETY (NEEDLE) ×2 IMPLANT
NEEDLE HYPO 25X1 1.5 SAFETY (NEEDLE) ×2 IMPLANT
NS IRRIG 1000ML POUR BTL (IV SOLUTION) ×2 IMPLANT
PACK BASIN DAY SURGERY FS (CUSTOM PROCEDURE TRAY) ×2 IMPLANT
PAD CAST 3X4 CTTN HI CHSV (CAST SUPPLIES) ×2 IMPLANT
PADDING CAST ABS COTTON 4X4 ST (CAST SUPPLIES) ×2 IMPLANT
PADDING CAST COTTON 2X4 NS (CAST SUPPLIES) IMPLANT
SHEET MEDIUM DRAPE 40X70 STRL (DRAPES) ×2 IMPLANT
SLEEVE SCD COMPRESS KNEE MED (STOCKING) ×2 IMPLANT
SLING ARM FOAM STRAP LRG (SOFTGOODS) IMPLANT
SOL PREP POV-IOD 4OZ 10% (MISCELLANEOUS) IMPLANT
SOLUTION SCRB POV-IOD 4OZ 7.5% (MISCELLANEOUS) IMPLANT
SPLINT FIBERGLASS 3X35 (CAST SUPPLIES) IMPLANT
SPONGE T-LAP 18X18 ~~LOC~~+RFID (SPONGE) ×2 IMPLANT
STOCKINETTE 4X48 STRL (DRAPES) ×2 IMPLANT
SUCTION TUBE FRAZIER 10FR DISP (SUCTIONS) IMPLANT
SUT ETHILON 3 0 PS 1 (SUTURE) ×2 IMPLANT
SUT ETHILON 4 0 PS 2 18 (SUTURE) ×2 IMPLANT
SUT VIC AB 2-0 SH 27XBRD (SUTURE) ×2 IMPLANT
SUT VIC AB 3-0 SH 27X BRD (SUTURE) IMPLANT
SUT VICRYL 0 SH 27 (SUTURE) IMPLANT
SYR BULB EAR ULCER 3OZ GRN STR (SYRINGE) ×2 IMPLANT
TOWEL GREEN STERILE FF (TOWEL DISPOSABLE) ×4 IMPLANT
TRAY DSU PREP LF (CUSTOM PROCEDURE TRAY) ×2 IMPLANT
TUBE CONNECTING 20X1/4 (TUBING) ×2 IMPLANT
UNDERPAD 30X36 HEAVY ABSORB (UNDERPADS AND DIAPERS) ×2 IMPLANT

## 2023-06-27 NOTE — Discharge Instructions (Addendum)
 Postoperative instructions:  Weightbearing instructions: non weight bearing  Dressing instructions: Keep your dressing and/or splint clean and dry at all times.  It will be removed at your first post-operative appointment.  Your stitches and/or staples will be removed at this visit.  Incision instructions:  Do not soak your incision for 3 weeks after surgery.  If the incision gets wet, pat dry and do not scrub the incision.  Pain control:  You have been given a prescription to be taken as directed for post-operative pain control.  In addition, elevate the operative extremity above the heart at all times to prevent swelling and throbbing pain.  Take over-the-counter Colace, 100mg  by mouth twice a day while taking narcotic pain medications to help prevent constipation.  Follow up appointments: 1) 14 days for suture removal and wound check. 2) Dr. Christiane Cowing as scheduled.   -------------------------------------------------------------------------------------------------------------  After Surgery Pain Control:  After your surgery, post-surgical discomfort or pain is likely. This discomfort can last several days to a few weeks. At certain times of the day your discomfort may be more intense.  Did you receive a nerve block?  A nerve block can provide pain relief for one hour to two days after your surgery. As long as the nerve block is working, you will experience little or no sensation in the area the surgeon operated on.  As the nerve block wears off, you will begin to experience pain or discomfort. It is very important that you begin taking your prescribed pain medication before the nerve block fully wears off. Treating your pain at the first sign of the block wearing off will ensure your pain is better controlled and more tolerable when full-sensation returns. Do not wait until the pain is intolerable, as the medicine will be less effective. It is better to treat pain in advance than to try and  catch up.  General Anesthesia:  If you did not receive a nerve block during your surgery, you will need to start taking your pain medication shortly after your surgery and should continue to do so as prescribed by your surgeon.  Pain Medication:  Most commonly we prescribe Vicodin and Percocet for post-operative pain. Both of these medications contain a combination of acetaminophen  (Tylenol ) and a narcotic to help control pain.   It takes between 30 and 45 minutes before pain medication starts to work. It is important to take your medication before your pain level gets too intense.   Nausea is a common side effect of many pain medications. You will want to eat something before taking your pain medicine to help prevent nausea.   If you are taking a prescription pain medication that contains acetaminophen , we recommend that you do not take additional over the counter acetaminophen  (Tylenol ).  Other pain relieving options:   Using a cold pack to ice the affected area a few times a day (15 to 20 minutes at a time) can help to relieve pain, reduce swelling and bruising.   Elevation of the affected area can also help to reduce pain and swelling.  Per [facility] clinic policy, our goal is ensure optimal postoperative pain control with a multimodal pain management strategy. For all [facility] patients, our goal is to wean post-operative narcotic medications by 6 weeks post-operatively. If this is not possible due to utilization of pain medication prior to surgery, your [facility] doctor will support your acute post-operative pain control for the first 6 weeks postoperatively, with a plan to transition you back to your  primary pain team following that. Max Spain will work to ensure a Therapist, occupational.      Post Anesthesia Home Care Instructions  Activity: Get plenty of rest for the remainder of the day. A responsible individual must stay with you for 24 hours following the procedure.  For the next 24  hours, DO NOT: -Drive a car -Advertising copywriter -Drink alcoholic beverages -Take any medication unless instructed by your physician -Make any legal decisions or sign important papers.  Meals: Start with liquid foods such as gelatin or soup. Progress to regular foods as tolerated. Avoid greasy, spicy, heavy foods. If nausea and/or vomiting occur, drink only clear liquids until the nausea and/or vomiting subsides. Call your physician if vomiting continues.  Special Instructions/Symptoms: Your throat may feel dry or sore from the anesthesia or the breathing tube placed in your throat during surgery. If this causes discomfort, gargle with warm salt water. The discomfort should disappear within 24 hours.  If you had a scopolamine  patch placed behind your ear for the management of post- operative nausea and/or vomiting:  1. The medication in the patch is effective for 72 hours, after which it should be removed.  Wrap patch in a tissue and discard in the trash. Wash hands thoroughly with soap and water. 2. You may remove the patch earlier than 72 hours if you experience unpleasant side effects which may include dry mouth, dizziness or visual disturbances. 3. Avoid touching the patch. Wash your hands with soap and water after contact with the patch.    Next dose of tylenol  if needed will be after 4PM

## 2023-06-27 NOTE — H&P (Signed)
 PREOPERATIVE H&P  Chief Complaint: right carpal tunnel syndrome, right thumb carpometacarpal arthrtitis  HPI: Anna Barker is a 43 y.o. female who presents for surgical treatment of right carpal tunnel syndrome, right thumb carpometacarpal arthrtitis.  She denies any changes in medical history.  Past Surgical History:  Procedure Laterality Date   CHOLECYSTECTOMY     TOTAL KNEE ARTHROPLASTY Left 07/28/2021   Procedure: TOTAL KNEE ARTHROPLASTY;  Surgeon: Molli Angelucci, MD;  Location: [facility] ORS;  Service: Orthopedics;  Laterality: Left;   TUBAL LIGATION     WISDOM TOOTH EXTRACTION     Social History   Socioeconomic History   Marital status: Single    Spouse name: Not on file   Number of children: 3   Years of education: 12   Highest education level: High school graduate  Occupational History   Occupation: not employed   Tobacco Use   Smoking status: Former    Types: Cigarettes    Passive exposure: Never   Smokeless tobacco: Never  Vaping Use   Vaping status: Never Used  Substance and Sexual Activity   Alcohol use: Yes    Alcohol/week: 3.0 standard drinks of alcohol    Types: 3 Shots of liquor per week    Comment: rarely   Drug use: Yes    Frequency: 7.0 times per week    Types: Marijuana    Comment: last week   Sexual activity: Yes    Partners: Male    Birth control/protection: Surgical  Other Topics Concern   Not on file  Social History Narrative   Patient and her 3 children are currently living at her parents due to a financial hardship and patient being unemployed since May. Patient voices that she has a good relationship with her mom and an unpredictable relationship with her dad due to his issues. Patient reports a close relationship with her sister, but is currently not on good terms with either of her two brothers. Patient reports not having any close friends and is currently not in a relationship.    Social Drivers of Health   Financial Resource Strain:  Medium Risk (01/11/2023)   Received from [facility]   Overall Financial Resource Strain (CARDIA)    Difficulty of Paying Living Expenses: Somewhat hard  Food Insecurity: Food Insecurity Present (01/11/2023)   Received from [facility]   Hunger Vital Sign    Worried About Running Out of Food in the Last Year: Sometimes true    Ran Out of Food in the Last Year: Sometimes true  Transportation Needs: No Transportation Needs (01/11/2023)   Received from [facility] - Transportation    In the past 12 months, has lack of transportation kept you from medical appointments or from getting medications?: No    Lack of Transportation (Non-Medical): No  Recent Concern: Transportation Needs - Unmet Transportation Needs (10/31/2022)   Received from [facility] - Transportation    In the past 12 months, has lack of transportation kept you from medical appointments or from getting medications?: Yes    Lack of Transportation (Non-Medical): Yes  Physical Activity: Unknown (11/23/2022)   Received from [facility]   Exercise Vital Sign    Days of Exercise per Week: Not on file    Minutes of Exercise per Session: 20 min  Stress: Stress Concern Present (11/23/2022)   Received from [facility]  [facility] Occup[facility] - Occupational Stress Questionnaire    Feeling of Stress : Very much  Social Connections: Socially Isolated (11/23/2022)   Received from [facility]   Social Connection and Isolation Panel [NHANES]    Frequency of Communication with Friends and Family: More than three times a week    Frequency of Social Gatherings with Friends and Family: Once a week    Attends Religious Services: Never    Database administrator or Organizations: No    Attends Engineer, structural: Never    Marital Status: Never married   Family History   Problem Relation Age of Onset   Breast cancer Mother 45   No Known Allergies Prior to Admission medications   Medication Sig Start Date End Date Taking? Authorizing Provider  amLODipine (NORVASC) 10 MG tablet  10/31/22  Yes [provider]  hydrochlorothiazide  (HYDRODIURIL ) 25 MG tablet Take 1 tablet by mouth daily. 08/27/20 06/27/23 Yes [provider]  naproxen  (NAPROSYN ) 500 MG tablet Take 1 tablet (500 mg total) by mouth 2 (two) times daily. 06/03/23  Yes Floydene Hy, PA-C  omeprazole (PRILOSEC) 20 MG capsule Take 20 mg by mouth daily.   Yes [provider]  HYDROcodone -acetaminophen  (NORCO/VICODIN) 5-325 MG tablet Take 1 tablet by mouth 3 (three) times daily as needed for moderate pain (pain score 4-6). To be taken after surgery 06/21/23   Sandie Cross, PA-C  ondansetron  (ZOFRAN ) 4 MG tablet Take 1 tablet (4 mg total) by mouth every 8 (eight) hours as needed for nausea or vomiting. 06/21/23   Sandie Cross, PA-C  tiZANidine  (ZANAFLEX ) 4 MG tablet Take 1 tablet (4 mg total) by mouth 3 (three) times daily. 06/20/23 07/20/23  Patel, Seema K, NP     Positive ROS: All other systems have been reviewed and were otherwise negative with the exception of those mentioned in the HPI and as above.  Physical Exam: General: Alert, no acute distress Cardiovascular: No pedal edema Respiratory: No cyanosis, no use of accessory musculature GI: abdomen soft Skin: No lesions in the area of chief complaint Neurologic: Sensation intact distally Psychiatric: Patient is competent for consent with normal mood and affect Lymphatic: no lymphedema  MUSCULOSKELETAL: exam stable  Assessment: right carpal tunnel syndrome, right thumb carpometacarpal arthrtitis  Plan: Plan for Procedure(s): CARPAL TUNNEL RELEASE ARTHROPLASTY, FINGER  The risks benefits and alternatives were discussed with the patient including but not limited to the risks of nonoperative treatment, versus  surgical intervention including infection, bleeding, nerve injury,  blood clots, cardiopulmonary complications, morbidity, mortality, among others, and they were willing to proceed.   Claria Crofts, MD 06/27/2023 9:48 AM

## 2023-06-27 NOTE — Op Note (Signed)
 DATE OF SURGERY:06/27/2023  PREOPERATIVE DIAGNOSIS: Right thumb CMC joint arthritis. Right carpal tunnel syndrome  POSTOPERATIVE DIAGNOSIS: same.  PROCEDURE:  1.  Right thumb carpometacarpal interposition arthroplasty ([id number]); thumb  2.  Transfer of FCR tendon to base of thumb metacarpal (CPT [id number])  3.  Right carpal tunnel release CPT 64721 separate incision  OPERATIVE FINDINGS: Complete loss of articular cartilage and joint space from thumb CMC joint  IMPLANTS: None  SURGEON: N. Claria Crofts, MD  ASSIST: Sharran Decent, PA-C; necessary for the timely completion of procedure and due to complexity of procedure.  ANESTHESIA: General and regional block  TOURNIQUET TIME: see anesthesia record.  BLOOD LOSS: Minimal.  COMPLICATIONS: None.  PATHOLOGY: None.  TIME OUT: A time out was performed before the procedure started.  INDICATIONS FOR PROCEDURE: The patient presents today for the above mentioned procedure after failing extensive conservative measures.  The risks, benefits, and alternatives to surgery were discussed and the patient elected to proceed with surgery.  DESCRIPTION OF PROCEDURE: A 2 cm incision was made at the base of the palm about 5 mm ulnar to the thenar crease.  Dissection was carried down through the subcutaneous tissue and the palmar fascia.  The sentinel fat was identified and a mosquito hemostat was placed deep to the transverse carpal ligament which was sharply divided with a Beaver blade on top of the mosquito hemostat under direct visualization from distal to proximal.  A subcutaneous tunnel was then created and a small retractor was placed.  The wrist was placed into flexion and the remaining transverse carpal ligament and the antebrachial fascia were released under direct visualization with tenotomy scissors.  The remaining fibrous bands distally were released with tenotomy.  Care was taken not to go past Kaplan's cardinal line.  The surgical site  was thoroughly irrigated and closed with 3-0 nylon.  We then turned our attention to the [facility] arthroplasty.  A separate incision was made.  A Wagner's incision was made. The superficial radial nerve branches were identified and mobilized aside. Radial artery was exposed and protected. We dissected sharply in between the extensor pollicis brevis and abductor pollicis longus tendon interval. Capsulotomy was performed. Capsular flaps were raised.  Trapezium was exposed and removed in piecemeal fashion with an osteotome and rongeur.   We then made an incision approximately 10 cm proximal to the wrist flexion crease overlying the FCR tendon.  The FCR tendon was then transected and brought through the [facility] incision.  The FCR was then split in half.  Drill was used to create a bone tunnel at the base of the thumb metacarpal.  A 2-0 Vicryl was used to shuttle one half of the FCR slip through this bone tunnel and brought out dorsally.  The 2 slips were then tied and secured with a 0 Vicryl.  The excess tendon was rolled into an anchovy and secured with 0 Vicryl and placed in the space created from the trapeziectomy.  Thumb range of motion was assessed through abduction, palmar abduction and opposition to make sure it was not overtensioned.   The wound was thoroughly irrigated.  Then tourniquet was deflated. Hemostasis achieved.  The capsule was closed over the anchovy with 0 Vicryl.  Subcuticular layer closed with 3-0 Vicryl.  Skin closed using 4-0 nylon sutures. Sterile dressing applied, hand immobilized in a thumb spica splint. The patient then was transferred to the recovery room in stable condition after all counts were correct.   POSTOPERATIVE PLAN: Return in two  weeks for suture removal and application of a thumb spica cast. Four weeks after surgery cast will be removed and switch to a thumb spica brace and start hand therapy.

## 2023-06-27 NOTE — Progress Notes (Signed)
Assisted Dr. Desmond Lope with right, axillary, ultrasound guided block. Side rails up, monitors on throughout procedure. See vital signs in flow sheet. Tolerated Procedure well.

## 2023-06-27 NOTE — Anesthesia Procedure Notes (Signed)
 Anesthesia Regional Block: Axillary brachial plexus block   Pre-Anesthetic Checklist: , timeout performed,  Correct Patient, Correct Site, Correct Laterality,  Correct Procedure, Correct Position, site marked,  Risks and benefits discussed,  Surgical consent,  Pre-op evaluation,  At surgeon's request and post-op pain management  Laterality: Right  Prep: chloraprep       Needles:  Injection technique: Single-shot  Needle Type: Echogenic Needle     Needle Length: 9cm  Needle Gauge: 21     Additional Needles:   Procedures:,,,, ultrasound used (permanent image in chart),,    Narrative:  Start time: 06/27/2023 10:40 AM End time: 06/27/2023 10:49 AM Injection made incrementally with aspirations every 5 mL.  Performed by: Personally  Anesthesiologist: Erin Havers, MD  Additional Notes: No pain on injection. No increased resistance to injection. Injection made in 5cc increments.  Good needle visualization.  Patient tolerated procedure well.

## 2023-06-27 NOTE — Anesthesia Postprocedure Evaluation (Signed)
 Anesthesia Post Note  Patient: Anna Barker  Procedure(s) Performed: CARPAL TUNNEL RELEASE (Right: Wrist) ARTHROPLASTY, FINGER (Right: Finger)     Patient location during evaluation: PACU Anesthesia Type: General Level of consciousness: awake and alert Pain management: pain level controlled Vital Signs Assessment: post-procedure vital signs reviewed and stable Respiratory status: spontaneous breathing, nonlabored ventilation and respiratory function stable Cardiovascular status: blood pressure returned to baseline and stable Postop Assessment: no apparent nausea or vomiting Anesthetic complications: no   No notable events documented.  Last Vitals:  Vitals:   06/27/23 1055 06/27/23 1330  BP:  (!) 137/97  Pulse: 93 90  Resp: 17 16  Temp:  (!) 36.2 C  SpO2: 93% 94%    Last Pain:  Vitals:   06/27/23 1330  TempSrc:   PainSc: 2                  Erin Havers

## 2023-06-27 NOTE — Transfer of Care (Signed)
 Immediate Anesthesia Transfer of Care Note  Patient: Anna Barker  Procedure(s) Performed: Procedure(s) (LRB): CARPAL TUNNEL RELEASE (Right) ARTHROPLASTY, FINGER (Right)  Patient Location: PACU  Anesthesia Type: General  Level of Consciousness: awake, oriented, sedated and patient cooperative  Airway & Oxygen Therapy: Patient Spontanous Breathing and Patient connected to face mask oxygen  Post-op Assessment: Report given to PACU RN and Post -op Vital signs reviewed and stable  Post vital signs: Reviewed and stable  Complications: No apparent anesthesia complications Last Vitals:  Vitals Value Taken Time  BP 137/97 06/27/23 1331  Temp    Pulse 90 06/27/23 1334  Resp 13 06/27/23 1334  SpO2 93 % 06/27/23 1334  Vitals shown include unfiled device data.  Last Pain:  Vitals:   06/27/23 0954  TempSrc: Temporal  PainSc: 0-No pain      Patients Stated Pain Goal: 5 (06/27/23 0954)  Complications: No notable events documented.

## 2023-06-27 NOTE — Anesthesia Procedure Notes (Signed)
 Procedure Name: Intubation Date/Time: 06/27/2023 12:06 PM  Performed by: Glo Larch, CRNAPre-anesthesia Checklist: Patient identified, Emergency Drugs available, Suction available and Patient being monitored Patient Re-evaluated:Patient Re-evaluated prior to induction Oxygen Delivery Method: Circle system utilized Preoxygenation: Pre-oxygenation with 100% oxygen Induction Type: IV induction Ventilation: Mask ventilation without difficulty Laryngoscope Size: Mac Grade View: Grade I Tube type: Oral Tube size: 7.0 mm Number of attempts: 1 Airway Equipment and Method: Stylet and Oral airway Placement Confirmation: ETT inserted through vocal cords under direct vision, positive ETCO2 and breath sounds checked- equal and bilateral Secured at: 22 cm Tube secured with: Tape Dental Injury: Teeth and Oropharynx as per pre-operative assessment

## 2023-06-28 ENCOUNTER — Telehealth: Payer: Self-pay

## 2023-06-28 ENCOUNTER — Encounter (HOSPITAL_BASED_OUTPATIENT_CLINIC_OR_DEPARTMENT_OTHER): Payer: Self-pay | Admitting: Orthopaedic Surgery

## 2023-06-28 NOTE — Telephone Encounter (Signed)
 I have no idea what to make of that. She could call the surgical center and see what anesthesia thinks

## 2023-06-28 NOTE — Telephone Encounter (Signed)
 Patient had right carpal tunnel release yesterday. She called triage today because she is having pain. She is taking naproxen  and hydrocodone . She also states that it feels like she has a bubble in her throat and has been coughing. Please advise. [phone]

## 2023-06-28 NOTE — Telephone Encounter (Signed)
 Called and relayed all this to patient. She states understanding.

## 2023-06-28 NOTE — Telephone Encounter (Signed)
 Can take one norco every 6 hours instead of every 8 and can take 800 mg ibuprofen  every 6-8 hours for the next few days.  Ice and elevate

## 2023-07-01 ENCOUNTER — Encounter: Payer: Self-pay | Admitting: Orthopaedic Surgery

## 2023-07-03 ENCOUNTER — Other Ambulatory Visit: Payer: Self-pay | Admitting: Physician Assistant

## 2023-07-03 ENCOUNTER — Telehealth: Payer: Self-pay | Admitting: Orthopaedic Surgery

## 2023-07-03 MED ORDER — TRAMADOL HCL 50 MG PO TABS: 50.0000 mg | ORAL_TABLET | Freq: Three times a day (TID) | ORAL | 0 refills | Status: DC | PRN

## 2023-07-03 NOTE — Telephone Encounter (Signed)
 Patient called. She would like a refill on hydrocodone . Her cb# [phone]

## 2023-07-03 NOTE — Telephone Encounter (Signed)
Cannot refill norco, but sent in tramadol

## 2023-07-03 NOTE — Telephone Encounter (Signed)
 Notified via mychart.

## 2023-07-03 NOTE — Telephone Encounter (Signed)
 Cannot refill norco but sent in tramadol 

## 2023-07-11 ENCOUNTER — Ambulatory Visit (INDEPENDENT_AMBULATORY_CARE_PROVIDER_SITE_OTHER): Admitting: Physician Assistant

## 2023-07-11 ENCOUNTER — Other Ambulatory Visit: Payer: Self-pay

## 2023-07-11 ENCOUNTER — Encounter: Payer: Self-pay | Admitting: Physician Assistant

## 2023-07-11 DIAGNOSIS — G5601 Carpal tunnel syndrome, right upper limb: Secondary | ICD-10-CM

## 2023-07-11 DIAGNOSIS — M1811 Unilateral primary osteoarthritis of first carpometacarpal joint, right hand: Secondary | ICD-10-CM

## 2023-07-11 MED ORDER — HYDROCODONE-ACETAMINOPHEN 5-325 MG PO TABS: 1.0000 | ORAL_TABLET | Freq: Three times a day (TID) | ORAL | 0 refills | Status: DC | PRN
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
## 2023-07-12 ENCOUNTER — Encounter: Payer: Self-pay | Admitting: Student in an Organized Health Care Education/Training Program

## 2023-07-12 ENCOUNTER — Ambulatory Visit
Attending: Student in an Organized Health Care Education/Training Program | Admitting: Student in an Organized Health Care Education/Training Program

## 2023-07-12 VITALS — BP 151/117 | HR 110 | Temp 97.7°F | Resp 16 | Ht 67.0 in | Wt 319.0 lb

## 2023-07-12 DIAGNOSIS — G5701 Lesion of sciatic nerve, right lower limb: Secondary | ICD-10-CM | POA: Diagnosis not present

## 2023-07-12 DIAGNOSIS — F1290 Cannabis use, unspecified, uncomplicated: Secondary | ICD-10-CM | POA: Diagnosis not present

## 2023-07-12 DIAGNOSIS — Z713 Dietary counseling and surveillance: Secondary | ICD-10-CM | POA: Insufficient documentation

## 2023-07-12 DIAGNOSIS — Z87891 Personal history of nicotine dependence: Secondary | ICD-10-CM | POA: Diagnosis not present

## 2023-07-12 DIAGNOSIS — M25561 Pain in right knee: Secondary | ICD-10-CM | POA: Insufficient documentation

## 2023-07-12 DIAGNOSIS — M1711 Unilateral primary osteoarthritis, right knee: Secondary | ICD-10-CM | POA: Diagnosis not present

## 2023-07-12 DIAGNOSIS — Z6841 Body Mass Index (BMI) 40.0 and over, adult: Secondary | ICD-10-CM | POA: Insufficient documentation

## 2023-07-12 DIAGNOSIS — G8929 Other chronic pain: Secondary | ICD-10-CM | POA: Diagnosis not present

## 2023-07-12 DIAGNOSIS — M533 Sacrococcygeal disorders, not elsewhere classified: Secondary | ICD-10-CM | POA: Insufficient documentation

## 2023-07-12 NOTE — Patient Instructions (Signed)
GENERAL RISKS AND COMPLICATIONS  What are the risk, side effects and possible complications? Generally speaking, most procedures are safe.  However, with any procedure there are risks, side effects, and the possibility of complications.  The risks and complications are dependent upon the sites that are lesioned, or the type of nerve block to be performed.  The closer the procedure is to the spine, the more serious the risks are.  Great care is taken when placing the radio frequency needles, block needles or lesioning probes, but sometimes complications can occur. Infection: Any time there is an injection through the skin, there is a risk of infection.  This is why sterile conditions are used for these blocks.  There are four possible types of infection. Localized skin infection. Central Nervous System Infection-This can be in the form of Meningitis, which can be deadly. Epidural Infections-This can be in the form of an epidural abscess, which can cause pressure inside of the spine, causing compression of the spinal cord with subsequent paralysis. This would require an emergency surgery to decompress, and there are no guarantees that the patient would recover from the paralysis. Discitis-This is an infection of the intervertebral discs.  It occurs in about 1% of discography procedures.  It is difficult to treat and it may lead to surgery.        2. Pain: the needles have to go through skin and soft tissues, will cause soreness.       3. Damage to internal structures:  The nerves to be lesioned may be near blood vessels or    other nerves which can be potentially damaged.       4. Bleeding: Bleeding is more common if the patient is taking blood thinners such as  aspirin, Coumadin, Ticiid, Plavix, etc., or if he/she have some genetic predisposition  such as hemophilia. Bleeding into the spinal canal can cause compression of the spinal  cord with subsequent paralysis.  This would require an emergency  surgery to  decompress and there are no guarantees that the patient would recover from the  paralysis.       5. Pneumothorax:  Puncturing of a lung is a possibility, every time a needle is introduced in  the area of the chest or upper back.  Pneumothorax refers to free air around the  collapsed lung(s), inside of the thoracic cavity (chest cavity).  Another two possible  complications related to a similar event would include: Hemothorax and Chylothorax.   These are variations of the Pneumothorax, where instead of air around the collapsed  lung(s), you may have blood or chyle, respectively.       6. Spinal headaches: They may occur with any procedures in the area of the spine.       7. Persistent CSF (Cerebro-Spinal Fluid) leakage: This is a rare problem, but may occur  with prolonged intrathecal or epidural catheters either due to the formation of a fistulous  track or a dural tear.       8. Nerve damage: By working so close to the spinal cord, there is always a possibility of  nerve damage, which could be as serious as a permanent spinal cord injury with  paralysis.       9. Death:  Although rare, severe deadly allergic reactions known as "Anaphylactic  reaction" can occur to any of the medications used.      10. Worsening of the symptoms:  We can always make thing worse.  What are the chances   of something like this happening? Chances of any of this occuring are extremely low.  By statistics, you have more of a chance of getting killed in a motor vehicle accident: while driving to the hospital than any of the above occurring .  Nevertheless, you should be aware that they are possibilities.  In general, it is similar to taking a shower.  Everybody knows that you can slip, hit your head and get killed.  Does that mean that you should not shower again?  Nevertheless always keep in mind that statistics do not mean anything if you happen to be on the wrong side of them.  Even if a procedure has a 1 (one) in a  1,000,000 (million) chance of going wrong, it you happen to be that one..Also, keep in mind that by statistics, you have more of a chance of having something go wrong when taking medications.  Who should not have this procedure? If you are on a blood thinning medication (e.g. Coumadin, Plavix, see list of "Blood Thinners"), or if you have an active infection going on, you should not have the procedure.  If you are taking any blood thinners, please inform your physician.  How should I prepare for this procedure? Do not eat or drink anything at least six hours prior to the procedure. Bring a driver with you .  It cannot be a taxi. Come accompanied by an adult that can drive you back, and that is strong enough to help you if your legs get weak or numb from the local anesthetic. Take all of your medicines the morning of the procedure with just enough water to swallow them. If you have diabetes, make sure that you are scheduled to have your procedure done first thing in the morning, whenever possible. If you have diabetes, take only half of your insulin dose and notify our nurse that you have done so as soon as you arrive at the clinic. If you are diabetic, but only take blood sugar pills (oral hypoglycemic), then do not take them on the morning of your procedure.  You may take them after you have had the procedure. Do not take aspirin or any aspirin-containing medications, at least eleven (11) days prior to the procedure.  They may prolong bleeding. Wear loose fitting clothing that may be easy to take off and that you would not mind if it got stained with Betadine or blood. Do not wear any jewelry or perfume Remove any nail coloring.  It will interfere with some of our monitoring equipment.  NOTE: Remember that this is not meant to be interpreted as a complete list of all possible complications.  Unforeseen problems may occur.  BLOOD THINNERS The following drugs contain aspirin or other products,  which can cause increased bleeding during surgery and should not be taken for 2 weeks prior to and 1 week after surgery.  If you should need take something for relief of minor pain, you may take acetaminophen which is found in Tylenol,m Datril, Anacin-3 and Panadol. It is not blood thinner. The products listed below are.  Do not take any of the products listed below in addition to any listed on your instruction sheet.  A.P.C or A.P.C with Codeine Codeine Phosphate Capsules #3 Ibuprofen Ridaura  ABC compound Congesprin Imuran rimadil  Advil Cope Indocin Robaxisal  Alka-Seltzer Effervescent Pain Reliever and Antacid Coricidin or Coricidin-D  Indomethacin Rufen  Alka-Seltzer plus Cold Medicine Cosprin Ketoprofen S-A-C Tablets  Anacin Analgesic Tablets or Capsules Coumadin   Korlgesic Salflex  Anacin Extra Strength Analgesic tablets or capsules CP-2 Tablets Lanoril Salicylate  Anaprox Cuprimine Capsules Levenox Salocol  Anexsia-D Dalteparin Magan Salsalate  Anodynos Darvon compound Magnesium Salicylate Sine-off  Ansaid Dasin Capsules Magsal Sodium Salicylate  Anturane Depen Capsules Marnal Soma  APF Arthritis pain formula Dewitt's Pills Measurin Stanback  Argesic Dia-Gesic Meclofenamic Sulfinpyrazone  Arthritis Bayer Timed Release Aspirin Diclofenac Meclomen Sulindac  Arthritis pain formula Anacin Dicumarol Medipren Supac  Analgesic (Safety coated) Arthralgen Diffunasal Mefanamic Suprofen  Arthritis Strength Bufferin Dihydrocodeine Mepro Compound Suprol  Arthropan liquid Dopirydamole Methcarbomol with Aspirin Synalgos  ASA tablets/Enseals Disalcid Micrainin Tagament  Ascriptin Doan's Midol Talwin  Ascriptin A/D Dolene Mobidin Tanderil  Ascriptin Extra Strength Dolobid Moblgesic Ticlid  Ascriptin with Codeine Doloprin or Doloprin with Codeine Momentum Tolectin  Asperbuf Duoprin Mono-gesic Trendar  Aspergum Duradyne Motrin or Motrin IB Triminicin  Aspirin plain, buffered or enteric coated  Durasal Myochrisine Trigesic  Aspirin Suppositories Easprin Nalfon Trillsate  Aspirin with Codeine Ecotrin Regular or Extra Strength Naprosyn Uracel  Atromid-S Efficin Naproxen Ursinus  Auranofin Capsules Elmiron Neocylate Vanquish  Axotal Emagrin Norgesic Verin  Azathioprine Empirin or Empirin with Codeine Normiflo Vitamin E  Azolid Emprazil Nuprin Voltaren  Bayer Aspirin plain, buffered or children's or timed BC Tablets or powders Encaprin Orgaran Warfarin Sodium  Buff-a-Comp Enoxaparin Orudis Zorpin  Buff-a-Comp with Codeine Equegesic Os-Cal-Gesic   Buffaprin Excedrin plain, buffered or Extra Strength Oxalid   Bufferin Arthritis Strength Feldene Oxphenbutazone   Bufferin plain or Extra Strength Feldene Capsules Oxycodone with Aspirin   Bufferin with Codeine Fenoprofen Fenoprofen Pabalate or Pabalate-SF   Buffets II Flogesic Panagesic   Buffinol plain or Extra Strength Florinal or Florinal with Codeine Panwarfarin   Buf-Tabs Flurbiprofen Penicillamine   Butalbital Compound Four-way cold tablets Penicillin   Butazolidin Fragmin Pepto-Bismol   Carbenicillin Geminisyn Percodan   Carna Arthritis Reliever Geopen Persantine   Carprofen Gold's salt Persistin   Chloramphenicol Goody's Phenylbutazone   Chloromycetin Haltrain Piroxlcam   Clmetidine heparin Plaquenil   Cllnoril Hyco-pap Ponstel   Clofibrate Hydroxy chloroquine Propoxyphen         Before stopping any of these medications, be sure to consult the physician who ordered them.  Some, such as Coumadin (Warfarin) are ordered to prevent or treat serious conditions such as "deep thrombosis", "pumonary embolisms", and other heart problems.  The amount of time that you may need off of the medication may also vary with the medication and the reason for which you were taking it.  If you are taking any of these medications, please make sure you notify your pain physician before you undergo any procedures.         Moderate Conscious  Sedation, Adult Sedation is the use of medicines to help you relax and not feel pain. Moderate conscious sedation is a type of sedation that makes you less alert than normal. You are still able to respond to instructions, touch, or both. This type of sedation is used during short medical and dental procedures. It is milder than deep sedation, which is a type of sedation you cannot be easily woken up from. It is also milder than general anesthesia, which is the use of medicines to make you fall asleep. Moderate conscious sedation lets you return to your normal activities sooner. Tell a health care provider about: Any allergies you have. All medicines you are taking, including vitamins, herbs, steroids, eye drops, creams, and over-the-counter medicines. Any problems you or family members have had with anesthesia.   Any bleeding problems you have. Any surgeries you have had. Any medical conditions you have. Whether you are pregnant or may be pregnant. Any recent alcohol, tobacco, or drug use. What are the risks? Your health care provider will talk with you about risks. These may include: Oversedation. This is when you get too much medicine. Nausea or vomiting. Allergic reaction to medicines. Trouble breathing. If this happens, a breathing tube may be used. It will be removed when you can breathe better on your own. Heart trouble. Lung trouble. Emergence delirium. This is when you feel confused while the sedation wears off. This gets better with time. What happens before the procedure? When to stop eating and drinking Follow instructions from your health care provider about what you may eat and drink. These may include: 8 hours before your procedure Stop eating most foods. Do not eat meat, fried foods, or fatty foods. Eat only light foods, such as toast or crackers. All liquids are okay except energy drinks and alcohol. 6 hours before your procedure Stop eating. Drink only clear liquids, such  as water, clear fruit juice, black coffee, plain tea, and sports drinks. Do not drink energy drinks or alcohol. 2 hours before your procedure Stop drinking all liquids. You may be allowed to take medicines with small sips of water. If you do not follow your health care provider's instructions, your procedure may be delayed or canceled. Medicines Ask your health care provider about: Changing or stopping your regular medicines. These include any diabetes medicines or blood thinners you take. Taking medicines such as aspirin and ibuprofen. These medicines can thin your blood. Do not take them unless your health care provider tells you to. Taking over-the-counter medicines, vitamins, herbs, and supplements. Tests and exams You may have an exam or testing. You may have a blood or urine sample taken. General instructions Do not use any products that contain nicotine or tobacco for at least 4 weeks before the procedure. These products include cigarettes, chewing tobacco, and vaping devices, such as e-cigarettes. If you need help quitting, ask your health care provider. If you will be going home right after the procedure, plan to have a responsible adult: Take you home from the hospital or clinic. You will not be allowed to drive. Care for you for the time you are told. What happens during the procedure?  You will be given the sedative. It may be given: As a pill you can take by mouth. It can also be put into the rectum. As a spray through the nose. As an injection into muscle. As an injection into a vein through an IV. You may be given oxygen as needed. Your blood pressure, heart rate, breathing rate, and blood oxygen level will be monitored during the procedure. The medical or dental procedure will be done. The procedure may vary among health care providers and hospitals. What happens after the procedure? Your blood pressure, heart rate, breathing rate, and blood oxygen level will be  monitored until you leave the hospital or clinic. You will get fluids through an IV as needed. Do not drive or operate machinery until your health care provider says that it is safe. This information is not intended to replace advice given to you by your health care provider. Make sure you discuss any questions you have with your health care provider. Document Revised: 08/08/2021 Document Reviewed: 08/08/2021 Else[employer] Patient Education  2024 [employer] Radiofrequency Ablation Radiofrequency ablation is a procedure that is performed to relieve pain. The   procedure is often used for back, neck, or arm pain. Radiofrequency ablation involves the use of a machine that creates radio waves to make heat. During the procedure, the heat is applied to the nerve that carries the pain signal. The heat damages the nerve and interferes with the pain signal. Pain relief usually starts about 2 weeks after the procedure and lasts for 6 months to 1 year. Tell a health care provider about: Any allergies you have. All medicines you are taking, including vitamins, herbs, eye drops, creams, and over-the-counter medicines. Any problems you or family members have had with anesthetic medicines. Any bleeding problems you have. Any surgeries you have had. Any medical conditions you have. Whether you are pregnant or may be pregnant. What are the risks? Generally, this is a safe procedure. However, problems may occur, including: Pain or soreness at the injection site. Allergic reaction to medicines given during the procedure. Bleeding. Infection at the injection site. Damage to nerves or blood vessels. What happens before the procedure? When to stop eating and drinking Follow instructions from your health care provider about what you may eat and drink before your procedure. These may include: 8 hours before the procedure Stop eating most foods. Do not eat meat, fried foods, or fatty foods. Eat only light foods,  such as toast or crackers. All liquids are okay except energy drinks and alcohol. 6 hours before the procedure Stop eating. Drink only clear liquids, such as water, clear fruit juice, black coffee, plain tea, and sports drinks. Do not drink energy drinks or alcohol. 2 hours before the procedure Stop drinking all liquids. You may be allowed to take medicine with small sips of water. If you do not follow your health care provider's instructions, your procedure may be delayed or canceled. Medicines Ask your health care provider about: Changing or stopping your regular medicines. This is especially important if you are taking diabetes medicines or blood thinners. Taking medicines such as aspirin and ibuprofen. These medicines can thin your blood. Do not take these medicines unless your health care provider tells you to take them. Taking over-the-counter medicines, vitamins, herbs, and supplements. General instructions Ask your health care provider what steps will be taken to help prevent infection. These steps may include: Removing hair at the procedure site. Washing skin with a germ-killing soap. Taking antibiotic medicine. If you will be going home right after the procedure, plan to have a responsible adult: Take you home from the hospital or clinic. You will not be allowed to drive. Care for you for the time you are told. What happens during the procedure?  You will be awake during the procedure. You will need to be able to talk with the health care provider during the procedure. An IV will be inserted into one of your veins. You will be given one or more of the following: A medicine to help you relax (sedative). A medicine to numb the area (local anesthetic). Your health care provider will insert a radiofrequency needle into the area to be treated. This is done with the help of fluoroscopy. A wire that carries the radio waves (electrode) will be put through the radiofrequency  needle. An electrical pulse will be sent through the electrode to verify the correct nerve that is causing your pain. You will feel a tingling sensation, and you may have muscle twitching. The tissue around the needle tip will be heated by an electric current that comes from the radiofrequency machine. This will numb the nerves.   The needle will be removed. A bandage (dressing) will be put on the insertion area. The procedure may vary among health care providers and hospitals. What happens after the procedure? Your blood pressure, heart rate, breathing rate, and blood oxygen level will be monitored until you leave the hospital or clinic. Return to your normal activities as told by your health care provider. Ask your health care provider what activities are safe for you. If you were given a sedative during the procedure, it can affect you for several hours. Do not drive or operate machinery until your health care provider says that it is safe. Summary Radiofrequency ablation is a procedure that is performed to relieve pain. The procedure is often used for back, neck, or arm pain. Radiofrequency ablation involves the use of a machine that creates radio waves to make heat. Plan to have a responsible adult take you home from the hospital or clinic. Do not drive or operate machinery until your health care provider says that it is safe. Return to your normal activities as told by your health care provider. Ask your health care provider what activities are safe for you. This information is not intended to replace advice given to you by your health care provider. Make sure you discuss any questions you have with your health care provider. Document Revised: 07/13/2020 Document Reviewed: 07/13/2020 Else[employer] Patient Education  2024 [employer]  
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
## 2023-07-12 NOTE — Progress Notes (Signed)
 Safety precautions to be maintained throughout the outpatient stay will include: orient to surroundings, keep bed in low position, maintain call bell within reach at all times, provide assistance with transfer out of bed and ambulation.

## 2023-07-16 ENCOUNTER — Ambulatory Visit: Admitting: Student in an Organized Health Care Education/Training Program

## 2023-07-18 ENCOUNTER — Ambulatory Visit: Admitting: Student in an Organized Health Care Education/Training Program

## 2023-07-18 ENCOUNTER — Ambulatory Visit

## 2023-07-19 ENCOUNTER — Encounter: Payer: Self-pay | Admitting: Orthopaedic Surgery
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
## 2023-07-20 ENCOUNTER — Encounter: Payer: Self-pay | Admitting: Occupational Therapy

## 2023-07-20 ENCOUNTER — Ambulatory Visit: Attending: Physician Assistant | Admitting: Occupational Therapy

## 2023-07-20 DIAGNOSIS — M25631 Stiffness of right wrist, not elsewhere classified: Secondary | ICD-10-CM | POA: Insufficient documentation

## 2023-07-20 DIAGNOSIS — M6281 Muscle weakness (generalized): Secondary | ICD-10-CM | POA: Diagnosis not present

## 2023-07-20 DIAGNOSIS — L905 Scar conditions and fibrosis of skin: Secondary | ICD-10-CM | POA: Diagnosis not present

## 2023-07-20 DIAGNOSIS — M79641 Pain in right hand: Secondary | ICD-10-CM | POA: Diagnosis not present

## 2023-07-20 DIAGNOSIS — M25641 Stiffness of right hand, not elsewhere classified: Secondary | ICD-10-CM | POA: Diagnosis not present

## 2023-07-20 DIAGNOSIS — M1811 Unilateral primary osteoarthritis of first carpometacarpal joint, right hand: Secondary | ICD-10-CM | POA: Insufficient documentation

## 2023-07-24 ENCOUNTER — Ambulatory Visit

## 2023-07-25 ENCOUNTER — Other Ambulatory Visit: Payer: Self-pay | Admitting: Physician Assistant

## 2023-07-25 ENCOUNTER — Telehealth: Payer: Self-pay | Admitting: Physician Assistant

## 2023-07-25 ENCOUNTER — Ambulatory Visit (INDEPENDENT_AMBULATORY_CARE_PROVIDER_SITE_OTHER): Admitting: Physician Assistant

## 2023-07-25 DIAGNOSIS — M180 Bilateral primary osteoarthritis of first carpometacarpal joints: Secondary | ICD-10-CM

## 2023-07-25 MED ORDER — HYDROCODONE-ACETAMINOPHEN 5-325 MG PO TABS: 1.0000 | ORAL_TABLET | Freq: Two times a day (BID) | ORAL | 0 refills | Status: DC | PRN
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
## 2023-07-25 NOTE — Telephone Encounter (Signed)
 Pt called to check on the status of a refill prescription that was supposed to be sent in today after visit . Best call back [phone]

## 2023-07-25 NOTE — Telephone Encounter (Signed)
 sent

## 2023-07-26 ENCOUNTER — Ambulatory Visit: Admitting: Occupational Therapy

## 2023-07-30 ENCOUNTER — Encounter

## 2023-07-31 ENCOUNTER — Ambulatory Visit: Admitting: Occupational Therapy

## 2023-07-31 DIAGNOSIS — M79641 Pain in right hand: Secondary | ICD-10-CM | POA: Diagnosis not present

## 2023-07-31 DIAGNOSIS — M25641 Stiffness of right hand, not elsewhere classified: Secondary | ICD-10-CM | POA: Diagnosis not present

## 2023-07-31 DIAGNOSIS — M25631 Stiffness of right wrist, not elsewhere classified: Secondary | ICD-10-CM | POA: Diagnosis not present

## 2023-07-31 DIAGNOSIS — M1811 Unilateral primary osteoarthritis of first carpometacarpal joint, right hand: Secondary | ICD-10-CM | POA: Diagnosis not present

## 2023-07-31 DIAGNOSIS — L905 Scar conditions and fibrosis of skin: Secondary | ICD-10-CM | POA: Diagnosis not present

## 2023-07-31 DIAGNOSIS — M6281 Muscle weakness (generalized): Secondary | ICD-10-CM | POA: Diagnosis not present
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
## 2023-08-01 ENCOUNTER — Ambulatory Visit: Admitting: Student in an Organized Health Care Education/Training Program

## 2023-08-01 DIAGNOSIS — M25562 Pain in left knee: Secondary | ICD-10-CM | POA: Diagnosis not present

## 2023-08-01 DIAGNOSIS — G8929 Other chronic pain: Secondary | ICD-10-CM | POA: Diagnosis not present

## 2023-08-01 DIAGNOSIS — M7652 Patellar tendinitis, left knee: Secondary | ICD-10-CM | POA: Diagnosis not present

## 2023-08-01 DIAGNOSIS — S82025A Nondisplaced longitudinal fracture of left patella, initial encounter for closed fracture: Secondary | ICD-10-CM | POA: Diagnosis not present

## 2023-08-06 DIAGNOSIS — M25562 Pain in left knee: Secondary | ICD-10-CM | POA: Diagnosis not present

## 2023-08-07 ENCOUNTER — Ambulatory Visit: Admitting: Occupational Therapy

## 2023-08-08 ENCOUNTER — Encounter

## 2023-08-14 ENCOUNTER — Ambulatory Visit: Attending: Physician Assistant | Admitting: Occupational Therapy

## 2023-08-14 ENCOUNTER — Encounter: Payer: Self-pay | Admitting: Occupational Therapy

## 2023-08-14 ENCOUNTER — Encounter

## 2023-08-14 DIAGNOSIS — M6281 Muscle weakness (generalized): Secondary | ICD-10-CM | POA: Insufficient documentation

## 2023-08-14 DIAGNOSIS — M79641 Pain in right hand: Secondary | ICD-10-CM | POA: Diagnosis not present

## 2023-08-14 DIAGNOSIS — M25641 Stiffness of right hand, not elsewhere classified: Secondary | ICD-10-CM | POA: Diagnosis not present

## 2023-08-14 DIAGNOSIS — L905 Scar conditions and fibrosis of skin: Secondary | ICD-10-CM | POA: Diagnosis not present

## 2023-08-14 DIAGNOSIS — M25631 Stiffness of right wrist, not elsewhere classified: Secondary | ICD-10-CM | POA: Insufficient documentation
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
## 2023-08-15 DIAGNOSIS — M25562 Pain in left knee: Secondary | ICD-10-CM | POA: Diagnosis not present

## 2023-08-16 ENCOUNTER — Encounter

## 2023-08-21 ENCOUNTER — Ambulatory Visit: Admitting: Occupational Therapy

## 2023-08-21 ENCOUNTER — Encounter

## 2023-08-21 DIAGNOSIS — M25641 Stiffness of right hand, not elsewhere classified: Secondary | ICD-10-CM

## 2023-08-21 DIAGNOSIS — M6281 Muscle weakness (generalized): Secondary | ICD-10-CM

## 2023-08-21 DIAGNOSIS — M25631 Stiffness of right wrist, not elsewhere classified: Secondary | ICD-10-CM | POA: Diagnosis not present

## 2023-08-21 DIAGNOSIS — M79641 Pain in right hand: Secondary | ICD-10-CM

## 2023-08-21 DIAGNOSIS — L905 Scar conditions and fibrosis of skin: Secondary | ICD-10-CM
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
## 2023-08-22 ENCOUNTER — Encounter: Payer: Self-pay | Admitting: Student in an Organized Health Care Education/Training Program

## 2023-08-22 ENCOUNTER — Other Ambulatory Visit: Payer: Self-pay | Admitting: Nurse Practitioner

## 2023-08-22 DIAGNOSIS — M25562 Pain in left knee: Secondary | ICD-10-CM | POA: Diagnosis not present

## 2023-08-23 ENCOUNTER — Other Ambulatory Visit: Payer: Self-pay | Admitting: Nurse Practitioner

## 2023-08-23 ENCOUNTER — Encounter

## 2023-08-23 DIAGNOSIS — G8929 Other chronic pain: Secondary | ICD-10-CM

## 2023-08-23 DIAGNOSIS — G894 Chronic pain syndrome: Secondary | ICD-10-CM

## 2023-08-28 ENCOUNTER — Ambulatory Visit: Admitting: Occupational Therapy

## 2023-08-28 ENCOUNTER — Encounter: Payer: Self-pay | Admitting: Occupational Therapy

## 2023-08-28 ENCOUNTER — Encounter

## 2023-08-28 DIAGNOSIS — L905 Scar conditions and fibrosis of skin: Secondary | ICD-10-CM | POA: Diagnosis not present

## 2023-08-28 DIAGNOSIS — M79641 Pain in right hand: Secondary | ICD-10-CM | POA: Diagnosis not present

## 2023-08-28 DIAGNOSIS — M6281 Muscle weakness (generalized): Secondary | ICD-10-CM

## 2023-08-28 DIAGNOSIS — M25641 Stiffness of right hand, not elsewhere classified: Secondary | ICD-10-CM

## 2023-08-28 DIAGNOSIS — M25631 Stiffness of right wrist, not elsewhere classified: Secondary | ICD-10-CM
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
## 2023-08-29 DIAGNOSIS — M25562 Pain in left knee: Secondary | ICD-10-CM | POA: Diagnosis not present

## 2023-08-30 ENCOUNTER — Encounter

## 2023-08-31 ENCOUNTER — Ambulatory Visit: Admitting: Occupational Therapy

## 2023-08-31 DIAGNOSIS — M25631 Stiffness of right wrist, not elsewhere classified: Secondary | ICD-10-CM

## 2023-08-31 DIAGNOSIS — L905 Scar conditions and fibrosis of skin: Secondary | ICD-10-CM

## 2023-08-31 DIAGNOSIS — M6281 Muscle weakness (generalized): Secondary | ICD-10-CM | POA: Diagnosis not present

## 2023-08-31 DIAGNOSIS — M79641 Pain in right hand: Secondary | ICD-10-CM

## 2023-08-31 DIAGNOSIS — M25641 Stiffness of right hand, not elsewhere classified: Secondary | ICD-10-CM | POA: Diagnosis not present
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
## 2023-09-03 ENCOUNTER — Ambulatory Visit: Admitting: Occupational Therapy

## 2023-09-03 DIAGNOSIS — M79641 Pain in right hand: Secondary | ICD-10-CM | POA: Diagnosis not present

## 2023-09-03 DIAGNOSIS — L905 Scar conditions and fibrosis of skin: Secondary | ICD-10-CM | POA: Diagnosis not present

## 2023-09-03 DIAGNOSIS — M6281 Muscle weakness (generalized): Secondary | ICD-10-CM | POA: Diagnosis not present

## 2023-09-03 DIAGNOSIS — M25641 Stiffness of right hand, not elsewhere classified: Secondary | ICD-10-CM

## 2023-09-03 DIAGNOSIS — M25631 Stiffness of right wrist, not elsewhere classified: Secondary | ICD-10-CM | POA: Diagnosis not present
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
## 2023-09-04 ENCOUNTER — Encounter

## 2023-09-05 DIAGNOSIS — M25562 Pain in left knee: Secondary | ICD-10-CM | POA: Diagnosis not present

## 2023-09-06 ENCOUNTER — Encounter

## 2023-09-06 ENCOUNTER — Ambulatory Visit (INDEPENDENT_AMBULATORY_CARE_PROVIDER_SITE_OTHER): Admitting: Physician Assistant

## 2023-09-06 ENCOUNTER — Ambulatory Visit: Admitting: Occupational Therapy

## 2023-09-06 ENCOUNTER — Encounter: Payer: Self-pay | Admitting: Physician Assistant

## 2023-09-06 DIAGNOSIS — M180 Bilateral primary osteoarthritis of first carpometacarpal joints: Secondary | ICD-10-CM

## 2023-09-06 DIAGNOSIS — M6281 Muscle weakness (generalized): Secondary | ICD-10-CM

## 2023-09-06 DIAGNOSIS — L905 Scar conditions and fibrosis of skin: Secondary | ICD-10-CM | POA: Diagnosis not present

## 2023-09-06 DIAGNOSIS — M25641 Stiffness of right hand, not elsewhere classified: Secondary | ICD-10-CM

## 2023-09-06 DIAGNOSIS — M79641 Pain in right hand: Secondary | ICD-10-CM | POA: Diagnosis not present

## 2023-09-06 DIAGNOSIS — G5601 Carpal tunnel syndrome, right upper limb: Secondary | ICD-10-CM | POA: Diagnosis not present

## 2023-09-06 DIAGNOSIS — M25631 Stiffness of right wrist, not elsewhere classified: Secondary | ICD-10-CM

## 2023-09-06 MED ORDER — TRAMADOL HCL 50 MG PO TABS: 50.0000 mg | ORAL_TABLET | Freq: Two times a day (BID) | ORAL | 2 refills | Status: DC | PRN
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
## 2023-09-07 ENCOUNTER — Encounter: Admitting: Occupational Therapy

## 2023-09-07 DIAGNOSIS — W5503XA Scratched by cat, initial encounter: Secondary | ICD-10-CM | POA: Diagnosis not present

## 2023-09-07 DIAGNOSIS — S0081XA Abrasion of other part of head, initial encounter: Secondary | ICD-10-CM | POA: Diagnosis not present

## 2023-09-11 ENCOUNTER — Encounter

## 2023-09-11 ENCOUNTER — Ambulatory Visit: Attending: Physician Assistant | Admitting: Occupational Therapy

## 2023-09-11 DIAGNOSIS — L905 Scar conditions and fibrosis of skin: Secondary | ICD-10-CM | POA: Insufficient documentation

## 2023-09-11 DIAGNOSIS — M79641 Pain in right hand: Secondary | ICD-10-CM | POA: Diagnosis not present

## 2023-09-11 DIAGNOSIS — M6281 Muscle weakness (generalized): Secondary | ICD-10-CM | POA: Diagnosis not present

## 2023-09-11 DIAGNOSIS — M25631 Stiffness of right wrist, not elsewhere classified: Secondary | ICD-10-CM | POA: Diagnosis not present

## 2023-09-11 DIAGNOSIS — M25641 Stiffness of right hand, not elsewhere classified: Secondary | ICD-10-CM | POA: Diagnosis not present
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
## 2023-09-12 DIAGNOSIS — Z96652 Presence of left artificial knee joint: Secondary | ICD-10-CM | POA: Diagnosis not present

## 2023-09-12 DIAGNOSIS — M25562 Pain in left knee: Secondary | ICD-10-CM | POA: Diagnosis not present

## 2023-09-12 DIAGNOSIS — G8929 Other chronic pain: Secondary | ICD-10-CM | POA: Diagnosis not present

## 2023-09-12 DIAGNOSIS — M25561 Pain in right knee: Secondary | ICD-10-CM | POA: Diagnosis not present

## 2023-09-13 ENCOUNTER — Encounter

## 2023-09-13 ENCOUNTER — Encounter: Admitting: Occupational Therapy

## 2023-09-18 ENCOUNTER — Encounter

## 2023-09-18 ENCOUNTER — Ambulatory Visit: Admitting: Occupational Therapy

## 2023-09-18 DIAGNOSIS — M79641 Pain in right hand: Secondary | ICD-10-CM | POA: Diagnosis not present

## 2023-09-18 DIAGNOSIS — L905 Scar conditions and fibrosis of skin: Secondary | ICD-10-CM | POA: Diagnosis not present

## 2023-09-18 DIAGNOSIS — M6281 Muscle weakness (generalized): Secondary | ICD-10-CM

## 2023-09-18 DIAGNOSIS — M25641 Stiffness of right hand, not elsewhere classified: Secondary | ICD-10-CM

## 2023-09-18 DIAGNOSIS — M25631 Stiffness of right wrist, not elsewhere classified: Secondary | ICD-10-CM
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
## 2023-09-20 ENCOUNTER — Encounter: Admitting: Occupational Therapy

## 2023-09-20 ENCOUNTER — Ambulatory Visit: Admitting: Occupational Therapy

## 2023-09-20 DIAGNOSIS — M79641 Pain in right hand: Secondary | ICD-10-CM | POA: Diagnosis not present

## 2023-09-20 DIAGNOSIS — M25631 Stiffness of right wrist, not elsewhere classified: Secondary | ICD-10-CM | POA: Diagnosis not present

## 2023-09-20 DIAGNOSIS — L905 Scar conditions and fibrosis of skin: Secondary | ICD-10-CM

## 2023-09-20 DIAGNOSIS — M25641 Stiffness of right hand, not elsewhere classified: Secondary | ICD-10-CM

## 2023-09-20 DIAGNOSIS — M6281 Muscle weakness (generalized): Secondary | ICD-10-CM

## 2023-09-24 ENCOUNTER — Ambulatory Visit: Admitting: Occupational Therapy

## 2023-09-25 ENCOUNTER — Encounter

## 2023-09-26 DIAGNOSIS — M5441 Lumbago with sciatica, right side: Secondary | ICD-10-CM | POA: Diagnosis not present

## 2023-09-26 DIAGNOSIS — G8929 Other chronic pain: Secondary | ICD-10-CM | POA: Diagnosis not present

## 2023-09-26 DIAGNOSIS — Z09 Encounter for follow-up examination after completed treatment for conditions other than malignant neoplasm: Secondary | ICD-10-CM | POA: Diagnosis not present

## 2023-09-27 ENCOUNTER — Encounter

## 2023-09-27 ENCOUNTER — Ambulatory Visit: Admitting: Occupational Therapy

## 2023-10-02 ENCOUNTER — Ambulatory Visit: Admitting: Occupational Therapy

## 2023-10-02 ENCOUNTER — Encounter

## 2023-10-02 DIAGNOSIS — M79641 Pain in right hand: Secondary | ICD-10-CM

## 2023-10-02 DIAGNOSIS — M25641 Stiffness of right hand, not elsewhere classified: Secondary | ICD-10-CM | POA: Diagnosis not present

## 2023-10-02 DIAGNOSIS — M25631 Stiffness of right wrist, not elsewhere classified: Secondary | ICD-10-CM | POA: Diagnosis not present

## 2023-10-02 DIAGNOSIS — M6281 Muscle weakness (generalized): Secondary | ICD-10-CM

## 2023-10-02 DIAGNOSIS — L905 Scar conditions and fibrosis of skin: Secondary | ICD-10-CM
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
## 2023-10-04 ENCOUNTER — Encounter: Admitting: Occupational Therapy

## 2023-10-09 ENCOUNTER — Encounter: Admitting: Occupational Therapy

## 2023-10-11 ENCOUNTER — Encounter: Admitting: Occupational Therapy

## 2023-10-16 ENCOUNTER — Encounter

## 2023-10-16 DIAGNOSIS — G8929 Other chronic pain: Secondary | ICD-10-CM | POA: Diagnosis not present

## 2023-10-16 DIAGNOSIS — M5441 Lumbago with sciatica, right side: Secondary | ICD-10-CM | POA: Diagnosis not present

## 2023-10-18 ENCOUNTER — Encounter

## 2023-10-18 ENCOUNTER — Ambulatory Visit: Attending: Physician Assistant | Admitting: Occupational Therapy

## 2023-10-18 DIAGNOSIS — M79641 Pain in right hand: Secondary | ICD-10-CM | POA: Diagnosis not present

## 2023-10-18 DIAGNOSIS — M6281 Muscle weakness (generalized): Secondary | ICD-10-CM | POA: Diagnosis not present

## 2023-10-18 DIAGNOSIS — M25641 Stiffness of right hand, not elsewhere classified: Secondary | ICD-10-CM | POA: Insufficient documentation

## 2023-10-18 DIAGNOSIS — M25631 Stiffness of right wrist, not elsewhere classified: Secondary | ICD-10-CM | POA: Insufficient documentation

## 2023-10-18 DIAGNOSIS — L905 Scar conditions and fibrosis of skin: Secondary | ICD-10-CM | POA: Insufficient documentation
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
## 2023-10-19 ENCOUNTER — Ambulatory Visit: Admitting: Orthopaedic Surgery

## 2023-10-19 ENCOUNTER — Encounter: Payer: Self-pay | Admitting: Orthopaedic Surgery

## 2023-10-19 DIAGNOSIS — M180 Bilateral primary osteoarthritis of first carpometacarpal joints: Secondary | ICD-10-CM | POA: Diagnosis not present
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
## 2023-10-23 ENCOUNTER — Encounter

## 2023-10-25 ENCOUNTER — Encounter

## 2023-10-30 ENCOUNTER — Encounter

## 2023-11-01 ENCOUNTER — Encounter

## 2023-11-01 DIAGNOSIS — M6281 Muscle weakness (generalized): Secondary | ICD-10-CM | POA: Diagnosis not present

## 2023-11-01 DIAGNOSIS — M5441 Lumbago with sciatica, right side: Secondary | ICD-10-CM | POA: Diagnosis not present

## 2023-11-01 DIAGNOSIS — G8929 Other chronic pain: Secondary | ICD-10-CM | POA: Diagnosis not present

## 2023-11-06 ENCOUNTER — Encounter

## 2023-11-06 DIAGNOSIS — M5441 Lumbago with sciatica, right side: Secondary | ICD-10-CM | POA: Diagnosis not present

## 2023-11-06 DIAGNOSIS — G8929 Other chronic pain: Secondary | ICD-10-CM | POA: Diagnosis not present

## 2023-11-07 DIAGNOSIS — G8929 Other chronic pain: Secondary | ICD-10-CM | POA: Diagnosis not present

## 2023-11-07 DIAGNOSIS — M5441 Lumbago with sciatica, right side: Secondary | ICD-10-CM | POA: Diagnosis not present

## 2023-11-08 ENCOUNTER — Encounter

## 2023-11-13 ENCOUNTER — Encounter

## 2023-11-15 ENCOUNTER — Encounter: Admitting: Occupational Therapy

## 2023-11-16 ENCOUNTER — Ambulatory Visit: Admission: RE | Admit: 2023-11-16 | Discharge: 2023-11-16 | Disposition: A | Discharge status: Home / Self Care

## 2023-11-16 VITALS — BP 128/82 | HR 76 | Temp 97.8°F | Resp 18 | Wt 319.0 lb

## 2023-11-16 DIAGNOSIS — M25473 Effusion, unspecified ankle: Secondary | ICD-10-CM | POA: Diagnosis not present

## 2023-11-16 DIAGNOSIS — M7989 Other specified soft tissue disorders: Secondary | ICD-10-CM | POA: Diagnosis not present
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
## 2023-11-16 NOTE — ED Triage Notes (Signed)
 My ankle is swollen. Both of them are, the right one is worse. This has been happening for over a week now. My knees also are aching. It's extremely difficult to stand up without pain. - Entered by patient  Pt reports she elevates the ankles to get the swelling down but that only works temporarily. Pt also reports she fell last week but she had more pain in her knees than her ankles. Sxs onset about 2 weeks ago.

## 2023-11-16 NOTE — Discharge Instructions (Signed)
 Take home meds Rest,elevate legs Wear compression hose, good supportive shoes, avoid slides and flip flops Follow up with PCP/Orthopedics If you have new or worsening symptoms go to emergency room for further evaluation

## 2023-11-20 ENCOUNTER — Encounter

## 2023-11-22 ENCOUNTER — Encounter: Admitting: Occupational Therapy

## 2023-11-28 DIAGNOSIS — Z96652 Presence of left artificial knee joint: Secondary | ICD-10-CM | POA: Diagnosis not present

## 2023-11-28 DIAGNOSIS — T8484XA Pain due to internal orthopedic prosthetic devices, implants and grafts, initial encounter: Secondary | ICD-10-CM | POA: Diagnosis not present

## 2023-11-28 DIAGNOSIS — M2352 Chronic instability of knee, left knee: Secondary | ICD-10-CM | POA: Diagnosis not present

## 2023-12-10 ENCOUNTER — Encounter: Payer: Self-pay | Admitting: Radiology

## 2024-01-01 ENCOUNTER — Other Ambulatory Visit
Admission: RE | Admit: 2024-01-01 | Discharge: 2024-01-01 | Disposition: A | Discharge status: Home / Self Care | Source: Ambulatory Visit | Attending: Orthopedic Surgery | Admitting: Orthopedic Surgery

## 2024-01-01 DIAGNOSIS — T8484XA Pain due to internal orthopedic prosthetic devices, implants and grafts, initial encounter: Secondary | ICD-10-CM | POA: Insufficient documentation

## 2024-01-01 DIAGNOSIS — Z96652 Presence of left artificial knee joint: Secondary | ICD-10-CM | POA: Insufficient documentation

## 2024-01-01 LAB — SYNOVIAL CELL COUNT + DIFF, W/ CRYSTALS
Crystals, Fluid: NONE SEEN
Eosinophils-Synovial: 0 %
Lymphocytes-Synovial Fld: 52 %
Monocyte-Macrophage-Synovial Fluid: 45 %
Neutrophil, Synovial: 3 %
WBC, Synovial: 484 /mm3 — ABNORMAL HIGH (ref 0–200)

## 2024-01-07 ENCOUNTER — Other Ambulatory Visit: Payer: Self-pay | Admitting: Nurse Practitioner

## 2024-01-07 DIAGNOSIS — Z1231 Encounter for screening mammogram for malignant neoplasm of breast: Secondary | ICD-10-CM

## 2024-01-10 ENCOUNTER — Ambulatory Visit

## 2024-01-10 DIAGNOSIS — L309 Dermatitis, unspecified: Secondary | ICD-10-CM | POA: Diagnosis not present

## 2024-01-10 DIAGNOSIS — L239 Allergic contact dermatitis, unspecified cause: Secondary | ICD-10-CM

## 2024-01-10 MED ORDER — TACROLIMUS 0.1 % EX OINT: TOPICAL_OINTMENT | Freq: Two times a day (BID) | CUTANEOUS | 0 refills | Status: DC

## 2024-01-10 NOTE — Patient Instructions (Addendum)
 Moisturizer: Apply a moisturizer throughout the day and after bathing.  When you moisturize after bathing, this locks in the moisture.  This can lead to softer and smoother skin.  Body moisturizers come in ointments, creams, and lotions.  If you have dry skin, we recommend the use of ointments or creams rather than lotions.  In other words, something you scoop out of a jar rather than squirted out.  Ointments and creams are thicker and thus provide better moisturization.      Moisturizers Apply a moisturizer to your skin at least once a day (even if you do not bathe).   Cool moisturizers on your skin help with itching (to accomplish this, place your moisturizers and medicated creams in the refrigerator). In general, people with dry skin need a moisturizer that is scooped and not squirted.  - Ointments (petrolatum ointment): greasy, but are the best moisturizers Vaseline, Aquaphor  - Creams (thick, white cream that comes in a jar and is scooped with your hand) Cerave, Cetaphil, Eucerin, Vanicream  - Lotions (comes in a pump) is the weakest moisturizer but is an acceptable choice for the face if you have an oily face Cetaphil, Cerave, Curel, Neutrogena, Lubriderm, Aveeno     Due to recent changes in healthcare laws, you may see results of your pathology and/or laboratory studies on MyChart before the doctors have had a chance to review them. We understand that in some cases there may be results that are confusing or concerning to you. Please understand that not all results are received at the same time and often the doctors may need to interpret multiple results in order to provide you with the best plan of care or course of treatment. Therefore, we ask that you please give us  2 business days to thoroughly review all your results before contacting the office for clarification. Should we see a critical lab result, you will be contacted sooner.   If You Need Anything After Your Visit  If  you have any questions or concerns for your doctor, please call our main line at [phone] and press option 4 to reach your doctor's medical assistant. If no one answers, please leave a voicemail as directed and we will return your call as soon as possible. Messages left after 4 pm will be answered the following business day.   You may also send us  a message via MyChart. We typically respond to MyChart messages within 1-2 business days.  For prescription refills, please ask your pharmacy to contact our office. Our fax number is [phone].  If you have an urgent issue when the clinic is closed that cannot wait until the next business day, you can page your doctor at the number below.    Please note that while we do our best to be available for urgent issues outside of office hours, we are not available 24/7.   If you have an urgent issue and are unable to reach us , you may choose to seek medical care at your doctor's office, retail clinic, urgent care center, or emergency room.  If you have a medical emergency, please immediately call 911 or go to the emergency department.  Pager Numbers  - Dr. Hester: [phone]  - Dr. Jackquline: [phone]  - Dr. Claudene: [phone]   - Dr. Raymund: [phone]  In the event of inclement weather, please call our main line at [phone] for an update on the status of any delays or closures.  Dermatology Medication Tips: Please keep the boxes  that topical medications come in in order to help keep track of the instructions about where and how to use these. Pharmacies typically print the medication instructions only on the boxes and not directly on the medication tubes.   If your medication is too expensive, please contact our office at [phone] option 4 or send us  a message through MyChart.   We are unable to tell what your co-pay for medications will be in advance as this is different depending on your insurance coverage. However, we may  be able to find a substitute medication at lower cost or fill out paperwork to get insurance to cover a needed medication.   If a prior authorization is required to get your medication covered by your insurance company, please allow us  1-2 business days to complete this process.  Drug prices often vary depending on where the prescription is filled and some pharmacies may offer cheaper prices.  The website www.goodrx.com contains coupons for medications through different pharmacies. The prices here do not account for what the cost may be with help from insurance (it may be cheaper with your insurance), but the website can give you the price if you did not use any insurance.  - You can print the associated coupon and take it with your prescription to the pharmacy.  - You may also stop by our office during regular business hours and pick up a GoodRx coupon card.  - If you need your prescription sent electronically to a different pharmacy, notify our office through [facility] or by phone at [phone] option 4.     Si Usted Necesita Algo Despus de Su Visita  Tambin puede enviarnos un mensaje a travs de Clinical cytogeneticist. Por lo general respondemos a los mensajes de MyChart en el transcurso de 1 a 2 das hbiles.  Para renovar recetas, por favor pida a su farmacia que se ponga en contacto con nuestra oficina. Randi lakes de fax es [place] [phone].  Si tiene un asunto urgente cuando la clnica est cerrada y que no puede esperar hasta el siguiente da hbil, puede llamar/localizar a su doctor(a) al nmero que aparece a continuacin.   Por favor, tenga en cuenta que aunque hacemos todo lo posible para estar disponibles para asuntos urgentes fuera del horario de [place], no estamos disponibles las 24 horas del da, los 7 [address]  [address] de la [place].   Si tiene un problema urgente y no puede comunicarse con nosotros, puede optar por buscar atencin mdica  en el consultorio de su doctor(a), en una  clnica privada, en un centro de atencin urgente o en una sala de emergencias.  Si tiene Engineer, drilling, por favor llame inmediatamente al 911 o vaya a la sala de emergencias.  Nmeros de bper  - Dr. Hester: [phone]  - Dra. Jackquline: [phone]  - Dr. Claudene: [phone]  - Dra. Kitts: [phone]  En caso de inclemencias del [place], por favor llame a nuestra lnea principal al [phone] para una actualizacin sobre el estado de cualquier retraso o cierre.  Consejos para la medicacin en dermatologa: Por favor, guarde las cajas en las que vienen los medicamentos de uso tpico para ayudarle a seguir las instrucciones sobre dnde y cmo usarlos. Las farmacias generalmente imprimen las instrucciones del medicamento slo en las cajas y no directamente en los tubos del [place].   Si su medicamento es muy caro, por favor, pngase en contacto con landry rieger llamando al [phone] y presione la opcin 4 o envenos un mensaje a  travs de MyChart.   No podemos decirle cul ser su copago por los medicamentos por adelantado ya que esto es diferente dependiendo de la cobertura de su seguro. Sin embargo, es posible que podamos encontrar un medicamento sustituto a Audiological scientist un formulario para que el seguro cubra el medicamento que se considera necesario.   Si se requiere una autorizacin previa para que su compaa de seguros malta su medicamento, por favor permtanos de 1 a 2 das hbiles para completar este proceso.  Los precios de los medicamentos varan con frecuencia dependiendo del Environmental consultant de dnde se surte la receta y alguna farmacias pueden ofrecer precios ms baratos.  El sitio web www.goodrx.com tiene cupones para medicamentos de Health and safety inspector. Los precios aqu no tienen en cuenta lo que podra costar con la ayuda del seguro (puede ser ms barato con su seguro), pero el sitio web puede darle el precio si no utiliz Tourist information centre manager.  - Puede  imprimir el cupn correspondiente y llevarlo con su receta a la farmacia.  - Tambin puede pasar por nuestra oficina durante el horario de atencin regular y Education officer, museum una tarjeta de cupones de GoodRx.  - Si necesita que su receta se enve electrnicamente a una farmacia diferente, informe a nuestra oficina a travs de MyChart de [place] o por telfono llamando al [phone] y presione la opcin 4.

## 2024-01-10 NOTE — Progress Notes (Signed)
    Subjective   Anna Barker is a 43 y.o. female who presents for the following: Rash under her eyes for ~ 2 days, skin extremely dry and irritating treating with otc Vaseline . Patient is new patient  Today patient reports: Above   Review of Systems:    No other skin or systemic complaints except as noted in HPI or Assessment and Plan.  The following portions of the chart were reviewed this encounter and updated as appropriate: medications, allergies, medical history  Relevant Medical History:  na  Objective  (SKPE) Well appearing patient in no apparent distress; mood and affect are within normal limits. Examination was performed of the: Focused Exam of: face   Examination notable for: Periorbital lichenified scaly plaques are the eyes    Assessment & Plan  (SKAP)   Allergic contact dermatitis periorbital vs less likely ICD  - Diagnosis, treatment options, prognosis, risk/ benefit, and side effects of treatment were discussed with the patient.  - Reviewed benign but chronic nature of disease. - Discussed dry skin care at length, recommended avoidance of fragrances, short showers with luke- warm water, no scrubbing, an unscented moisturizing soap (e.g. Dove sensitive skin) limited to the groin and axillae, and frequent emollient use (Eucerin, Aquaphor, Cerave, Vanicream, Vaseline). - Discussed treatment with topical steroids, non steroidal topicals, - Also discussed appropriate dry skin care including gentle soap, followed by liberal bland moisturizer application.  -- For areas on face: start tacrolimus 0.1% ointment twice daily -- May consider patch testing in the future   Procedures, orders, diagnosis for this visit:    There are no diagnoses linked to this encounter.  Return to clinic: No follow-ups on file.  IFay Kirks, CMA, am acting as scribe for Lauraine JAYSON Kanaris, MD .   Documentation: I have reviewed the above documentation for accuracy and completeness, and I  agree with the above.  Lauraine JAYSON Kanaris, MD

## 2024-02-20 ENCOUNTER — Ambulatory Visit
Admission: RE | Admit: 2024-02-20 | Discharge: 2024-02-20 | Disposition: A | Discharge status: Home / Self Care | Source: Ambulatory Visit | Attending: Nurse Practitioner | Admitting: Nurse Practitioner

## 2024-02-20 DIAGNOSIS — Z1231 Encounter for screening mammogram for malignant neoplasm of breast: Secondary | ICD-10-CM | POA: Insufficient documentation

## 2024-02-26 ENCOUNTER — Other Ambulatory Visit: Payer: Self-pay | Admitting: Orthopedic Surgery

## 2024-03-04 ENCOUNTER — Other Ambulatory Visit: Payer: Self-pay

## 2024-03-04 ENCOUNTER — Encounter
Admission: RE | Admit: 2024-03-04 | Discharge: 2024-03-04 | Disposition: A | Source: Ambulatory Visit | Attending: Orthopedic Surgery

## 2024-03-04 VITALS — BP 146/102 | HR 84 | Resp 16 | Ht 67.0 in | Wt 322.0 lb

## 2024-03-04 DIAGNOSIS — Z01818 Encounter for other preprocedural examination: Secondary | ICD-10-CM | POA: Insufficient documentation

## 2024-03-04 DIAGNOSIS — N926 Irregular menstruation, unspecified: Secondary | ICD-10-CM

## 2024-03-04 LAB — TYPE AND SCREEN
ABO/RH(D): B POS
Antibody Screen: NEGATIVE

## 2024-03-04 LAB — URINALYSIS, ROUTINE W REFLEX MICROSCOPIC
Bilirubin Urine: NEGATIVE
Glucose, UA: NEGATIVE mg/dL
Hgb urine dipstick: NEGATIVE
Ketones, ur: NEGATIVE mg/dL
Leukocytes,Ua: NEGATIVE
Nitrite: NEGATIVE
Protein, ur: NEGATIVE mg/dL
Specific Gravity, Urine: 1.013 (ref 1.005–1.030)
pH: 7 (ref 5.0–8.0)

## 2024-03-04 LAB — SURGICAL PCR SCREEN
MRSA, PCR: NEGATIVE
Staphylococcus aureus: NEGATIVE

## 2024-03-04 NOTE — Patient Instructions (Addendum)
 Your procedure is scheduled on: Thursday 03/13/24 To find out your arrival time, please call [phone] between 1PM - 3PM on: Wednesday 03/12/24 Report to the Registration Desk on the 1st floor of the [facility]. If your arrival time is 6:00 am, do not arrive before that time as the [facility] entrance doors do not open until 6:00 am.  REMEMBER: Instructions that are not followed completely may result in serious medical risk, up to and including death; or upon the discretion of your surgeon and anesthesiologist your surgery may need to be rescheduled.  Do not eat food after midnight the night before surgery.  No gum chewing or hard candies.  You may however, drink CLEAR liquids up to 2 hours before you are scheduled to arrive for your surgery. Do not drink anything within 2 hours of your scheduled arrival time.  Clear liquids include: - water  - apple juice without pulp - gatorade (not RED colors) - black coffee or tea (Do NOT add milk or creamers to the coffee or tea) Do NOT drink anything that is not on this list.  In addition, your doctor has ordered for you to drink the provided:  Ensure Pre-Surgery Clear Carbohydrate Drink  Drinking this carbohydrate drink up to two hours before surgery helps to reduce insulin resistance and improve patient outcomes. Please complete drinking 2 hours before scheduled arrival time.  One week prior to surgery: Stop Anti-inflammatories (NSAIDS) such as Advil , Aleve , Ibuprofen , Motrin , Naproxen , Naprosyn  and Aspirin based products such as Excedrin, Goody's Powder, BC Powder.  You may however, continue to take Tylenol  if needed for pain up until the day of surgery.  Stop ANY OVER THE COUNTER supplements and vitamins for at least 7 days until after surgery.  Continue taking all of your other prescription medications up until the day of surgery.  ON THE DAY OF SURGERY ONLY TAKE THESE MEDICATIONS WITH SIPS OF WATER:  amLODipine (NORVASC) 10 MG  tablet  omeprazole (PRILOSEC) 20 MG capsule   No Alcohol for 24 hours before or after surgery.  No Smoking including e-cigarettes for 24 hours before surgery.  No chewable tobacco products for at least 6 hours before surgery.  No nicotine patches on the day of surgery.  Do not use any recreational drugs for at least a week (preferably 2 weeks) before your surgery.  Please be advised that the combination of cocaine and anesthesia may have negative outcomes, up to and including death. If you test positive for cocaine, your surgery will be cancelled.  On the morning of surgery brush your teeth with toothpaste and water, you may rinse your mouth with mouthwash if you wish. Do not swallow any toothpaste or mouthwash.  Use CHG Soap or wipes as directed on instruction sheet. (You can pick this up at our office in the [facility], the building to the left of the [employer], [address] at [address]  Do not shave body hair from the neck down 48 hours before surgery.  Do not wear lotions, powders, or perfumes on the day of surgery  Wear comfortable clothing (specific to your surgery type) to the hospital.  Do not wear jewelry, make-up, hairpins, clips or nail polish.  For welded (permanent) jewelry: bracelets, anklets, waist bands, etc.  Please have this removed prior to surgery.  If it is not removed, there is a chance that hospital personnel will need to cut it off on the day of surgery.  Contact  lenses, hearing aids and dentures may not be worn into surgery. Bring a case for your glasses  Do not bring valuables to the hospital. [facility] is not responsible for any missing/lost belongings or valuables.   Notify your doctor if there is any change in your medical condition (cold, fever, infection).  After surgery, you can help prevent lung complications by doing breathing exercises.  Take deep breaths and cough every 1-2 hours. Your doctor may order a  device called an Incentive Spirometer to help you take deep breaths.  If you are being admitted to the hospital overnight, leave your suitcase in the car. After surgery it may be brought to your room.  In case of increased patient census, it may be necessary for you, the patient, to continue your postoperative care in the Same Day Surgery department.  Please call the Pre-admissions Testing Dept. at [phone] if you have any questions about these instructions.  Surgery Visitation Policy:  Patients having surgery or a procedure may have two visitors.  Children under the age of 92 must have an adult with them who is not the patient.  Inpatient Visitation:    Visiting hours are 7 a.m. to 8 p.m. Up to four visitors are allowed at one time in a patient room. The visitors may rotate out with other people during the day.  One visitor age 57 or older may stay with the patient overnight and must be in the room by 8 p.m.   Merchandiser, Retail to address health-related social needs:  [email or web address]    Pre-operative 4 CHG Bath Instructions   You can play a key role in reducing the risk of infection after surgery. Your skin needs to be as free of germs as possible. You can reduce the number of germs on your skin by washing with CHG (chlorhexidine  gluconate) soap before surgery. CHG is an antiseptic soap that kills germs and continues to kill germs even after washing.   DO NOT use if you have an allergy to chlorhexidine /CHG or antibacterial soaps. If your skin becomes reddened or irritated, stop using the CHG and notify one of our RNs at [phone].   Please shower with the CHG soap starting 4 days before surgery using the following schedule:   Sunday 03/09/24 - Wednesday 03/12/24    Please keep in mind the following:  DO NOT shave, including legs and underarms, starting the day of your first shower.   You may shave your face at any point before/day of surgery.   Place clean sheets on your bed the day you start using CHG soap. Use a clean washcloth (not used since being washed) for each shower. DO NOT sleep with pets once you start using the CHG.   CHG Shower Instructions:  If you choose to wash your hair and private area, wash first with your normal shampoo/soap.  After you use shampoo/soap, rinse your hair and body thoroughly to remove shampoo/soap residue.  Turn the water OFF and apply about 3 tablespoons (45 ml) of CHG soap to a CLEAN washcloth.  Apply CHG soap ONLY FROM YOUR NECK DOWN TO YOUR TOES (washing for 3-5 minutes)  DO NOT use CHG soap on face, private areas, open wounds, or sores.  Pay special attention to the area where your surgery is being performed.  If you are having back surgery, having someone wash your back for you may be helpful. Wait 2 minutes after CHG soap is applied, then you may rinse off the CHG  soap.  Pat dry with a clean towel  Put on clean clothes/pajamas   If you choose to wear lotion, please use ONLY the CHG-compatible lotions on the back of this paper.     Additional instructions for the day of surgery: DO NOT APPLY any lotions, deodorants, cologne, or perfumes.   Put on clean/comfortable clothes.  Brush your teeth.  Ask your nurse before applying any prescription medications to the skin.      CHG Compatible Lotions   Aveeno Moisturizing lotion  Cetaphil Moisturizing Cream  Cetaphil Moisturizing Lotion  Clairol Herbal Essence Moisturizing Lotion, Dry Skin  Clairol Herbal Essence Moisturizing Lotion, Extra Dry Skin  Clairol Herbal Essence Moisturizing Lotion, Normal Skin  Curel Age Defying Therapeutic Moisturizing Lotion with Alpha Hydroxy  Curel Extreme Care Body Lotion  Curel Soothing Hands Moisturizing Hand Lotion  Curel Therapeutic Moisturizing Cream, Fragrance-Free  Curel Therapeutic Moisturizing Lotion, Fragrance-Free  Curel Therapeutic Moisturizing Lotion, Original Formula  Eucerin Daily  Replenishing Lotion  Eucerin Dry Skin Therapy Plus Alpha Hydroxy Crme  Eucerin Dry Skin Therapy Plus Alpha Hydroxy Lotion  Eucerin Original Crme  Eucerin Original Lotion  Eucerin Plus Crme Eucerin Plus Lotion  Eucerin TriLipid Replenishing Lotion  Keri Anti-Bacterial Hand Lotion  Keri Deep Conditioning Original Lotion Dry Skin Formula Softly Scented  Keri Deep Conditioning Original Lotion, Fragrance Free Sensitive Skin Formula  Keri Lotion Fast Absorbing Fragrance Free Sensitive Skin Formula  Keri Lotion Fast Absorbing Softly Scented Dry Skin Formula  Keri Original Lotion  Keri Skin Renewal Lotion Keri Silky Smooth Lotion  Keri Silky Smooth Sensitive Skin Lotion  Nivea Body Creamy Conditioning Oil  Nivea Body Extra Enriched Lotion  Nivea Body Original Lotion  Nivea Body Sheer Moisturizing Lotion Nivea Crme  Nivea Skin Firming Lotion  NutraDerm 30 Skin Lotion  NutraDerm Skin Lotion  NutraDerm Therapeutic Skin Cream  NutraDerm Therapeutic Skin Lotion  ProShield Protective Hand Cream  Provon moisturizing lotion  How to Use an Incentive Spirometer  An incentive spirometer is a tool that measures how well you are filling your lungs with each breath. Learning to take long, deep breaths using this tool can help you keep your lungs clear and active. This may help to reverse or lessen your chance of developing breathing (pulmonary) problems, especially infection. You may be asked to use a spirometer: After a surgery. If you have a lung problem or a history of smoking. After a long period of time when you have been unable to move or be active. If the spirometer includes an indicator to show the highest number that you have reached, your health care provider or respiratory therapist will help you set a goal. Keep a log of your progress as told by your health care provider. What are the risks? Breathing too quickly may cause dizziness or cause you to pass out. Take your time so you do not  get dizzy or light-headed. If you are in pain, you may need to take pain medicine before doing incentive spirometry. It is harder to take a deep breath if you are having pain.   Sit up on the edge of your bed or on a chair. Hold the incentive spirometer so that it is in an upright position. Before you use the spirometer, breathe out normally. Place the mouthpiece in your mouth. Make sure your lips are closed tightly around it. Breathe in slowly and as deeply as you can through your mouth, causing the piston or the ball to rise toward the top  of the chamber. Hold your breath for 3-5 seconds, or for as long as possible. If the spirometer includes a coach indicator, use this to guide you in breathing. Slow down your breathing if the indicator goes above the marked areas. Remove the mouthpiece from your mouth and breathe out normally. The piston or ball will return to the bottom of the chamber. Rest for a few seconds, then repeat the steps 10 or more times. Take your time and take a few normal breaths between deep breaths so that you do not get dizzy or light-headed. Do this every 1-2 hours when you are awake. If the spirometer includes a goal marker to show the highest number you have reached (best effort), use this as a goal to work toward during each repetition. After each set of 10 deep breaths, cough a few times. This will help to make sure that your lungs are clear. If you have an incision on your chest or abdomen from surgery, place a pillow or a rolled-up towel firmly against the incision when you cough. This can help to reduce pain while taking deep breaths and coughing. General tips When you are able to get out of bed: Walk around often. Continue to take deep breaths and cough in order to clear your lungs. Keep using the incentive spirometer until your health care provider says it is okay to stop using it. If you have been in the hospital, you may be told to keep using the spirometer at  home. Contact a health care provider if: You are having difficulty using the spirometer. You have trouble using the spirometer as often as instructed. Your pain medicine is not giving enough relief for you to use the spirometer as told. You have a fever. Get help right away if: You develop shortness of breath. You develop a cough with bloody mucus from the lungs. You have fluid or blood coming from an incision site after you cough. Summary An incentive spirometer is a tool that can help you learn to take long, deep breaths to keep your lungs clear and active. You may be asked to use a spirometer after a surgery, if you have a lung problem or a history of smoking, or if you have been inactive for a long period of time. Use your incentive spirometer as instructed every 1-2 hours while you are awake. If you have an incision on your chest or abdomen, place a pillow or a rolled-up towel firmly against your incision when you cough. This will help to reduce pain. Get help right away if you have shortness of breath, you cough up bloody mucus, or blood comes from your incision when you cough. This information is not intended to replace advice given to you by your health care provider. Make sure you discuss any questions you have with your health care provider. Document Revised: 04/14/2019 Document Reviewed: 04/14/2019 Elsevier Patient Education  2023 [employer].

## 2024-03-05 MED ORDER — HYDROCORTISONE 2.5 % EX OINT: TOPICAL_OINTMENT | Freq: Two times a day (BID) | CUTANEOUS | 0 refills | Status: AC

## 2024-03-13 ENCOUNTER — Ambulatory Visit: Payer: Self-pay | Admitting: Urgent Care

## 2024-03-13 ENCOUNTER — Encounter: Payer: Self-pay | Admitting: Orthopedic Surgery

## 2024-03-13 ENCOUNTER — Other Ambulatory Visit: Payer: Self-pay

## 2024-03-13 ENCOUNTER — Encounter: Admission: RE | Disposition: A | Payer: Self-pay | Source: Ambulatory Visit | Attending: Orthopedic Surgery

## 2024-03-13 ENCOUNTER — Ambulatory Visit
Admission: RE | Admit: 2024-03-13 | Discharge: 2024-03-14 | Disposition: A | Source: Ambulatory Visit | Attending: Orthopedic Surgery | Admitting: Orthopedic Surgery

## 2024-03-13 ENCOUNTER — Encounter

## 2024-03-13 ENCOUNTER — Ambulatory Visit

## 2024-03-13 DIAGNOSIS — N926 Irregular menstruation, unspecified: Secondary | ICD-10-CM

## 2024-03-13 DIAGNOSIS — Z01818 Encounter for other preprocedural examination: Secondary | ICD-10-CM

## 2024-03-13 DIAGNOSIS — Z96652 Presence of left artificial knee joint: Secondary | ICD-10-CM

## 2024-03-13 LAB — POCT PREGNANCY, URINE: Preg Test, Ur: NEGATIVE

## 2024-03-13 MED ORDER — ENOXAPARIN SODIUM 30 MG/0.3ML IJ SOSY
30.0000 mg | PREFILLED_SYRINGE | Freq: Two times a day (BID) | INTRAMUSCULAR | Status: DC
  Administered 2024-03-14: 30 mg via SUBCUTANEOUS
  Filled 2024-03-13: qty 0.3

## 2024-03-13 MED ORDER — METOCLOPRAMIDE HCL 5 MG/ML IJ SOLN: 5.0000 mg | Freq: Three times a day (TID) | INTRAMUSCULAR | Status: DC | PRN

## 2024-03-13 MED ORDER — SODIUM CHLORIDE 0.9 % IR SOLN
Status: DC | PRN
  Administered 2024-03-13: 3000 mL

## 2024-03-13 MED ORDER — TRANEXAMIC ACID-NACL 1000-0.7 MG/100ML-% IV SOLN
INTRAVENOUS | Status: AC
  Filled 2024-03-13: qty 100

## 2024-03-13 MED ORDER — OXYCODONE HCL 5 MG PO TABS
5.0000 mg | ORAL_TABLET | Freq: Once | ORAL | Status: AC | PRN
  Administered 2024-03-13: 5 mg via ORAL

## 2024-03-13 MED ORDER — DEXAMETHASONE SOD PHOSPHATE PF 10 MG/ML IJ SOLN
INTRAMUSCULAR | Status: AC
  Filled 2024-03-13: qty 1

## 2024-03-13 MED ORDER — BUPIVACAINE HCL (PF) 0.5 % IJ SOLN
INTRAMUSCULAR | Status: DC | PRN
  Administered 2024-03-13: 3 mL via INTRATHECAL

## 2024-03-13 MED ORDER — CHLORHEXIDINE GLUCONATE 0.12 % MT SOLN
OROMUCOSAL | Status: AC
  Filled 2024-03-13: qty 15

## 2024-03-13 MED ORDER — MIDAZOLAM HCL 5 MG/5ML IJ SOLN
INTRAMUSCULAR | Status: DC | PRN
  Administered 2024-03-13 (×2): 1 mg via INTRAVENOUS

## 2024-03-13 MED ORDER — ACETAMINOPHEN 10 MG/ML IV SOLN
INTRAVENOUS | Status: AC
  Filled 2024-03-13: qty 100

## 2024-03-13 MED ORDER — DEXAMETHASONE SOD PHOSPHATE PF 10 MG/ML IJ SOLN
8.0000 mg | Freq: Once | INTRAMUSCULAR | Status: AC
  Administered 2024-03-13: 8 mg via INTRAVENOUS

## 2024-03-13 MED ORDER — PROPOFOL 1000 MG/100ML IV EMUL
INTRAVENOUS | Status: AC
  Filled 2024-03-13: qty 100

## 2024-03-13 MED ORDER — ONDANSETRON HCL 4 MG/2ML IJ SOLN
INTRAMUSCULAR | Status: DC | PRN
  Administered 2024-03-13: 4 mg via INTRAVENOUS

## 2024-03-13 MED ORDER — HYDROCHLOROTHIAZIDE 25 MG PO TABS
25.0000 mg | ORAL_TABLET | Freq: Every day | ORAL | Status: DC
  Administered 2024-03-14: 25 mg via ORAL
  Filled 2024-03-13: qty 1

## 2024-03-13 MED ORDER — HYDROCODONE-ACETAMINOPHEN 5-325 MG PO TABS
1.0000 | ORAL_TABLET | ORAL | Status: DC | PRN
  Administered 2024-03-13: 1 via ORAL
  Administered 2024-03-13 – 2024-03-14 (×4): 2 via ORAL
  Filled 2024-03-13 (×3): qty 2

## 2024-03-13 MED ORDER — SENNA 8.6 MG PO TABS
1.0000 | ORAL_TABLET | Freq: Every day | ORAL | Status: DC
  Administered 2024-03-13 – 2024-03-14 (×2): 8.6 mg via ORAL
  Filled 2024-03-13 (×2): qty 1

## 2024-03-13 MED ORDER — METHYLENE BLUE (ANTIDOTE) 1 % IV SOLN
INTRAVENOUS | Status: AC
  Filled 2024-03-13: qty 10

## 2024-03-13 MED ORDER — BUPIVACAINE LIPOSOME 1.3 % IJ SUSP
INTRAMUSCULAR | Status: AC
  Filled 2024-03-13: qty 20

## 2024-03-13 MED ORDER — CHLORHEXIDINE GLUCONATE 0.12 % MT SOLN
15.0000 mL | Freq: Once | OROMUCOSAL | Status: AC
  Administered 2024-03-13: 15 mL via OROMUCOSAL

## 2024-03-13 MED ORDER — PHENYLEPHRINE HCL-NACL 20-0.9 MG/250ML-% IV SOLN
INTRAVENOUS | Status: DC | PRN
  Administered 2024-03-13: 20 ug/min via INTRAVENOUS

## 2024-03-13 MED ORDER — MAGNESIUM OXIDE -MG SUPPLEMENT 400 (240 MG) MG PO TABS
400.0000 mg | ORAL_TABLET | Freq: Every day | ORAL | Status: DC
  Administered 2024-03-13 – 2024-03-14 (×2): 400 mg via ORAL
  Filled 2024-03-13 (×2): qty 1

## 2024-03-13 MED ORDER — MIDAZOLAM HCL 2 MG/2ML IJ SOLN
INTRAMUSCULAR | Status: AC
  Filled 2024-03-13: qty 2

## 2024-03-13 MED ORDER — KETAMINE HCL 50 MG/5ML IJ SOSY
PREFILLED_SYRINGE | INTRAMUSCULAR | Status: DC | PRN
  Administered 2024-03-13: 30 mg via INTRAVENOUS
  Administered 2024-03-13: 20 mg via INTRAVENOUS

## 2024-03-13 MED ORDER — ONDANSETRON HCL 4 MG/2ML IJ SOLN: 4.0000 mg | Freq: Four times a day (QID) | INTRAMUSCULAR | Status: DC | PRN

## 2024-03-13 MED ORDER — GLYCOPYRROLATE 0.2 MG/ML IJ SOLN
INTRAMUSCULAR | Status: AC
  Filled 2024-03-13: qty 1

## 2024-03-13 MED ORDER — METOCLOPRAMIDE HCL 5 MG/ML IJ SOLN: 10.0000 mg | Freq: Once | INTRAMUSCULAR | Status: DC | PRN

## 2024-03-13 MED ORDER — KETOROLAC TROMETHAMINE 15 MG/ML IJ SOLN
7.5000 mg | Freq: Four times a day (QID) | INTRAMUSCULAR | Status: AC
  Administered 2024-03-13 – 2024-03-14 (×4): 7.5 mg via INTRAVENOUS
  Filled 2024-03-13 (×3): qty 1

## 2024-03-13 MED ORDER — TRANEXAMIC ACID-NACL 1000-0.7 MG/100ML-% IV SOLN
1000.0000 mg | INTRAVENOUS | Status: AC
  Administered 2024-03-13 (×3): 1000 mg via INTRAVENOUS

## 2024-03-13 MED ORDER — SODIUM CHLORIDE (PF) 0.9 % IJ SOLN
INTRAMUSCULAR | Status: AC
  Filled 2024-03-13: qty 20

## 2024-03-13 MED ORDER — OXYCODONE HCL 5 MG PO TABS
ORAL_TABLET | ORAL | Status: AC
  Filled 2024-03-13: qty 1

## 2024-03-13 MED ORDER — ORAL CARE MOUTH RINSE: 15.0000 mL | Freq: Once | OROMUCOSAL | Status: DC

## 2024-03-13 MED ORDER — ONDANSETRON HCL 4 MG/2ML IJ SOLN
INTRAMUSCULAR | Status: AC
  Filled 2024-03-13: qty 2

## 2024-03-13 MED ORDER — PANTOPRAZOLE SODIUM 40 MG PO TBEC
40.0000 mg | DELAYED_RELEASE_TABLET | Freq: Every day | ORAL | Status: DC
  Administered 2024-03-13 – 2024-03-14 (×2): 40 mg via ORAL
  Filled 2024-03-13 (×2): qty 1

## 2024-03-13 MED ORDER — TRAZODONE HCL 100 MG PO TABS: 100.0000 mg | ORAL_TABLET | Freq: Every evening | ORAL | Status: DC | PRN

## 2024-03-13 MED ORDER — SURGIPHOR WOUND IRRIGATION SYSTEM - OPTIME: TOPICAL | Status: DC | PRN

## 2024-03-13 MED ORDER — DEXMEDETOMIDINE HCL IN NACL 80 MCG/20ML IV SOLN
INTRAVENOUS | Status: DC | PRN
  Administered 2024-03-13: 8 ug via INTRAVENOUS

## 2024-03-13 MED ORDER — DEXMEDETOMIDINE HCL IN NACL 80 MCG/20ML IV SOLN
INTRAVENOUS | Status: AC
  Filled 2024-03-13: qty 20

## 2024-03-13 MED ORDER — FENTANYL CITRATE (PF) 100 MCG/2ML IJ SOLN
INTRAMUSCULAR | Status: AC
  Filled 2024-03-13: qty 2

## 2024-03-13 MED ORDER — TRAMADOL HCL 50 MG PO TABS
50.0000 mg | ORAL_TABLET | Freq: Four times a day (QID) | ORAL | Status: DC | PRN
  Administered 2024-03-13 – 2024-03-14 (×2): 50 mg via ORAL
  Filled 2024-03-13 (×2): qty 1

## 2024-03-13 MED ORDER — KETOROLAC TROMETHAMINE 15 MG/ML IJ SOLN
INTRAMUSCULAR | Status: AC
  Filled 2024-03-13: qty 1

## 2024-03-13 MED ORDER — MORPHINE SULFATE (PF) 2 MG/ML IV SOLN: 0.5000 mg | INTRAVENOUS | Status: DC | PRN

## 2024-03-13 MED ORDER — LACTATED RINGERS IV SOLN: INTRAVENOUS | Status: DC

## 2024-03-13 MED ORDER — SODIUM CHLORIDE 0.9 % IV SOLN
3.0000 g | INTRAVENOUS | Status: AC
  Administered 2024-03-13: 3 g via INTRAVENOUS
  Filled 2024-03-13: qty 3

## 2024-03-13 MED ORDER — FENTANYL CITRATE (PF) 100 MCG/2ML IJ SOLN
INTRAMUSCULAR | Status: DC | PRN
  Administered 2024-03-13 (×2): 25 ug via INTRAVENOUS

## 2024-03-13 MED ORDER — BUPIVACAINE-EPINEPHRINE (PF) 0.25% -1:200000 IJ SOLN
INTRAMUSCULAR | Status: AC
  Filled 2024-03-13: qty 30

## 2024-03-13 MED ORDER — PROPOFOL 500 MG/50ML IV EMUL
INTRAVENOUS | Status: DC | PRN
  Administered 2024-03-13: 30 mg via INTRAVENOUS
  Administered 2024-03-13: 20 mg via INTRAVENOUS
  Administered 2024-03-13: 75 ug/kg/min via INTRAVENOUS
  Administered 2024-03-13: 30 mg via INTRAVENOUS
  Administered 2024-03-13 (×2): 20 mg via INTRAVENOUS
  Administered 2024-03-13: 30 mg via INTRAVENOUS
  Administered 2024-03-13: 20 mg via INTRAVENOUS

## 2024-03-13 MED ORDER — SODIUM CHLORIDE (PF) 0.9 % IJ SOLN
INTRAMUSCULAR | Status: DC | PRN
  Administered 2024-03-13: 71 mL via INTRAMUSCULAR

## 2024-03-13 MED ORDER — GLYCOPYRROLATE 0.2 MG/ML IJ SOLN
INTRAMUSCULAR | Status: DC | PRN
  Administered 2024-03-13: .1 mg via INTRAVENOUS

## 2024-03-13 MED ORDER — DROPERIDOL 2.5 MG/ML IJ SOLN: 0.6250 mg | Freq: Once | INTRAMUSCULAR | Status: DC | PRN

## 2024-03-13 MED ORDER — CEFAZOLIN SODIUM-DEXTROSE 3-4 GM/150ML-% IV SOLN
3.0000 g | Freq: Four times a day (QID) | INTRAVENOUS | Status: AC
  Administered 2024-03-13 (×2): 3 g via INTRAVENOUS
  Filled 2024-03-13 (×3): qty 150

## 2024-03-13 MED ORDER — HYDROMORPHONE HCL 1 MG/ML IJ SOLN: 0.2500 mg | INTRAMUSCULAR | Status: DC | PRN

## 2024-03-13 MED ORDER — KETAMINE HCL 50 MG/5ML IJ SOSY
PREFILLED_SYRINGE | INTRAMUSCULAR | Status: AC
  Filled 2024-03-13: qty 5

## 2024-03-13 MED ORDER — METHYLENE BLUE 20 MG/2ML IV SOSY
PREFILLED_SYRINGE | INTRAVENOUS | Status: DC | PRN
  Administered 2024-03-13: 1 mL

## 2024-03-13 MED ORDER — ACETAMINOPHEN 500 MG PO TABS
1000.0000 mg | ORAL_TABLET | Freq: Three times a day (TID) | ORAL | Status: DC
  Administered 2024-03-13: 1000 mg via ORAL
  Filled 2024-03-13: qty 2

## 2024-03-13 MED ORDER — 0.9 % SODIUM CHLORIDE (POUR BTL) OPTIME
TOPICAL | Status: DC | PRN
  Administered 2024-03-13: 500 mL

## 2024-03-13 MED ORDER — ONDANSETRON HCL 4 MG PO TABS: 4.0000 mg | ORAL_TABLET | Freq: Four times a day (QID) | ORAL | Status: DC | PRN

## 2024-03-13 MED ORDER — HYDROCODONE-ACETAMINOPHEN 5-325 MG PO TABS
ORAL_TABLET | ORAL | Status: AC
  Filled 2024-03-13: qty 1

## 2024-03-13 MED ORDER — ORAL CARE MOUTH RINSE: 15.0000 mL | Freq: Once | OROMUCOSAL | Status: AC

## 2024-03-13 MED ORDER — METOCLOPRAMIDE HCL 10 MG PO TABS: 5.0000 mg | ORAL_TABLET | Freq: Three times a day (TID) | ORAL | Status: DC | PRN

## 2024-03-13 MED ORDER — LIDOCAINE HCL (PF) 2 % IJ SOLN
INTRAMUSCULAR | Status: DC | PRN
  Administered 2024-03-13: 40 mg via INTRADERMAL

## 2024-03-13 MED ORDER — ACETAMINOPHEN 10 MG/ML IV SOLN
INTRAVENOUS | Status: DC | PRN
  Administered 2024-03-13: 1000 mg via INTRAVENOUS

## 2024-03-13 MED ORDER — MENTHOL 3 MG MT LOZG: 1.0000 | LOZENGE | OROMUCOSAL | Status: DC | PRN

## 2024-03-13 MED ORDER — CHLORHEXIDINE GLUCONATE 0.12 % MT SOLN: 15.0000 mL | Freq: Once | OROMUCOSAL | Status: DC

## 2024-03-13 MED ORDER — SODIUM CHLORIDE 0.9 % IV SOLN: INTRAVENOUS | Status: DC

## 2024-03-13 MED ORDER — ACETAMINOPHEN 325 MG PO TABS: 325.0000 mg | ORAL_TABLET | Freq: Four times a day (QID) | ORAL | Status: DC | PRN

## 2024-03-13 MED ORDER — PHENOL 1.4 % MT LIQD: 1.0000 | OROMUCOSAL | Status: DC | PRN

## 2024-03-13 MED ORDER — AMLODIPINE BESYLATE 10 MG PO TABS
10.0000 mg | ORAL_TABLET | Freq: Every day | ORAL | Status: DC
  Administered 2024-03-14: 10 mg via ORAL
  Filled 2024-03-13: qty 1

## 2024-03-13 MED ORDER — OXYCODONE HCL 5 MG/5ML PO SOLN: 5.0000 mg | Freq: Once | ORAL | Status: AC | PRN

## 2024-03-13 NOTE — Anesthesia Procedure Notes (Addendum)
 Spinal  Patient location during procedure: OR Start time: 03/13/2024 11:10 AM End time: 03/13/2024 11:18 AM Reason for block: surgical anesthesia  Staffing Performed: resident/CRNA and other anesthesia staff  Authorized by: Kradel, Brian K, MD   Performed by: Belinda, Summer, CRNA  Preanesthetic Checklist Completed: patient identified, IV checked, site marked, risks and benefits discussed, surgical consent, monitors and equipment checked and pre-op evaluation Spinal Block Patient position: sitting Prep: ChloraPrep Patient monitoring: heart rate, continuous pulse ox and blood pressure Approach: midline Location: L3-4 Injection technique: single-shot Needle Needle type: Pencan  Needle gauge: 24 G Needle length: 10 cm Assessment Sensory level: T10 Events: CSF return  Additional Notes IV functioning, monitors applied to pt. Expiration date of kit checked and confirmed to be in date. Sterile prep and drape, hand hygiene and sterile gloved used. Pt was positioned and spine was prepped in sterile fashion. Skin was anesthetized with lidocaine . Free flow of clear CSF obtained prior to injecting local anesthetic into CSF x 1 attempt. Spinal needle aspirated freely following injection. Needle was carefully withdrawn, and pt tolerated procedure well. Loss of motor and sensory on exam post injection.

## 2024-03-13 NOTE — Op Note (Signed)
 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  Components/Implants: Femur Persona Revision size 7 + with 5mm posterior augments med and lat with 16mm x156mm splined stem extension   Tibia Persona Revision Size D with central cone and 5mm medial and lateral augments, 6 mm offset 14x131mm splined stem extension  Poly 18mm CPS  Patella 32x8.47mm symmetric cemented  Date of Surgery: 03/13/2024  Surgeon: Arthea Sheer MD  Assistant: Debby Amber PA (present and scrubbed throughout the case, critical for assistance with exposure, retraction, instrumentation, and closure)   Anesthesiologist: Lanice  Anesthesia: Spinal   Total Tourniquet Time: 123 min  EBL: 100cc  IVF: 600cc  Complications: None Brief history: The patient is a 44 year old female with a history of left total knee replacement. The patient had increasing pain ongoing in their knee ever since  was worked up for painful total knee .  The patient was found to have loosening of the femoral and patella components of their knee confirmed on three-phase bone scan and CT scan .  She also has significant instability especially in flexion on physical exam.  Their knee underwent an aspiration and blood work-up which was negative for infection .  Despite multiple attempts at conservative management the patient had increasing pain and functional disability .   The risks and benefits of revision total knee arthroplasty as definitive surgical treatment were discussed with the patient, who opted to proceed with the operation.  All preoperative films were reviewed and an appropriate surgical plan was made prior to surgery. Preoperative range of motion was 0 to 115 and grossly unstable in flexion.     Description of procedure: The patient was brought to the  operating room where laterality was confirmed by all those present to be the left side.    Spinal anesthesia was administered and the patient received an intravenous dose of antibiotics for surgical prophylaxis and a dose of tranexamic acid .  Patient is positioned supine on the operating room table with all bony prominences well-padded.  A well-padded tourniquet was applied to the left thigh.  The knee was then prepped and draped in usual sterile fashion with multiple layers of adhesive and nonadhesive drapes.  All of those present in the operating room participated in a surgical timeout laterality and patient were confirmed.     An Esmarch was wrapped around the extremity and the leg was elevated and the knee flexed.  The tourniquet was inflated to a pressure of 275 mmHg. The Esmarch was removed and the leg was brought down to full extension.  The patella and tibial tubercle identified as well as the previous surgical incision and outlined using a marking pen and a full-thickness incision was made on the midline in continuation with the prior incision with a knife carried through the subcutaneous tissue down to the extensor retinaculum.  After exposure of the extensor mechanism the medial parapatellar arthrotomy was performed with a scalpel and electrocautery extending down medial and distal to the tibial tubercle taking care to avoid incising the patellar tendon.    A standard medial release was performed over the proximal tibia exposing the existing tibial baseplate and polyethylene.  The knee was brought into extension in order to excise the scar tissue around the patellar tendon with care taken to avoid injuring the patellar tendon .  The synovium was found to be  normal appearing with some chronic metallic staining around the edges of the implant.  Synovial fluid was found to be normal in appearance with no signs of infection.  A piece of synovium was removed and sent to pathology for evaluation under  frozen section and found to have no signs of inflammation.   Scar excision and synovectomy was performed in the medial lateral gutters to aid in visualization and exposure of the components.  The interface around the femoral component and the tibial component and bone were cleared using electrocautery and rongeurs.  The interface between the femoral component and the bone was worked with a flexible osteotomes and a Gigli saw.  The chamfer interfaces were exposed and worked with a flexible osteotome.  After circumferential release of the interface between the bone and the femoral component the femoral component was tamped off with minimal bone loss.   Attention was then turned to the tibial component the interface between the tibial component the bone was worked with an oscillating saw and flexible osteotomes.  The tibia was able to be debonded from the cement mantle and removed using a Shukla vice grip and hammer plate attachment.    Both the tibial and femoral canals were accessed at this time and were sequentially reamed up by hand reaching a size 14 mm reamer on the tibia with good fit and fill and a size 16 mm reamer on the femur with good fit and fill.  The tibia was then aligned with a cutting jig and a freshen up cut was made through the 0 hole.  Tibial baseplate size D was appropriately sized and fit well over the tibia with a 6 mm offset.  The tibia was also prepared at this time for a central cone due to central bone loss it was reamed and a small central cone trial was placed.  The trial tibia was placed and due to bone loss a 5 mm medial and lateral augment was attached to the tibial baseplate. A full tibial trial was then placed and attention was turned back to the femur. The femoral canal was tilted reamed and a size 7+ trial femur was put in place with freshen up cuts through the cut slots as needed. The 7+ size trial femur was placed which provided for good posterior offset.  The box was  opened with an oscillating saw and the trochlear component was placed.  A trial polyethylene was placed and the knee was taken through range of motion.  The knee was found to have good range of motion with full extension and flexion to 120 degrees with good stability through range of motion with a PS component.   The patella was evaluated and there was a large amount of bony overgrowth medial and laterally on both sides of the button.  The button was grossly loose to palpation and removed with no difficulty.  A freshen up cut was made along the patellar surface which was found to have been covered with fibrinous tissue along the interface between the old component and the bone.  Healthy new cut was taken down to fresh bone ensuring preservation of more than 14 mm of stock bone.  The patella was drilled for a new patella lugs offset from the old lug holes and all fibrinous tissue was removed. The knee was taken through range of motion with good patella tracking.  All the trial components were removed and placed on the back table to use with assembling the actual implants.  The correct final components for implantation were confirmed and opened by the circulator nurse.  The prepared surfaces of the  femur and tibia were cleaned with pulsatile lavage to remove all blood fat and other material and then the surfaces were dried.  3 bags of cement were mixed under vacuum and the components were cemented into place.  Excess cement was removed with curettes and forceps.  The final polyethylene tibial component was implanted and the knee was brought into full extension to allow the cement to set.  At this time the periarticular injection cocktail was placed in the soft tissues surrounding the knee.  The knee was then irrigated with copious amount of normal saline via pulsatile lavage to remove all loose bodies and other debris.  The knee was then irrigated with surgiphor betadine based wash and reirrigated with  saline.  The tourniquet was dropped and all bleeding vessels were identified and coagulated.  The arthrotomy was approximated with #1 Vicryl and closed with #2 Quill suture.  The knee was brought into slight flexion and the subcutaneous tissues were closed with 0 Vicryl, 2-0 Vicryl and a running subcuticular 4-0 barbed monocryl based suture.  Skin was then glued with Dermabond.  A sterile adhesive dressing was then placed along with a sequential compression device to the calf, a Ted stocking, and a cryotherapy cuff.     Sponge, needle, and Lap counts were all correct at the end of the case.   The patient was transferred off of the operating room table to a hospital bed, good pulses were found distally on the operative side.  The patient was transferred to the recovery room in stable condition.

## 2024-03-13 NOTE — Progress Notes (Signed)
 Patient is not able to walk the distance required to go the bathroom, or she is unable to safely negotiate stairs required to access the bathroom.  A 3in1 BSC will alleviate this problem.     Patient has mobility impairment for daily activities. A rolling walker will resolve this and the patient is safe to use it    T. Medford Amber, PA-C [facility]

## 2024-03-13 NOTE — Interval H&P Note (Signed)
 Patient history and physical updated. Consent reviewed including risks, benefits, and alternatives to surgery. Patient agrees with above plan to proceed with left knee revision all components.

## 2024-03-13 NOTE — Evaluation (Signed)
 Physical Therapy Evaluation Patient Details Name: Anna Barker MRN: [id number] DOB: [birth year] Today's Date: 03/13/2024  History of Present Illness  44 y/o female s/p L TKA 2/5, h/o R TKA 3 years ago.  Clinical Impression  Pt pleasant and eager to work with PT, she reports some ongoing low grade weakness/numbness that appears to be effecting R>L, pt had SLRs and good quad set and was wanting to try getting up and moving.  She ultimately did well with supine exercises and managed to walk >72ft, though she did have some general weakness with getting up to standing from low setting bed, but had no LOBs or overt issues.  Pt will benefit from continued PT to address functional limitations per TKA protocol.       If plan is discharge home, recommend the following: A little help with walking and/or transfers;A little help with bathing/dressing/bathroom;Assistance with cooking/housework;Help with stairs or ramp for entrance   Can travel by private vehicle        Equipment Recommendations Rolling walker (2 wheels);BSC/3in1  Recommendations for Other Services       Functional Status Assessment Patient has had a recent decline in their functional status and demonstrates the ability to make significant improvements in function in a reasonable and predictable amount of time.     Precautions / Restrictions Precautions Precautions: Knee;Fall Precaution Booklet Issued: Yes (comment) Recall of Precautions/Restrictions: Intact Restrictions Weight Bearing Restrictions Per Provider Order: Yes LLE Weight Bearing Per Provider Order: Weight bearing as tolerated      Mobility  Bed Mobility Overal bed mobility: Modified Independent                  Transfers Overall transfer level: Needs assistance Equipment used: Rolling walker (2 wheels) Transfers: Sit to/from Stand Sit to Stand: From elevated surface, Contact guard assist           General transfer comment: unable to rise from  standard height bed, CGA only from 2 elevated bed    Ambulation/Gait Ambulation/Gait assistance: Min assist Gait Distance (Feet): 60 Feet Assistive device: Rolling walker (2 wheels)         General Gait Details: Pt with some residual R LE numbness and needed to maintain locked out knees on both sides for weight acceptance, but had no LOBs.  Heavy, but expected, use of UEs on walker.  Stairs            Wheelchair Mobility     Tilt Bed    Modified Rankin (Stroke Patients Only)       Balance Overall balance assessment: Needs assistance   Sitting balance-Leahy Scale: Normal     Standing balance support: Bilateral upper extremity supported Standing balance-Leahy Scale: Fair                               Pertinent Vitals/Pain Pain Assessment Pain Assessment: 0-10 Pain Score: 6  Pain Location: L knee    Home Living Family/patient expects to be discharged to:: Private residence Living Arrangements: Children Available Help at Discharge: Available PRN/intermittently (77 year old son 24/7) Type of Home: House Home Access: Stairs to enter Entrance Stairs-Rails: None Entrance Stairs-Number of Steps: 1 Alternate Level Stairs-Number of Steps: flight Home Layout: Two level Home Equipment: None      Prior Function Prior Level of Function : Independent/Modified Independent;Driving             Mobility Comments: OOH, active ADLs Comments: independent  Extremity/Trunk Assessment   Upper Extremity Assessment Upper Extremity Assessment: Overall WFL for tasks assessed    Lower Extremity Assessment Lower Extremity Assessment: Generalized weakness (R LE still seemingly effected from block with less AROM than L, L LE functional per post-op expectations)       Communication   Communication Communication: No apparent difficulties    Cognition Arousal: Alert Behavior During Therapy: WFL for tasks assessed/performed   PT - Cognitive  impairments: No apparent impairments                         Following commands: Intact       Cueing Cueing Techniques: Verbal cues     General Comments      Exercises General Exercises - Lower Extremity Ankle Circles/Pumps: AROM, 10 reps Quad Sets: Strengthening, 10 reps Short Arc Quad: AROM, Strengthening, 10 reps Heel Slides: AROM, 10 reps (with resisted leg ext) Hip ABduction/ADduction: Strengthening, 10 reps Straight Leg Raises: AROM, 10 reps   Assessment/Plan    PT Assessment    PT Problem List         PT Treatment Interventions      PT Goals (Current goals can be found in the Care Plan section)  Acute Rehab PT Goals Patient Stated Goal: go home tomorrow PT Goal Formulation: With patient Time For Goal Achievement: 03/26/24 Potential to Achieve Goals: Fair    Frequency BID     Co-evaluation               AM-PAC PT 6 Clicks Mobility  Outcome Measure Help needed turning from your back to your side while in a flat bed without using bedrails?: None Help needed moving from lying on your back to sitting on the side of a flat bed without using bedrails?: None Help needed moving to and from a bed to a chair (including a wheelchair)?: A Little Help needed standing up from a chair using your arms (e.g., wheelchair or bedside chair)?: A Little Help needed to walk in hospital room?: A Little Help needed climbing 3-5 steps with a railing? : A Lot 6 Click Score: 19    End of Session Equipment Utilized During Treatment: Gait belt Activity Tolerance: Patient tolerated treatment well Patient left: in chair;with call bell/phone within reach Nurse Communication: Mobility status PT Visit Diagnosis: Muscle weakness (generalized) (M62.81);Difficulty in walking, not elsewhere classified (R26.2);Pain Pain - Right/Left: Left Pain - part of body: Knee    Time: 1710-1748 PT Time Calculation (min) (ACUTE ONLY): 38 min   Charges:   PT Evaluation $PT Eval  Low Complexity: 1 Low PT Treatments $Gait Training: 8-22 mins $Therapeutic Exercise: 8-22 mins PT General Charges $$ ACUTE PT VISIT: 1 Visit         Carmin JONELLE Deed, DPT 03/13/2024, 7:28 PM

## 2024-03-13 NOTE — Anesthesia Postprocedure Evaluation (Signed)
"   Anesthesia Post Note  Patient: Anna Barker  Procedure(s) Performed: TOTAL KNEE REVISION (Left: Knee)  Patient location during evaluation: PACU Anesthesia Type: MAC and Spinal Level of consciousness: oriented and awake and alert Pain management: pain level controlled Vital Signs Assessment: post-procedure vital signs reviewed and stable Respiratory status: spontaneous breathing, respiratory function stable and patient connected to nasal cannula oxygen Cardiovascular status: blood pressure returned to baseline and stable Postop Assessment: no headache, no backache and no apparent nausea or vomiting Anesthetic complications: no   No notable events documented.   Last Vitals:  Vitals:   03/13/24 1530 03/13/24 1545  BP: 118/83 123/79  Pulse: 69 67  Resp: 13 15  Temp:    SpO2: 95% 94%    Last Pain:  Vitals:   03/13/24 1604  TempSrc:   PainSc: 5                  [name]      "

## 2024-03-13 NOTE — H&P (Signed)
 History of Present Illness: The patient is an 44 y.o. female who is here for history and physical for left total knee revision March 13, 2024 with Dr. Lorelle. Patient underwent left total knee replacement in 2023 and did well. She later developed some increasing pain along the medial joint line with locking and instability that has progressed to the point where it has significantly interfered with her quality of life and activities daily living. Despite bracing, medications pain has been severe. She has had no improvement with physical therapy. Bone scan shows loosening of the total knee components. She is underwent aspiration showing no signs of infection. Patient's pain is 10 out of 10 on a daily basis and interfering with simple activities such as walking short distance and standing.  Patient is a non-smoker with an A1c of 5.9 and a BMI of 48  Past Medical History: Past Medical History:  Diagnosis Date  Arthritis  GERD (gastroesophageal reflux disease)  Hypertension  Osteoporosis   Past Surgical History: Past Surgical History:  Procedure Laterality Date  TUBAL LIGATION 2013  REPLACEMENT TOTAL KNEE Left 07/28/2021  [facility]  CHOLECYSTECTOMY   Past Family History: Family History  Problem Relation Age of Onset  Diabetes type II Mother  High blood pressure (Hypertension) Mother  Arthritis Mother  Breast cancer Mother  Alcohol abuse Father  Arthritis Father  Alcohol abuse Brother  Alcohol abuse Brother  Mental illness Maternal Grandmother  No Known Problems Maternal Grandfather  No Known Problems Paternal Grandmother  No Known Problems Paternal Grandfather   Medications: Current Outpatient Medications  Medication Sig Dispense Refill  amLODIPine  (NORVASC ) 10 MG tablet Take 1 tablet (10 mg total) by mouth once daily 90 tablet 3  celecoxib  (CELEBREX ) 200 MG capsule Take 1 capsule (200 mg total) by mouth once daily 30 capsule 0  ferrous sulfate 325 (65 FE) MG tablet Take 325 mg by  mouth once daily  hydroCHLOROthiazide  (HYDRODIURIL ) 25 MG tablet Take 1 tablet (25 mg total) by mouth once daily 90 tablet 3  magnesium  oxide (MAG-OX) 400 mg (241.3 mg magnesium ) tablet Take 400 mg by mouth once daily  omeprazole (PRILOSEC) 20 MG DR capsule Take 1 capsule by mouth once daily 30 capsule 5  acetaminophen  (TYLENOL ) 500 MG tablet Take 2 tablets (1,000 mg total) by mouth every 8 (eight) hours as needed for Pain 60 tablet 0  celecoxib  (CELEBREX ) 200 MG capsule Take 200 mg by mouth 2 (two) times daily (Patient not taking: Reported on 12/27/2023)  metFORMIN (GLUCOPHAGE-XR) 500 MG XR tablet Take 500 mg by mouth daily with dinner  traZODone  (DESYREL ) 100 MG tablet Take 1 tablet (100 mg total) by mouth at bedtime as needed for Sleep for up to 210 days (Patient not taking: Reported on 03/04/2024) 30 tablet 6  venlafaxine (EFFEXOR-XR) 75 MG XR capsule Take 1 capsule (75 mg total) by mouth once daily for 180 days In the morning with food (Patient not taking: Reported on 03/04/2024) 30 capsule 5   No current facility-administered medications for this visit.   Allergies: Allergies  Allergen Reactions  Gabapentin  Swelling  Pt states her ankles swell.    Review of Systems:  A comprehensive 14 point ROS was performed, reviewed, and the pertinent orthopaedic findings are documented in the HPI.  Physical Exam: General:  Well developed, well nourished, no apparent distress, normal affect, normal gait with mild antalgic gait.  HEENT: Head normocephalic, atraumatic, PERRL.   Abdomen: Soft, non tender, non distended, Bowel sounds present.  Heart: Examination of  the heart reveals regular, rate, and rhythm. There is no murmur noted on ascultation. There is a normal apical pulse.  Lungs: Lungs are clear to auscultation. There is no wheeze, rhonchi, or crackles. There is normal expansion of bilateral chest walls.   Comprehensive Knee Exam: Gait Antalgic on the left with a valgus thrust   Alignment Neutral   Inspection Left  Skin Normal appearance with no obvious deformity. No ecchymosis or erythema. Healed midline incision  Soft Tissue No focal soft tissue swelling  Quad Atrophy None   Palpation  Left  Tenderness Medial joint line tenderness to palpation and tenderness over the patellar tendon and parapatellar region  Crepitus Ball patellar clicking through range of motion  Effusion None   Range of Motion Left  Flexion 0-115  Extension Full knee extension without hyperextension   Ligamentous Exam Left  AP motion in flexion >1cm  Varus/Valgus 0 3-48mm with medial opening 2+ with endpoint  Varus/Valgus 90 3-62mm    Neurovascular Left  Quadriceps Strength 5/5  Hamstring Strength 5/5  Hip Abductor Strength 4/5  Distal Motor Normal  Distal Sensory Normal light touch sensation  Distal Pulses Normal    Imaging Studies: I reviewed AP, lateral and sunrise x-rays of the left knee performed on 09/12/2023 images reviewed by myself. Patient is status post cemented total knee arthroplasty with patellar resurfacing. The tibial component is in the several degrees of varus measuring about 8 to 10 degrees off of the neutral mechanical axis. Unchanged from prior films. The femur is in about 10 degrees of valgus compared to the femoral shaft unchanged from prior films. There is new evidence of osteolysis and bony fragmentation in cystic change on the patella compared to prior films concerning for possible patellar component loosening. Otherwise no fractures or periprosthetic injuries noted.   Bone scan performed of the left knee on 12/08/2023 images and report reviewed myself. Report noted below. There is delayed phase activity concerning for loosening of the left patella and left femoral components. Agree the radiologist interpretation.  Narrative & Impression  Procedure: Limited bone scan of the bilateral k Procedure: Regional three phase bone scan of the bilateral knees with  SPECT/CT.   Indication: Female, 44 years old. 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  Comparison: None.  Radiotracer: 27.5 mCi of Tc-35m MDP, IV.  Technique: Anterior and posterior planar dynamic blood flow images were acquired of the knees, followed by anterior and posterior planar, multiplanar immediate blood pool images of the same region, followed by anterior and posterior planar, multiplanar delayed images of the same region. . In addition, SPECT of the same area was performed with low dose CT for anatomic localization.    Findings: Patient is status post total left knee arthroplasty.  The dynamic blood flow images demonstrate symmetric perfusion with no significant focal areas of hyperemia.   The immediate blood pool images demonstrate asymmetric distribution of radiotracer with mildly increased activity in the left distal femur and left patella.   The delayed images with SPECT/CT demonstrate asymmetric radiotracer uptake with moderately increased tracer activity in the left distal femur and intense tracer activity at the left patellar interface. Intensely increased tracer activity at the right patellofemoral interface is likely related to degenerative changes. No abnormal uptake is identified along the left proximal tibia prosthesis interface.     Impression:  1. Scintigraphic evidence suggestive of hardware loosening in the left distal femoral and left patellar component. 2. No scintigraphic evidence of osteomyelitis. 3. Findings consistent with degenerative changes involving the right patellofemoral compartment.  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 Patient developed increasing pain swelling instability despite conservative treatment consisting of bracing physical therapy. Patient's pain interferes with quality of life and activities day living. Recent bone scans showed loosening of total knee components as well as aspiration showing no signs of any infection. Patient has seen Dr. Lorelle discussed treatment options and agreed and consented to a left total knee revision. Risks, benefits, complications of a left total knee revision have been discussed with the patient. Patient has agreed and consented procedure with Dr. Sophia on 03/13/2024.  The hospitalization and post-operative care and rehabilitation were also discussed. The use of perioperative antibiotics and DVT prophylaxis were discussed. The risk, benefits and alternatives to a surgical intervention were discussed at length with the patient. The patient was also advised of risks related to the medical comorbidities and elevated body mass index (BMI). A lengthy discussion took place to review the most common complications including but not limited to: stiffness, loss of function, complex regional pain syndrome, deep vein thrombosis, pulmonary embolus, heart attack, stroke, infection, wound breakdown, numbness, intraoperative fracture, damage to nerves, tendon,muscles, arteries or other blood vessels, death and other possible complications from anesthesia. The patient was told that we will take steps to minimize these risks by using sterile technique, antibiotics and DVT prophylaxis when appropriate and follow the patient postoperatively in the office setting to monitor progress. The possibility of recurrent pain, no improvement in pain and actual worsening of pain were also discussed with the  patient.   We did discuss the possibility of encountering infection during the surgery and if that were to occur that we would likely place an antibiotic spacer.

## 2024-03-13 NOTE — Anesthesia Preprocedure Evaluation (Signed)
"                                    Anesthesia Evaluation  Patient identified by MRN, date of birth, ID band Patient awake    Reviewed: Allergy & Precautions, H&P , NPO status , Patient's Chart, lab work & pertinent test results  Airway Mallampati: II  TM Distance: >3 FB Neck ROM: Full    Dental no notable dental hx.    Pulmonary sleep apnea , former smoker   Pulmonary exam normal breath sounds clear to auscultation       Cardiovascular hypertension, Normal cardiovascular exam Rhythm:Regular Rate:Normal     Neuro/Psych  PSYCHIATRIC DISORDERS Anxiety Depression     Neuromuscular disease    GI/Hepatic Neg liver ROS,GERD  ,,  Endo/Other  negative endocrine ROS    Renal/GU negative Renal ROS  negative genitourinary   Musculoskeletal negative musculoskeletal ROS (+)    Abdominal   Peds negative pediatric ROS (+)  Hematology  (+) Blood dyscrasia, anemia   Anesthesia Other Findings   Reproductive/Obstetrics negative OB ROS                              Anesthesia Physical Anesthesia Plan  ASA: 2  Anesthesia Plan: MAC and Spinal   Post-op Pain Management:    Induction: Intravenous  PONV Risk Score and Plan:   Airway Management Planned:   Additional Equipment:   Intra-op Plan:   Post-operative Plan: Extubation in OR  Informed Consent: I have reviewed the patients History and Physical, chart, labs and discussed the procedure including the risks, benefits and alternatives for the proposed anesthesia with the patient or authorized representative who has indicated his/her understanding and acceptance.     Dental advisory given  Plan Discussed with: CRNA  Anesthesia Plan Comments:         Anesthesia Quick Evaluation  "

## 2024-03-13 NOTE — Discharge Instructions (Signed)
 Instructions after Total Knee Replacement   Anna Barker M.D.     Dept. of Orthopaedics & Sports Medicine  [facility]  [address]  [place], [place]  [address]  Phone: [phone]   Fax: [phone]    DIET: Drink plenty of non-alcoholic fluids. Resume your normal diet. Include foods high in fiber.  ACTIVITY:  You may use crutches or a walker with weight-bearing as tolerated, unless instructed otherwise. You may be weaned off of the walker or crutches by your Physical Therapist.  Do NOT place pillows under the knee. Anything placed under the knee could limit your ability to straighten the knee.   Continue doing gentle exercises. Exercising will reduce the pain and swelling, increase motion, and prevent muscle weakness.   Please continue to use the TED compression stockings for 2 weeks. You may remove the stockings at night, but should reapply them in the morning. Do not drive or operate any equipment until instructed.  WOUND CARE:  Continue to use the PolarCare or ice packs periodically to reduce pain and swelling. You may begin showering 3 days after surgery with honeycomb dressing. Remove honeycomb dressing 7 days after surgery and continue showering. Allow dermabond to fall off on its own.  MEDICATIONS: You may resume your regular medications. Please take the pain medication as prescribed on the medication. Do not take pain medication on an empty stomach. You have been given a prescription for a blood thinner (Lovenox or Coumadin). Please take the medication as instructed. (NOTE: After completing a 2 week course of Lovenox, take one 81 mg Enteric-coated aspirin twice a day for 3 additional weeks. This along with elevation will help reduce the possibility of phlebitis in your operated leg.) Do not drive or drink alcoholic beverages when taking pain medications.  POSTOPERATIVE CONSTIPATION PROTOCOL Constipation - defined medically as fewer than three stools per  week and severe constipation as less than one stool per week.  One of the most common issues patients have following surgery is constipation.  Even if you have a regular bowel pattern at home, your normal regimen is likely to be disrupted due to multiple reasons following surgery.  Combination of anesthesia, postoperative narcotics, change in appetite and fluid intake all can affect your bowels.  In order to avoid complications following surgery, here are some recommendations in order to help you during your recovery period.  Colace (docusate) - Pick up an over-the-counter form of Colace or another stool softener and take twice a day as long as you are requiring postoperative pain medications.  Take with a full glass of water daily.  If you experience loose stools or diarrhea, hold the colace until you stool forms back up.  If your symptoms do not get better within 1 week or if they get worse, check with your doctor.  Dulcolax (bisacodyl) - Pick up over-the-counter and take as directed by the product packaging as needed to assist with the movement of your bowels.  Take with a full glass of water.  Use this product as needed if not relieved by Colace only.   MiraLax (polyethylene glycol) - Pick up over-the-counter to have on hand.  MiraLax is a solution that will increase the amount of water in your bowels to assist with bowel movements.  Take as directed and can mix with a glass of water, juice, soda, coffee, or tea.  Take if you go more than two days without a movement. Do not use MiraLax more than once per day.  Call your doctor if you are still constipated or irregular after using this medication for 7 days in a row.  If you continue to have problems with postoperative constipation, please contact the office for further assistance and recommendations.  If you experience "the worst abdominal pain ever" or develop nausea or vomiting, please contact the office immediatly for further recommendations for  treatment.   CALL THE OFFICE FOR: Temperature above 101 degrees Excessive bleeding or drainage on the dressing. Excessive swelling, coldness, or paleness of the toes. Persistent nausea and vomiting.  FOLLOW-UP:  You should have an appointment to return to the office in 14 days after surgery. Arrangements have been made for continuation of Physical Therapy (either home therapy or outpatient therapy).    POLAR CARE INFORMATION  MassAdvertisement.it  How to use Breg Polar Care [facility]?  YouTube   ShippingScam.co.uk  OPERATING INSTRUCTIONS  Start the product With dry hands, connect the transformer to the electrical connection located on the top of the cooler. Next, plug the transformer into an appropriate electrical outlet. The unit will automatically start running at this point.  To stop the pump, disconnect electrical power.  Unplug to stop the product when not in use. Unplugging the Polar Care unit turns it off. Always unplug immediately after use. Never leave it plugged in while unattended. Remove pad.    FIRST ADD WATER TO FILL LINE, THEN ICE---Replace ice when existing ice is almost melted  1 Discuss Treatment with your Licensed Health Care Practitioner and Use Only as Prescribed 2 Apply Insulation Barrier & Cold Therapy Pad 3 Check for Moisture 4 Inspect Skin Regularly  Tips and Trouble Shooting Usage Tips 1. Use cubed or chunked ice for optimal performance. 2. It is recommended to drain the Pad between uses. To drain the pad, hold the Pad upright with the hose pointed toward the ground. Depress the black plunger and allow water to drain out. 3. You may disconnect the Pad from the unit without removing the pad from the affected area by depressing the silver tabs on the hose coupling and gently pulling the hoses apart. The Pad and unit will seal itself and will not leak. Note: Some dripping during release is normal. 4. DO NOT RUN PUMP  WITHOUT WATER! The pump in this unit is designed to run with water. Running the unit without water will cause permanent damage to the pump. 5. Unplug unit before removing lid.  TROUBLESHOOTING GUIDE Pump not running, Water not flowing to the pad, Pad is not getting cold 1. Make sure the transformer is plugged into the wall outlet. 2. Confirm that the ice and water are filled to the indicated levels. 3. Make sure there are no kinks in the pad. 4. Gently pull on the blue tube to make sure the tube/pad junction is straight. 5. Remove the pad from the treatment site and ll it while the pad is lying at; then reapply. 6. Confirm that the pad couplings are securely attached to the unit. Listen for the double clicks (Figure 1) to confirm the pad couplings are securely attached.  Leaks    Note: Some condensation on the lines, controller, and pads is unavoidable, especially in warmer climates. 1. If using a Breg Polar Care Cold Therapy unit with a detachable Cold Therapy Pad, and a leak exists (other than condensation on the lines) disconnect the pad couplings. Make sure the silver tabs on the couplings are depressed before reconnecting the pad to the pump hose; then  confirm both sides of the coupling are properly clicked in. 2. If the coupling continues to leak or a leak is detected in the pad itself, stop using it and call Breg Customer Care at [phone].  Cleaning After use, empty and dry the unit with a soft cloth. Warm water and mild detergent may be used occasionally to clean the pump and tubes.  WARNING: The Polar Care Cube can be cold enough to cause serious injury, including full skin necrosis. Follow these Operating Instructions, and carefully read the Product Insert (see pouch on side of unit) and the Cold Therapy Pad Fitting Instructions (provided with each Cold Therapy Pad) prior to use.

## 2024-03-13 NOTE — Transfer of Care (Signed)
 Immediate Anesthesia Transfer of Care Note  Patient: Anna Barker  Procedure(s) Performed: TOTAL KNEE REVISION (Left: Knee)  Patient Location: PACU  Anesthesia Type:General  Level of Consciousness: awake, alert , oriented, and patient cooperative  Airway & Oxygen Therapy: Patient Spontanous Breathing and Patient connected to face mask oxygen  Post-op Assessment: Report given to RN, Post -op Vital signs reviewed and stable, and Patient moving all extremities X 4  Post vital signs: Reviewed and stable  Last Vitals:  Vitals Value Taken Time  BP 95/65 03/13/24 14:51  Temp 35.9 C 03/13/24 14:51  Pulse 82 03/13/24 14:52  Resp 22 03/13/24 14:52  SpO2 99 % 03/13/24 14:52  Vitals shown include unfiled device data.  Last Pain:  Vitals:   03/13/24 1451  PainSc: 0-No pain         Complications: No notable events documented.

## 2024-03-14 ENCOUNTER — Other Ambulatory Visit: Payer: Self-pay

## 2024-03-14 LAB — CBC
HCT: 31 % — ABNORMAL LOW (ref 36.0–46.0)
Hemoglobin: 9.6 g/dL — ABNORMAL LOW (ref 12.0–15.0)
MCH: 25.6 pg — ABNORMAL LOW (ref 26.0–34.0)
MCHC: 31 g/dL (ref 30.0–36.0)
MCV: 82.7 fL (ref 80.0–100.0)
Platelets: 333 10*3/uL (ref 150–400)
RBC: 3.75 MIL/uL — ABNORMAL LOW (ref 3.87–5.11)
RDW: 15 % (ref 11.5–15.5)
WBC: 11.2 10*3/uL — ABNORMAL HIGH (ref 4.0–10.5)
nRBC: 0 % (ref 0.0–0.2)

## 2024-03-14 LAB — AEROBIC/ANAEROBIC CULTURE W GRAM STAIN (SURGICAL/DEEP WOUND)
Culture: NO GROWTH
Gram Stain: NONE SEEN

## 2024-03-14 LAB — BASIC METABOLIC PANEL WITH GFR
Anion gap: 11 (ref 5–15)
BUN: 15 mg/dL (ref 6–20)
CO2: 25 mmol/L (ref 22–32)
Calcium: 9.1 mg/dL (ref 8.9–10.3)
Chloride: 101 mmol/L (ref 98–111)
Creatinine, Ser: 0.8 mg/dL (ref 0.44–1.00)
GFR, Estimated: >60 mL/min
Glucose, Bld: 117 mg/dL — ABNORMAL HIGH (ref 70–99)
Potassium: 4.3 mmol/L (ref 3.5–5.1)
Sodium: 136 mmol/L (ref 135–145)

## 2024-03-14 LAB — SURGICAL PATHOLOGY

## 2024-03-14 MED ORDER — OXYCODONE HCL 5 MG PO TABS
5.0000 mg | ORAL_TABLET | Freq: Four times a day (QID) | ORAL | 0 refills | Status: AC | PRN
  Filled 2024-03-14: qty 30, 7d supply, fill #0

## 2024-03-14 MED ORDER — ONDANSETRON HCL 4 MG PO TABS
4.0000 mg | ORAL_TABLET | Freq: Four times a day (QID) | ORAL | 0 refills | Status: AC | PRN
  Filled 2024-03-14: qty 20, 5d supply, fill #0

## 2024-03-14 MED ORDER — ENOXAPARIN SODIUM 40 MG/0.4ML IJ SOSY
40.0000 mg | PREFILLED_SYRINGE | INTRAMUSCULAR | 0 refills | Status: AC
  Filled 2024-03-14: qty 5.6, 14d supply, fill #0

## 2024-03-14 MED ORDER — HYDROCODONE-ACETAMINOPHEN 5-325 MG PO TABS
ORAL_TABLET | ORAL | Status: AC
  Filled 2024-03-14: qty 2

## 2024-03-14 MED ORDER — ACETAMINOPHEN 500 MG PO TABS
1000.0000 mg | ORAL_TABLET | Freq: Three times a day (TID) | ORAL | 0 refills | Status: AC
  Filled 2024-03-14: qty 30, 5d supply, fill #0

## 2024-03-14 MED ORDER — TRAMADOL HCL 50 MG PO TABS
50.0000 mg | ORAL_TABLET | Freq: Four times a day (QID) | ORAL | 0 refills | Status: AC | PRN
  Filled 2024-03-14: qty 30, 7d supply, fill #0

## 2024-03-14 MED ORDER — CELECOXIB 200 MG PO CAPS
200.0000 mg | ORAL_CAPSULE | Freq: Every day | ORAL | 0 refills | Status: AC
  Filled 2024-03-14: qty 14, 14d supply, fill #0

## 2024-03-14 MED ORDER — SENNA 8.6 MG PO TABS
1.0000 | ORAL_TABLET | Freq: Every day | ORAL | 0 refills | Status: AC
  Filled 2024-03-14: qty 30, 30d supply, fill #0

## 2024-03-14 NOTE — TOC Transition Note (Signed)
 Transition of Care [facility]) - Discharge Note   Patient Details  Name: Anna Barker MRN: [id number] Date of Birth: 29-Dec-1980  [facility] CM/SW Contact:  Victory Jackquline RAMAN, RN Phone Number: 03/14/2024, 1:07 PM   Clinical Narrative:    RNCM met with the patient at the bedside, introduced myself and my role and explained that discharge planning would be discussed. Patient discharging home with [facility] for PT/OT that was arranged by Dr. Mal office. She stated that [facility] had called her already and will be coming out to see her on Saturday. DME order placed with Mitch with [employer] for RW and BSC to be delivered to bedside. RNCM received a call back from Ventress with [employer] informing me that the patient already had a RW and BSC given to her June 2023 and the insurance will not cover another one and they can't offer private pay because she has a Chief of staff. Patient made aware and verbalized understanding. She states that her mom has a walker at her house and that she will have her sister pick it up prior to coming to pick her up at the time of discharge.  Pt has discharge orders, no further concerns. RNCM signing off.   Final next level of care: Home w Home Health Services Barriers to Discharge: Barriers Resolved   Patient Goals and CMS Choice            Discharge Placement                Patient to be transferred to facility by: Sister Name of family member notified: Cecelia Patient and family notified of of transfer: 03/14/24  Discharge Plan and Services Additional resources added to the After Visit Summary for                                       Social Drivers of Health (SDOH) Interventions SDOH Screenings   Food Insecurity: No Food Insecurity (03/13/2024)  Housing: Low Risk (03/14/2024)  Transportation Needs: No Transportation Needs (03/13/2024)  Utilities: Patient Declined (03/13/2024)  Depression (PHQ2-9): Low Risk (04/11/2023)  Financial  Resource Strain: Low Risk  (03/04/2024)   Received from [facility]  Physical Activity: Inactive (08/09/2023)   Received from [facility]  Social Connections: Moderately Isolated (08/09/2023)   [facility]  Stress: Stress Concern Present (08/09/2023)   Received from [facility]  Tobacco Use: Medium Risk (03/13/2024)  Health Literacy: Adequate Health Literacy (08/09/2023)   Received from [facility]     Readmission Risk Interventions     No data to display
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
## 2024-03-14 NOTE — Progress Notes (Signed)
 Physical Therapy Treatment Patient Details Name: Anna Barker MRN: [id number] DOB: 11/26/1980 Today's Date: 03/14/2024   History of Present Illness 44 y/o female s/p L TKA 2/5, h/o R TKA 3 years ago.    PT Comments  Pt was long sitting in bed upon arrival. She is A and O x 4 + agreeable to session. Known by dino from previous SX. Pt puts forth great effort and tolerated session well. She demonstrated safe abilities to exit bed, stand to RW, and tolerate ambulation > 200 ft. Pt also safely demonstrated abilities to ascend/descend stairs to simulate home entry exit. Pt states confidence in safe DC home with HHPT.    If plan is discharge home, recommend the following: A little help with walking and/or transfers;A little help with bathing/dressing/bathroom;Assistance with cooking/housework;Help with stairs or ramp for entrance     Equipment Recommendations  Rolling walker (2 wheels);BSC/3in1       Precautions / Restrictions Precautions Precautions: Knee;Fall Precaution Booklet Issued: Yes (comment) Recall of Precautions/Restrictions: Intact Restrictions Weight Bearing Restrictions Per Provider Order: Yes LLE Weight Bearing Per Provider Order: Weight bearing as tolerated     Mobility  Bed Mobility Overal bed mobility: Modified Independent   Transfers Overall transfer level: Needs assistance Equipment used: Rolling walker (2 wheels) Transfers: Sit to/from Stand Sit to Stand: Supervision  General transfer comment: no physical assistance to stand from minimally elevated bed height    Ambulation/Gait Ambulation/Gait assistance: Supervision Gait Distance (Feet): 220 Feet Assistive device: Rolling walker (2 wheels) Gait Pattern/deviations: Step-through pattern, Step-to pattern, Antalgic Gait velocity: decreased  General Gait Details: Pt was able to ambulate ~ 220 ft with RW without LOB or safety concern. Vcs for posture correction and improved heel strike to toe off  sequencing   Stairs Stairs: Yes Stairs assistance: Supervision Stair Management: Backwards, Forwards, Step to pattern, One rail Right, No rails Number of Stairs: 4 General stair comments: Pt performed ascending/descending single stair to simulate home entry without rails and then performed ascending descending stairs with +1 UE support    Balance Overall balance assessment: Needs assistance Sitting-balance support: Feet supported Sitting balance-Leahy Scale: Normal     Standing balance support: Bilateral upper extremity supported, During functional activity, Reliant on assistive device for balance Standing balance-Leahy Scale: Good Standing balance comment: no LOB with BUE support on RW during dynamic standing balance       Communication Communication Communication: No apparent difficulties  Cognition Arousal: Alert Behavior During Therapy: WFL for tasks assessed/performed   PT - Cognitive impairments: No apparent impairments    PT - Cognition Comments: Pt is A and O x 4 Following commands: Intact      Cueing Cueing Techniques: Verbal cues     General Comments General comments (skin integrity, edema, etc.): REviewed importance of ROM, HEP, routine mobility, and use of polar care. Pt states understanding and confidence in safe abilities. Discussed car transfers and DME recs      Pertinent Vitals/Pain Pain Assessment Pain Assessment: 0-10 Pain Score: 6  Pain Location: L knee Pain Descriptors / Indicators: Discomfort, Operative site guarding, Grimacing Pain Intervention(s): Limited activity within patient's tolerance, Monitored during session, Premedicated before session, Repositioned, Ice applied     PT Goals (current goals can now be found in the care plan section) Acute Rehab PT Goals Patient Stated Goal: go home tomorrow Progress towards PT goals: Progressing toward goals    Frequency    BID       AM-PAC PT 6 Clicks Mobility  Outcome Measure  Help  needed turning from your back to your side while in a flat bed without using bedrails?: None Help needed moving from lying on your back to sitting on the side of a flat bed without using bedrails?: None Help needed moving to and from a bed to a chair (including a wheelchair)?: None Help needed standing up from a chair using your arms (e.g., wheelchair or bedside chair)?: A Little Help needed to walk in hospital room?: A Little Help needed climbing 3-5 steps with a railing? : A Little 6 Click Score: 21    End of Session   Activity Tolerance: Patient tolerated treatment well;Patient limited by pain Patient left: in chair;with call bell/phone within reach Nurse Communication: Mobility status PT Visit Diagnosis: Muscle weakness (generalized) (M62.81);Difficulty in walking, not elsewhere classified (R26.2);Pain Pain - Right/Left: Left Pain - part of body: Knee     Time: 0728-0752 PT Time Calculation (min) (ACUTE ONLY): 24 min  Charges:    $Gait Training: 8-22 mins $Therapeutic Activity: 8-22 mins PT General Charges $$ ACUTE PT VISIT: 1 Visit                     Rankin Essex PTA 03/14/24, 8:38 AM

## 2024-03-14 NOTE — Progress Notes (Signed)
" ° °  Subjective: 1 Day Post-Op Procedures (LRB): TOTAL KNEE REVISION (Left) Patient reports pain as mild.   Patient is well, and has had no acute complaints or problems Denies any CP, SOB, ABD pain. We will continue therapy today.  Plan is to go Home after hospital stay.  Objective: Vital signs in last 24 hours: Temp:  [96.6 F (35.9 C)-98.1 F (36.7 C)] 97.6 F (36.4 C) (02/06 0537) Pulse Rate:  [67-102] 81 (02/06 0537) Resp:  [13-18] 15 (02/06 0537) BP: (95-127)/(65-100) 109/68 (02/06 0537) SpO2:  [94 %-100 %] 94 % (02/06 0537) Weight:  [146.1 kg] 146.1 kg (02/05 1503)  Intake/Output from previous day: 02/05 0701 - 02/06 0700 In: 2200 [I.V.:1700; IV Piggyback:500] Out: 500 [Urine:400; Blood:100] Intake/Output this shift: No intake/output data recorded.  Recent Labs    03/14/24 0632  HGB 9.6*   Recent Labs    03/14/24 0632  WBC 11.2*  RBC 3.75*  HCT 31.0*  PLT 333   Recent Labs    03/14/24 0632  NA 136  K 4.3  CL 101  CO2 25  BUN 15  CREATININE 0.80  GLUCOSE 117*  CALCIUM 9.1   No results for input(s): LABPT, INR in the last 72 hours.  EXAM General - Patient is Alert, Appropriate, and Oriented Extremity - Neurovascular intact Sensation intact distally Intact pulses distally Dorsiflexion/Plantar flexion intact No cellulitis present Compartment soft Dressing - dressing C/D/I and no drainage Motor Function - intact, moving foot and toes well on exam.   Past Medical History:  Diagnosis Date   Anemia    Anxiety    Arthritis    Depression    GERD (gastroesophageal reflux disease)    Hypertension    Sleep apnea     Assessment/Plan:   1 Day Post-Op Procedures (LRB): TOTAL KNEE REVISION (Left) Principal Problem:   S/P revision of total knee, left  Estimated body mass index is 50.43 kg/m as calculated from the following:   Height as of this encounter: 5' 7 (1.702 m).   Weight as of this encounter: 146.1 kg. Advance diet Up with  therapy Patient doing well.  Pain well-controlled Labs and vital signs are stable Care management to assist with discharge to home with home health PT today.  DVT Prophylaxis - Lovenox , TED hose, and SCDs Weight-Bearing as tolerated to left leg   T. Medford Amber, PA-C [facility] 03/14/2024, 7:57 AM   "

## 2024-03-14 NOTE — Plan of Care (Signed)
Education adequate for discharge 

## 2024-04-09 IMAGING — CT CT KNEE*L* W/O CM
4 of 8 series · 12 of 33 positions shown, 13 images · non-contrast
Comparison: None.

CLINICAL DATA: Left knee pain.

EXAM:
CT OF THE LEFT KNEE WITHOUT CONTRAST
TECHNIQUE: Multidetector CT imaging of the left knee was performed according to
the standard protocol. Multiplanar CT image reconstructions were
also generated.
RADIATION DOSE REDUCTION: This exam was performed according to the
departmental dose-optimization program which includes automated
exposure control, adjustment of the mA and/or kV according to
patient size and/or use of iterative reconstruction technique.

[Series 4: axial bone · axial · 0.39mm/px · z∈[-359,-209]mm · 3 of 300 slices shown, 4 images]
[im 75/300  soft-tissue]
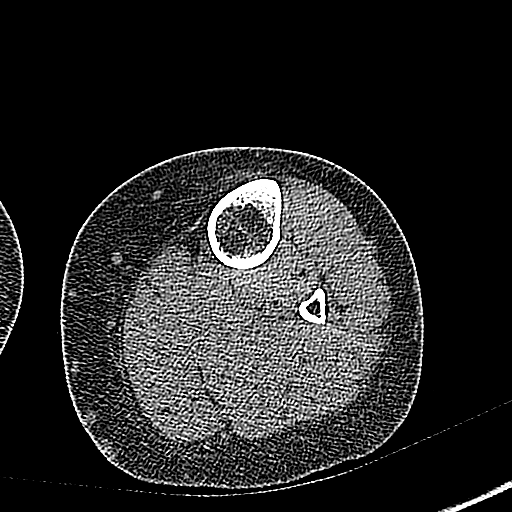
[im 75/300  bone]
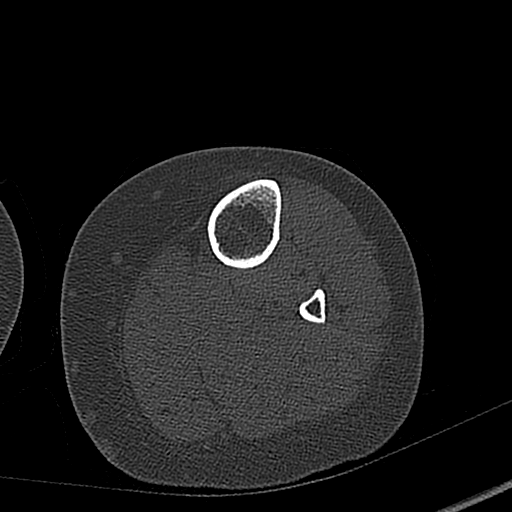
[im 150/300  bone]
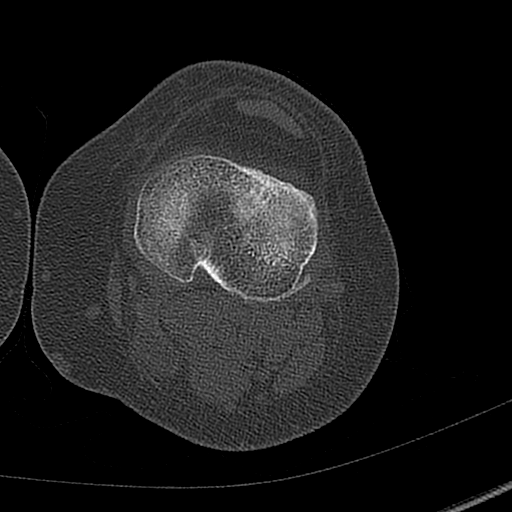
[im 225/300  bone]
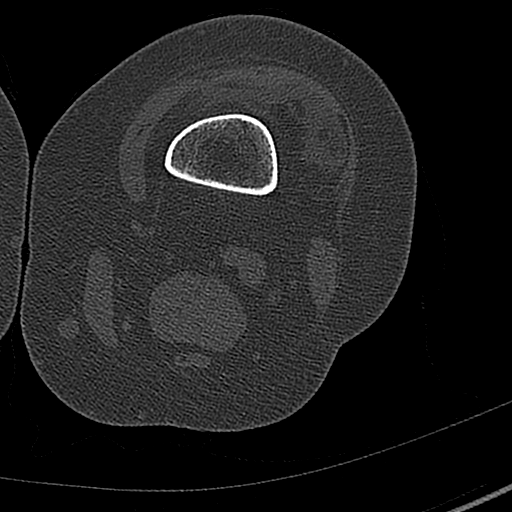

[Series 7: axial st · axial · 0.39mm/px · z∈[-359,-209]mm · 3 of 300 slices shown]
[im 75/300  bone]
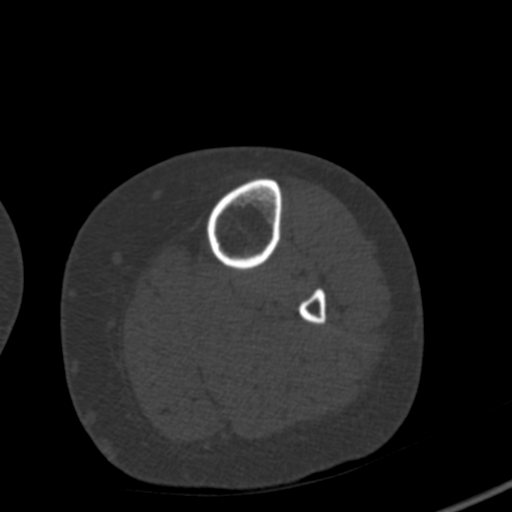
[im 150/300  bone]
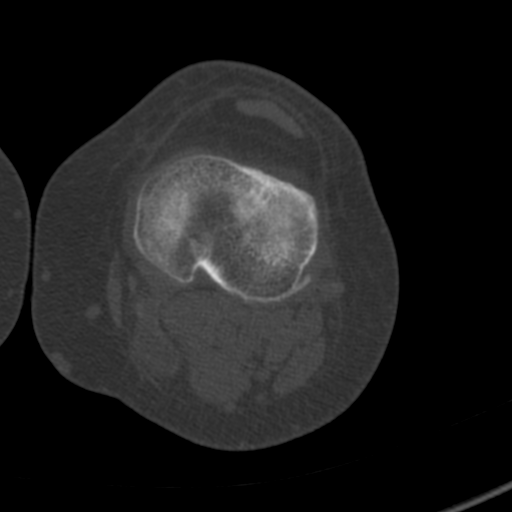
[im 225/300  bone]
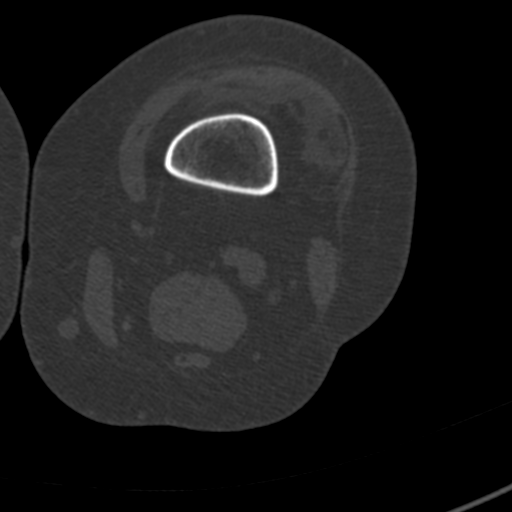

[Series 8: coronal bone · coronal · 0.26mm/px · 1 of 92 slices shown]
[im 46/92  bone]
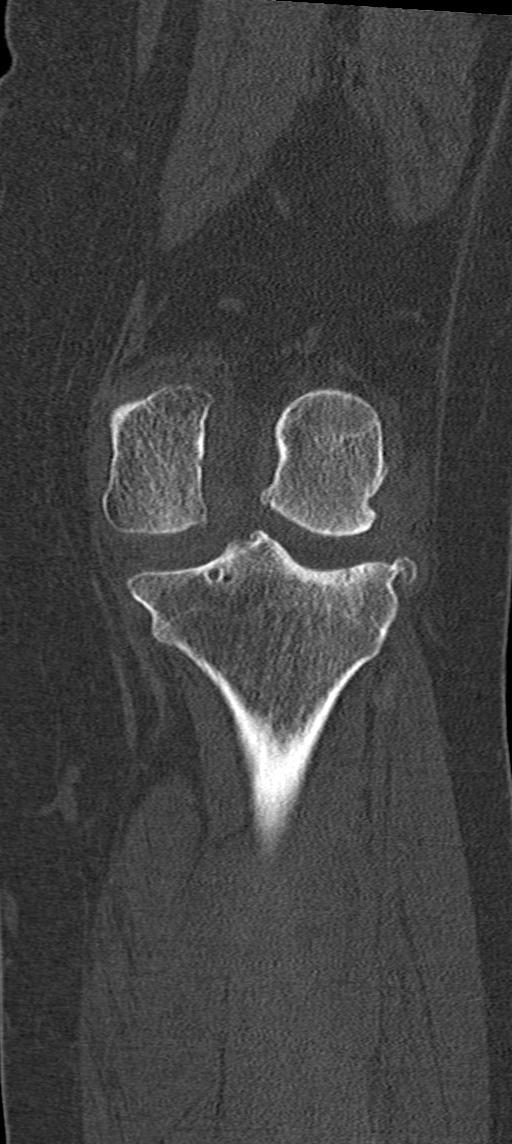

[Series 9: sagittal bone · sagittal · 0.36mm/px · 5 of 67 slices shown]
[im 12/67  bone]
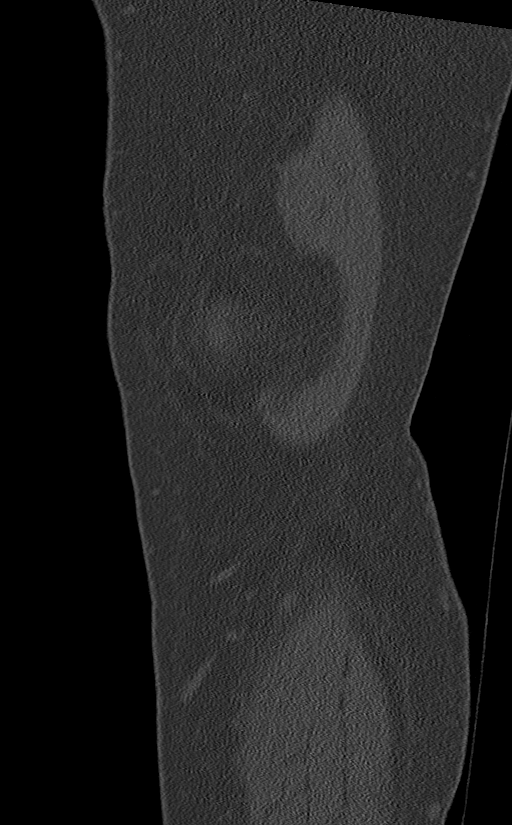
[im 23/67  bone]
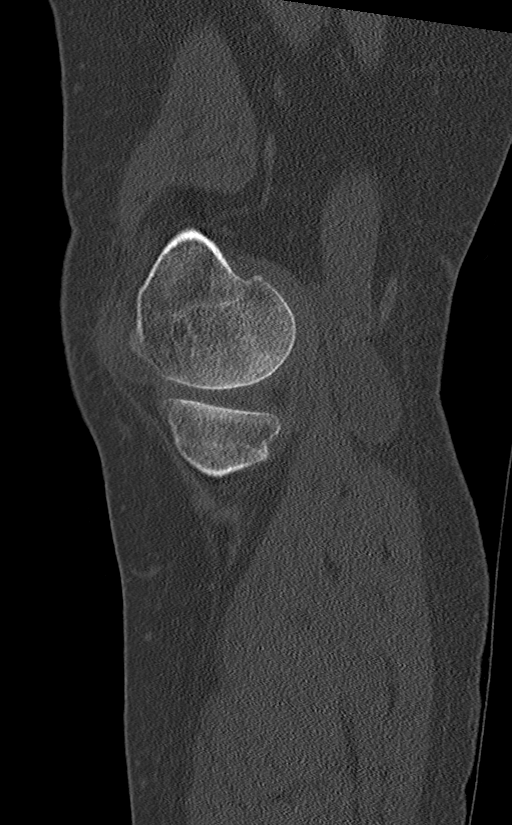
[im 34/67  bone]
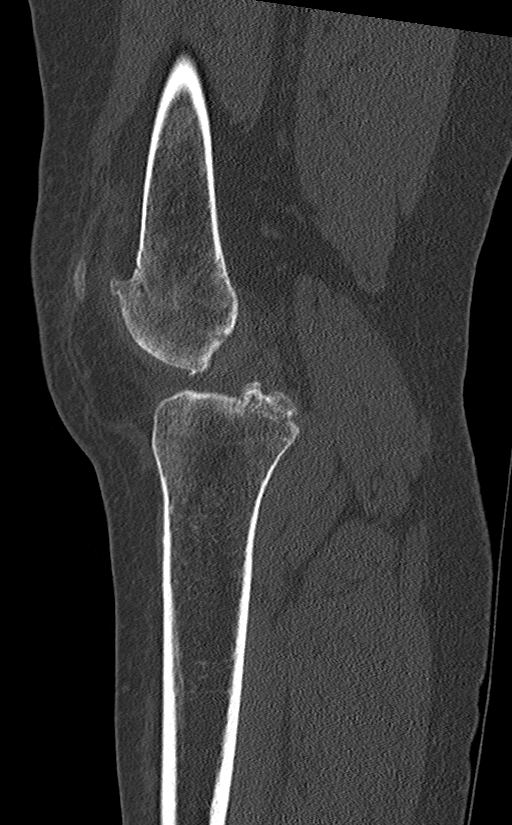
[im 45/67  bone]
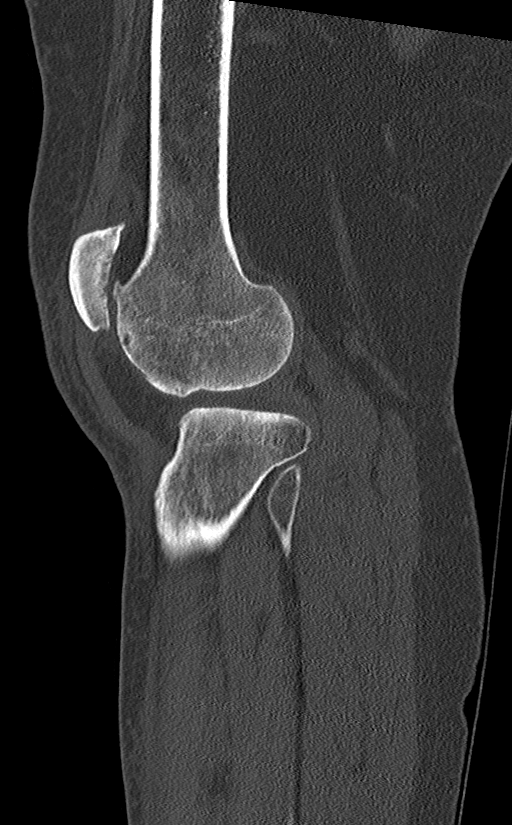
[im 56/67  bone]
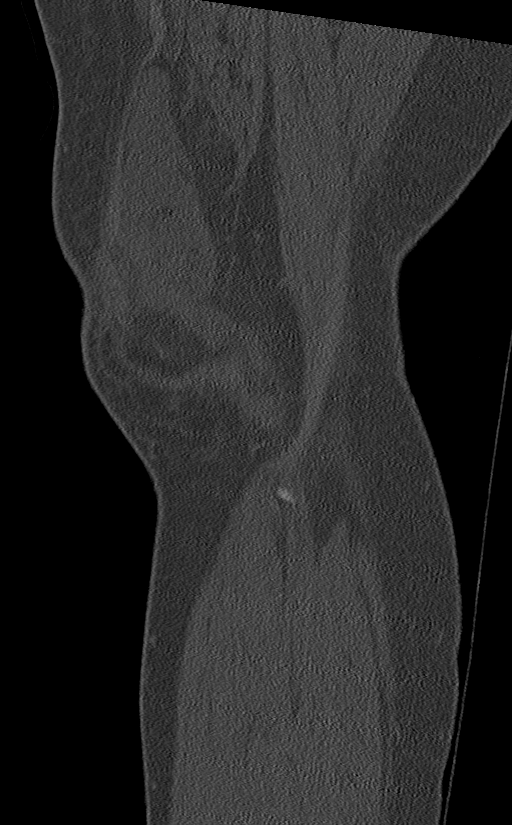

[12 of 33 positions shown; findings below may reference images not displayed]

FINDINGS: Bones/Joint/Cartilage

Hip demonstrates no fracture or dislocation. No lytic or blastic
lesion.

Knee demonstrates no fracture or dislocation. No lytic or blastic
lesion. Severe osteoarthritis of the lateral femorotibial
compartment with marginal osteophytes. Severe lateral patellofemoral
compartment osteoarthritis. Small joint effusion. Small Baker's
cyst.

Ankle demonstrates no fracture or dislocation. No lytic or blastic
lesion.

Ligaments

Ligaments are suboptimally evaluated by CT.

Muscles and Tendons
Muscles are normal. No muscle atrophy. No intramuscular fluid
collection or hematoma. Quadriceps tendon and patellar tendon are
intact.

Soft tissue
No fluid collection or hematoma.  No soft tissue mass.
IMPRESSION: 1. Severe osteoarthritis of the lateral femorotibial compartment and
patellofemoral compartment of the left knee.

## 2024-06-10 IMAGING — DX DG KNEE 1-2V*L*
2 series · 2 of 2 positions shown · non-contrast
Comparison: None Available.

CLINICAL DATA: Status post left knee total arthroplasty

EXAM:
LEFT KNEE - 1-2 VIEW

[knee ap]
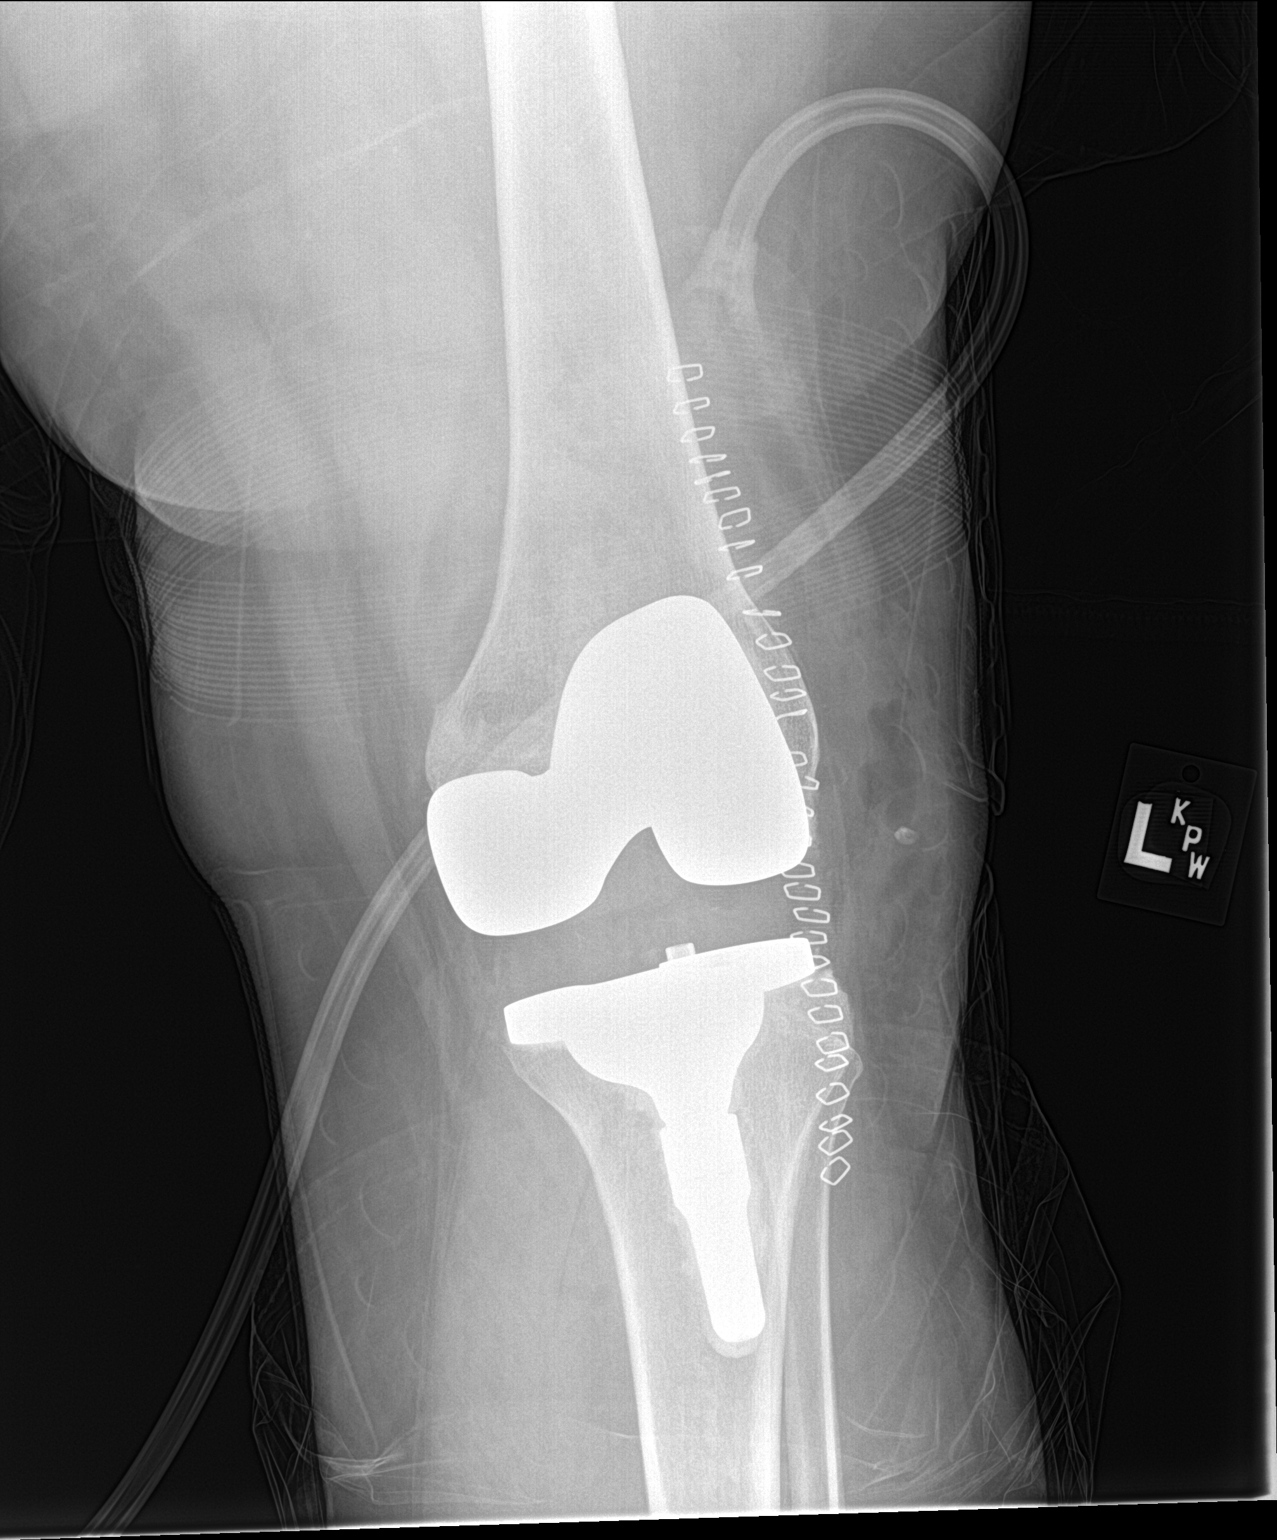

[knee lat]
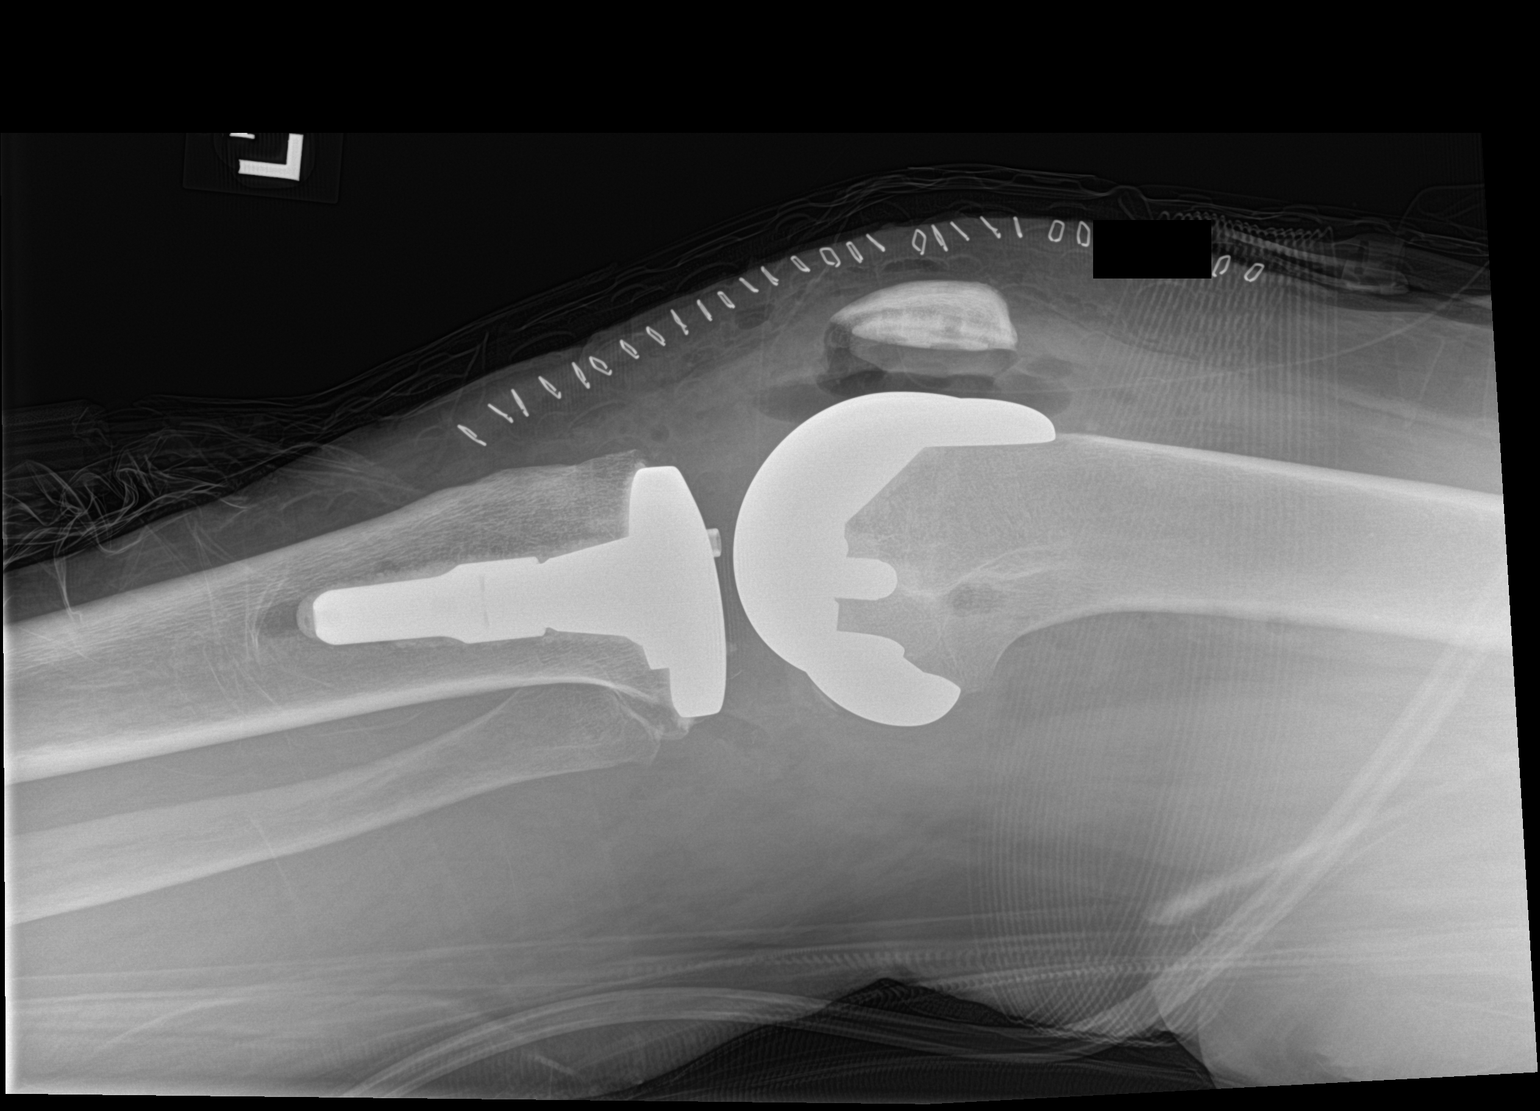

[2 of 2 positions shown; findings below may reference images not displayed]

FINDINGS: Status post left knee total arthroplasty with expected overlying
postoperative change. No evidence of perihardware fracture or
component malpositioning.
IMPRESSION: Status post left knee total arthroplasty with expected overlying
postoperative change. No evidence of perihardware fracture or
component malpositioning.
